# Patient Record
Sex: Male | Born: 1985 | Race: White | Hispanic: No | Smoking: Former smoker
Health system: Southern US, Community
[De-identification: ages and names within clinical notes are randomized; demographics above are authoritative.]

## PROBLEM LIST (undated history)

## (undated) DIAGNOSIS — G4700 Insomnia, unspecified: Secondary | ICD-10-CM

## (undated) DIAGNOSIS — G43909 Migraine, unspecified, not intractable, without status migrainosus: Secondary | ICD-10-CM

## (undated) DIAGNOSIS — E785 Hyperlipidemia, unspecified: Secondary | ICD-10-CM

## (undated) DIAGNOSIS — F1020 Alcohol dependence, uncomplicated: Secondary | ICD-10-CM

## (undated) HISTORY — PX: INNER EAR SURGERY: SHX679

## (undated) HISTORY — PX: TONSILLECTOMY: SUR1361

---
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
## 2007-07-20 ENCOUNTER — Emergency Department (HOSPITAL_COMMUNITY): Admission: EM | Admit: 2007-07-20 | Discharge: 2007-07-20 | Payer: Self-pay | Admitting: Emergency Medicine

## 2008-07-02 ENCOUNTER — Encounter: Admission: RE | Admit: 2008-07-02 | Discharge: 2008-07-02 | Payer: Self-pay | Admitting: Ophthalmology

## 2008-07-04 ENCOUNTER — Emergency Department (HOSPITAL_COMMUNITY): Admission: EM | Admit: 2008-07-04 | Discharge: 2008-07-04 | Payer: Self-pay | Admitting: Emergency Medicine

## 2009-11-28 ENCOUNTER — Emergency Department (HOSPITAL_COMMUNITY): Admission: EM | Admit: 2009-11-28 | Discharge: 2009-11-28 | Payer: Self-pay | Admitting: Emergency Medicine

## 2010-12-21 ENCOUNTER — Emergency Department (HOSPITAL_COMMUNITY)
Admission: EM | Admit: 2010-12-21 | Discharge: 2010-12-21 | Disposition: A | Discharge status: Home / Self Care | Payer: Self-pay | Attending: Emergency Medicine | Admitting: Emergency Medicine

## 2010-12-21 DIAGNOSIS — K089 Disorder of teeth and supporting structures, unspecified: Secondary | ICD-10-CM | POA: Insufficient documentation

## 2010-12-21 DIAGNOSIS — K047 Periapical abscess without sinus: Secondary | ICD-10-CM | POA: Insufficient documentation

## 2011-12-14 ENCOUNTER — Emergency Department (HOSPITAL_COMMUNITY)
Admission: EM | Admit: 2011-12-14 | Discharge: 2011-12-14 | Disposition: A | Discharge status: Home / Self Care | Payer: Self-pay | Attending: Emergency Medicine | Admitting: Emergency Medicine

## 2011-12-14 ENCOUNTER — Encounter (HOSPITAL_COMMUNITY): Payer: Self-pay | Admitting: Emergency Medicine

## 2011-12-14 DIAGNOSIS — R1011 Right upper quadrant pain: Secondary | ICD-10-CM | POA: Insufficient documentation

## 2011-12-14 DIAGNOSIS — R197 Diarrhea, unspecified: Secondary | ICD-10-CM | POA: Insufficient documentation

## 2011-12-14 DIAGNOSIS — R112 Nausea with vomiting, unspecified: Secondary | ICD-10-CM | POA: Insufficient documentation

## 2011-12-14 LAB — COMPREHENSIVE METABOLIC PANEL
Albumin: 4.9 g/dL (ref 3.5–5.2)
Calcium: 9.5 mg/dL (ref 8.4–10.5)
Creatinine, Ser: 0.8 mg/dL (ref 0.50–1.35)
GFR calc non Af Amer: >90 mL/min (ref >90)
Glucose, Bld: 68 mg/dL — ABNORMAL LOW (ref 70–99)
Sodium: 138 mEq/L (ref 135–145)

## 2011-12-14 LAB — DIFFERENTIAL
Basophils Absolute: 0 10*3/uL (ref 0.0–0.1)
Basophils Relative: 0 % (ref 0–1)
Lymphocytes Relative: 37 % (ref 12–46)
Monocytes Relative: 7 % (ref 3–12)
Neutro Abs: 4.9 10*3/uL (ref 1.7–7.7)

## 2011-12-14 LAB — CBC
HCT: 48.7 % (ref 39.0–52.0)
Hemoglobin: 16.8 g/dL (ref 13.0–17.0)
RBC: 5.37 MIL/uL (ref 4.22–5.81)
RDW: 12.8 % (ref 11.5–15.5)
WBC: 9.1 10*3/uL (ref 4.0–10.5)

## 2011-12-14 MED ORDER — SODIUM CHLORIDE 0.9 % IV BOLUS (SEPSIS)
1000.0000 mL | Freq: Once | INTRAVENOUS | Status: AC
  Administered 2011-12-14: 1000 mL via INTRAVENOUS

## 2011-12-14 MED ORDER — MORPHINE SULFATE 4 MG/ML IJ SOLN
4.0000 mg | Freq: Once | INTRAMUSCULAR | Status: AC
  Administered 2011-12-14: 4 mg via INTRAVENOUS
  Filled 2011-12-14: qty 1

## 2011-12-14 MED ORDER — ONDANSETRON HCL 4 MG/2ML IJ SOLN
4.0000 mg | Freq: Once | INTRAMUSCULAR | Status: AC
  Administered 2011-12-14: 4 mg via INTRAVENOUS
  Filled 2011-12-14: qty 2

## 2011-12-14 MED ORDER — HYDROCODONE-ACETAMINOPHEN 5-325 MG PO TABS: 2.0000 | ORAL_TABLET | ORAL | Status: AC | PRN

## 2011-12-14 MED ORDER — PROMETHAZINE HCL 25 MG PO TABS: 25.0000 mg | ORAL_TABLET | Freq: Four times a day (QID) | ORAL | Status: DC | PRN

## 2011-12-14 NOTE — ED Notes (Signed)
Pt presenting to ed with c/o right side abdominal pain. Pt states nausea and vomiting x 3 weeks. Pt states intermittent diarrhea. Pt is alert and oriented at this time.

## 2011-12-14 NOTE — ED Notes (Signed)
Pt. With nausea and vomiting on and off for three weeks.  Pt. Started today with sharp pain on the right upper abdominal area.  Pt. Reports intermittent diarrhea.  Reports vomit is whitish in color.

## 2011-12-14 NOTE — ED Provider Notes (Signed)
History     CSN: [id number]  Arrival date & time 12/14/11  1539   First MD Initiated Contact with Patient 12/14/11 1549      Chief Complaint  Patient presents with  . Abdominal Pain    (Consider location/radiation/quality/duration/timing/severity/associated sxs/prior treatment) HPI Comments: Patient reports that he has had intermittent vomiting over the past 2-3 weeks.  He also reports intermittent diarrhea over the past 2-3 weeks.  He denies any blood in his emesis or blood in his stool.  He reports that the vomiting is typically associated with eating.  Today he began having RUQ pain.  Pain does not radiate and is becoming progressively worse.  He reports that he has never had pain like this before.  He does have a family history of gallbladder disease. He also reports that he drinks approximately 24 cans of beer/week.   Patient is a 26 y.o. male presenting with abdominal pain. The history is provided by the patient.  Abdominal Pain The primary symptoms of the illness include abdominal pain, nausea, vomiting and diarrhea. The primary symptoms of the illness do not include fever, shortness of breath, hematemesis or dysuria. The onset of the illness was gradual.  Symptoms associated with the illness do not include chills, diaphoresis, constipation, hematuria or back pain. Significant associated medical issues do not include PUD, GERD, inflammatory bowel disease, gallstones or liver disease.    History reviewed. No pertinent past medical history.  Past Surgical History  Procedure Date  . Tonsillectomy   . Inner ear surgery     No family history on file.  History  Substance Use Topics  . Smoking status: Never Smoker   . Smokeless tobacco: Not on file  . Alcohol Use: 14.4 oz/week    24 Cans of beer per week     weekly      Review of Systems  Constitutional: Negative for fever, chills, diaphoresis and appetite change.  Respiratory: Negative for shortness of breath.     Cardiovascular: Negative for chest pain.  Gastrointestinal: Positive for nausea, vomiting, abdominal pain and diarrhea. Negative for constipation, blood in stool, abdominal distention and hematemesis.  Genitourinary: Negative for dysuria, hematuria, decreased urine volume, difficulty urinating and testicular pain.  Musculoskeletal: Negative for back pain.  Neurological: Negative for dizziness, syncope and light-headedness.    Allergies  Review of patient's allergies indicates no known allergies.  Home Medications   Current Outpatient Rx  Name Route Sig Dispense Refill  . IBUPROFEN 200 MG PO TABS Oral Take 800 mg by mouth every 6 (six) hours as needed.      BP 139/68  Pulse 73  Temp(Src) 97.8 F (36.6 C) (Oral)  Resp 20  SpO2 99%  Physical Exam  Nursing note and vitals reviewed. Constitutional: He appears well-developed and well-nourished. No distress.  HENT:  Head: Normocephalic and atraumatic.  Mouth/Throat: Oropharynx is clear and moist.  Cardiovascular: Normal rate, regular rhythm and normal heart sounds.   Pulmonary/Chest: Effort normal and breath sounds normal. No respiratory distress. He has no wheezes. He has no rales.  Abdominal: Soft. Bowel sounds are normal. He exhibits no mass. There is no hepatomegaly. There is tenderness in the right upper quadrant. There is no rigidity, no rebound, no guarding, no tenderness at McBurney's point and negative Murphy's sign.  Neurological: He is alert.  Skin: Skin is warm and dry. He is not diaphoretic.  Psychiatric: He has a normal mood and affect.    ED Course  Procedures (including critical care time)  Labs Reviewed  COMPREHENSIVE METABOLIC PANEL  LIPASE, BLOOD  CBC  DIFFERENTIAL   No results found.   No diagnosis found.  5:32 PM Reassessed patient.  Patient reports that his nausea has improved and that his pain has improved somewhat.  Patient able to tolerate po liquids.  MDM  Patient comes in today with RUQ  abdominal pain associated with intermittent nausea, vomiting, and diarrhea for the past 2-3 weeks.  Lipase, LFT's, and alk phosphate are all normal.  Afebrile.  Negative Murphy's sign.  Normal WBC.  Therefore, do not feel that any imaging is indicated at this time.  Patient able to tolerate po liquids.  Patient discharged home with antiemetic and pain medications.  Patient instructed to follow up with GI.        Pascal Lux Woody, PA-C 12/15/11 1514

## 2011-12-14 NOTE — Discharge Instructions (Signed)
Follow up with your primary care doctor about your hospital visit. Continue to hydrate orally.Take all medications as prescribed & use Phenergan as directed for nausea & vomiting.  Read the instructions below for reasons to return to the ER.   The 'BRAT' diet is suggested, then progress to diet as tolerated as symptoms abate. Call if bloody stools, persistent diarrhea, vomiting, fever or abdominal pain. Bananas.  Rice.  Applesauce.  Toast (and other simple starches such as crackers, potatoes, noodles).   SEEK IMMEDIATE MEDICAL ATTENTION IF:  You begin having localized abdominal pain that does not go away or becomes severe (The right side could  possibly be appendicitis. In an adult, the left lower portion of the abdomen could be colitis or diverticulitis)   A temperature above 101 develops  Repeated vomiting occurs (multiple uncontrollable episodes) or you are unable to keep fluids down  Blood is being passed in stools or vomit (bright red or black tarry stools).   Return also if you develop chest pain, difficulty breathing, dizziness or fainting, or become confused, poorly responsive, or inconsolable (young children).   RESOURCE GUIDE  Dental Problems  Patients with Medicaid: [facility] [phone] [address]                                           [phone] [address] Phone:  [phone]                                                  Phone:  [phone]  If unable to pay or uninsured, contact:  [facility] or [facility]. to become qualified for the adult dental clinic.  Chronic Pain Problems Contact [facility]  [phone] Patients need to be referred by their primary care doctor.  Insufficient Money for Medicine Contact [employer]:  call "211" or [facility] [phone].  No Primary Care Doctor Call [facility]  [phone] Other agencies that provide inexpensive medical care  [facility]  [phone]    [facility]  [phone]    [facility]  [phone]    [facility]  [phone]    [facility]  [phone]    [facility]  [phone]  Psychological Services [facility]  [phone] [facility]  [phone] [facility]   [phone] (emergency services [phone])  Substance Abuse Resources [facility]  3063510020 [facility] [phone] [place] [phone] Floydene Flock [phone] Residential & Outpatient Substance Abuse Program  [phone]  Abuse/Neglect [facility] Child Abuse Hotline [phone] [facility] Child Abuse Hotline 5623845769 (After Hours)  Emergency Shelter [facility] Minist[facility] [phone]  Maternity Homes [facility] [place] [facility] [phone] [place] [facility] [phone]  MRSA Hotline #:   814-399-9605    [facility]  Health Dept. [address]                        [address]      [address]  Blondell Reveal Phone:  [phone]                                   Phone:  [phone]                 Phone:  [phone]  [facility] Phone:  [phone]  [facility] Child Abuse Hotline 320 123 6647 [phone] (After Hours)  Follow up with the Gastroenterologist  listed above for further evaluation of your abdominal pain. Only use your pain medication for severe pain. Do not operate heavy machinery while on pain medication or muscle relaxer. Note that your pain medication contains acetaminophen (Tylenol) & its is not recommended that you use additional acetaminophen (Tylenol) while taking this medication.   Abdominal Pain  Your  exam might not show the exact reason you have abdominal pain. Since there are many different causes of abdominal pain, another checkup and more tests may be needed. It is very important to follow up for lasting (persistent) or worsening symptoms. A possible cause of abdominal pain in any person who still has his or her appendix is acute appendicitis. Appendicitis is often hard to diagnose. Normal blood tests, urine tests, ultrasound, and CT scans do not completely rule out early appendicitis or other causes of abdominal pain. Sometimes, only the changes that happen over time will allow appendicitis and other causes of abdominal pain to be determined. Other potential problems that may require surgery may also take time to become more apparent. Because of this, it is important that you follow all of the instructions below.   HOME CARE INSTRUCTIONS  Do not take laxatives unless directed by your caregiver. Rest as much as possible.  Do not eat solid food until your pain is gone: A diet of water, weak decaffeinated tea, broth or bouillon, gelatin, oral rehydration solutions (ORS), frozen ice pops, or ice chips may be helpful.  When pain is gone: Start a light diet (dry toast, crackers, applesauce, or white rice). Increase the diet slowly as long as it does not bother you. Eat no dairy products (including cheese and eggs) and no spicy, fatty, fried, or high-fiber foods.  Use no alcohol, caffeine, or cigarettes.  Take your regular medicines unless your caregiver told you not to.  Take any prescribed medicine as directed.   SEEK IMMEDIATE MEDICAL CARE IF:  The pain does not go away.  You have a fever >101 that persists You keep throwing up (vomiting) or cannot drink liquids.  The pain becomes localized (Pain in the right side could possibly be appendicitis. In an adult, pain in the left lower portion of the abdomen could be colitis or diverticulitis). You pass bloody or black tarry stools.  You have shaking  chills.  There is blood in your vomit or you see blood in your bowel movements.  Your bowel movements stop (become blocked) or you cannot pass gas.  You have bloody, frequent, or painful urination.  You have yellow discoloration in the skin or whites of the eyes.  Your stomach becomes bloated or bigger.  You have dizziness or fainting.  You have chest or back pain.

## 2011-12-15 ENCOUNTER — Telehealth: Payer: Self-pay | Admitting: Gastroenterology

## 2011-12-15 ENCOUNTER — Ambulatory Visit: Payer: Self-pay | Admitting: Nurse Practitioner

## 2011-12-15 NOTE — ED Provider Notes (Signed)
History/physical exam/procedure(s) were performed by non-physician practitioner and as supervising physician I was immediately available for consultation/collaboration. I have reviewed all notes and am in agreement with care and plan.   Hilario Quarry, MD 12/15/11 [phone]

## 2011-12-15 NOTE — Telephone Encounter (Signed)
The pt was seen at [facility] and [facility] is on call he will call and make appt with that office.  The pt has no money and no insurance.

## 2011-12-20 ENCOUNTER — Emergency Department (HOSPITAL_COMMUNITY): Payer: Self-pay

## 2011-12-20 ENCOUNTER — Emergency Department (HOSPITAL_COMMUNITY)
Admission: EM | Admit: 2011-12-20 | Discharge: 2011-12-20 | Disposition: A | Discharge status: Home / Self Care | Payer: Self-pay | Attending: Emergency Medicine | Admitting: Emergency Medicine

## 2011-12-20 ENCOUNTER — Encounter (HOSPITAL_COMMUNITY): Payer: Self-pay | Admitting: *Deleted

## 2011-12-20 DIAGNOSIS — R197 Diarrhea, unspecified: Secondary | ICD-10-CM | POA: Insufficient documentation

## 2011-12-20 DIAGNOSIS — R112 Nausea with vomiting, unspecified: Secondary | ICD-10-CM | POA: Insufficient documentation

## 2011-12-20 DIAGNOSIS — R1011 Right upper quadrant pain: Secondary | ICD-10-CM | POA: Insufficient documentation

## 2011-12-20 LAB — URINALYSIS, ROUTINE W REFLEX MICROSCOPIC
Glucose, UA: NEGATIVE mg/dL
Protein, ur: NEGATIVE mg/dL
Specific Gravity, Urine: 1.018 (ref 1.005–1.030)
pH: 7.5 (ref 5.0–8.0)

## 2011-12-20 LAB — BASIC METABOLIC PANEL
BUN: 10 mg/dL (ref 6–23)
CO2: 25 mEq/L (ref 19–32)
Calcium: 9.5 mg/dL (ref 8.4–10.5)
Creatinine, Ser: 0.74 mg/dL (ref 0.50–1.35)
Glucose, Bld: 86 mg/dL (ref 70–99)
Potassium: 3.9 mEq/L (ref 3.5–5.1)
Sodium: 139 mEq/L (ref 135–145)

## 2011-12-20 LAB — CBC
HCT: 46.2 % (ref 39.0–52.0)
MCH: 30.6 pg (ref 26.0–34.0)
MCHC: 33.5 g/dL (ref 30.0–36.0)
MCV: 91.3 fL (ref 78.0–100.0)
Platelets: 266 10*3/uL (ref 150–400)

## 2011-12-20 MED ORDER — MORPHINE SULFATE 4 MG/ML IJ SOLN
4.0000 mg | Freq: Once | INTRAMUSCULAR | Status: AC
  Administered 2011-12-20: 4 mg via INTRAVENOUS
  Filled 2011-12-20: qty 1

## 2011-12-20 MED ORDER — DICYCLOMINE HCL 20 MG PO TABS: 20.0000 mg | ORAL_TABLET | Freq: Two times a day (BID) | ORAL | Status: DC

## 2011-12-20 MED ORDER — SODIUM CHLORIDE 0.9 % IV BOLUS (SEPSIS)
1000.0000 mL | Freq: Once | INTRAVENOUS | Status: AC
  Administered 2011-12-20: 1000 mL via INTRAVENOUS

## 2011-12-20 MED ORDER — ONDANSETRON HCL 4 MG/2ML IJ SOLN
4.0000 mg | Freq: Once | INTRAMUSCULAR | Status: AC
  Administered 2011-12-20: 4 mg via INTRAVENOUS
  Filled 2011-12-20: qty 2

## 2011-12-20 MED ORDER — DICYCLOMINE HCL 10 MG PO CAPS
10.0000 mg | ORAL_CAPSULE | Freq: Once | ORAL | Status: AC
  Administered 2011-12-20: 10 mg via ORAL
  Filled 2011-12-20: qty 1

## 2011-12-20 MED ORDER — LANSOPRAZOLE 15 MG PO CPDR: 15.0000 mg | DELAYED_RELEASE_CAPSULE | Freq: Every day | ORAL | Status: DC

## 2011-12-20 NOTE — Discharge Instructions (Signed)

## 2011-12-20 NOTE — ED Notes (Signed)
Patient transported to Ultrasound 

## 2011-12-20 NOTE — ED Notes (Signed)
Pt reports abd pain with n/v/d, was seen here for same x 1 week ago.  Pt reports he was given a referral to see a GI but is not able to d/t financial issues.  Pt reports pain is getting worse.

## 2011-12-20 NOTE — ED Provider Notes (Signed)
History     CSN: [id number]  Arrival date & time 12/20/11  5284   First MD Initiated Contact with Patient 12/20/11 1028      Chief Complaint  Patient presents with  . Abdominal Pain    n/v/d    (Consider location/radiation/quality/duration/timing/severity/associated sxs/prior treatment) Patient is a 26 y.o. male presenting with abdominal pain.  Abdominal Pain The primary symptoms of the illness include abdominal pain, nausea, vomiting and diarrhea. The primary symptoms of the illness do not include fever, fatigue, dysuria or vaginal discharge. The current episode started more than 2 days ago.    Patient to the ED with complaints of abdominal pain for 1 month. The patient was seen in ED last week for this problem. He was told that all of his labs were fine and given a referral to a GI doctor but [facility] wanted 500 upfront to be seen and he can not afford to pay that. He reports that his pain is gradually getting worse since last week and that the Vicodin and phenergan did not help. The pain is RUQ and radiated towards the epigastrium.     History reviewed. No pertinent past medical history.  Past Surgical History  Procedure Date  . Tonsillectomy   . Inner ear surgery     No family history on file.  History  Substance Use Topics  . Smoking status: Never Smoker   . Smokeless tobacco: Not on file  . Alcohol Use: 14.4 oz/week    24 Cans of beer per week     weekly      Review of Systems  Constitutional: Negative for fever and fatigue.  Gastrointestinal: Positive for nausea, vomiting, abdominal pain and diarrhea.  Genitourinary: Negative for dysuria and vaginal discharge.    Allergies  Review of patient's allergies indicates no known allergies.  Home Medications   Current Outpatient Rx  Name Route Sig Dispense Refill  . HYDROCODONE-ACETAMINOPHEN 5-325 MG PO TABS Oral Take 2 tablets by mouth every 4 (four) hours as needed for pain. 15 tablet 0  . IBUPROFEN 200 MG  PO TABS Oral Take 800 mg by mouth every 6 (six) hours as needed.    Marland Kitchen PROMETHAZINE HCL 25 MG PO TABS Oral Take 1 tablet (25 mg total) by mouth every 6 (six) hours as needed for nausea. 20 tablet 0  . DICYCLOMINE HCL 20 MG PO TABS Oral Take 1 tablet (20 mg total) by mouth 2 (two) times daily. 20 tablet 0  . LANSOPRAZOLE 15 MG PO CPDR Oral Take 1 capsule (15 mg total) by mouth daily. 30 capsule 0    BP 133/76  Pulse 65  Temp(Src) 97.9 F (36.6 C) (Oral)  Resp 15  SpO2 100%  Physical Exam  Nursing note and vitals reviewed. Constitutional: He appears well-developed and well-nourished. No distress.  HENT:  Head: Normocephalic and atraumatic.  Eyes: Pupils are equal, round, and reactive to light.  Neck: Normal range of motion. Neck supple.  Cardiovascular: Normal rate and regular rhythm.   Pulmonary/Chest: Effort normal.  Abdominal: Soft. He exhibits no distension. There is tenderness (RUQ pain). There is no rebound and no guarding.  Neurological: He is alert.  Skin: Skin is warm and dry.    ED Course  Procedures (including critical care time)   Labs Reviewed  URINALYSIS, ROUTINE W REFLEX MICROSCOPIC  CBC  BASIC METABOLIC PANEL  LIPASE, BLOOD   US Abdomen Complete  12/20/2011  *RADIOLOGY REPORT*  Clinical Data:  Right upper quadrant abdominal  pain.  COMPLETE ABDOMINAL ULTRASOUND  Comparison:  None  Findings:  Gallbladder: Well distended without wall thickening, stones or pericholecystic fluid. Negative sonographic Murphy's sign.  Common bile duct:   Normal in caliber without filling defects.  Liver:  Echogenicity is within normal limits.  No focal hepatic abnormalities are identified.  IVC:  Visualized portions appear unremarkable.  Pancreas:  Visualized portions appear unremarkable.  Spleen:  Visualized portions appear unremarkable.  Right Kidney:   The renal cortical thickness and echogenicity are preserved.  There is no hydronephrosis or focal abnormality. Renal length is 10.6 cm.   Left Kidney:   The renal cortical thickness and echogenicity are preserved.  There is no hydronephrosis or focal abnormality. Renal length is 10.5 cm.  Abdominal aorta:  Visualized portions appear unremarkable.  IMPRESSION: Normal abdominal ultrasound.  Original Report Authenticated By: Gerrianne Scale, M.D.     1. Abdominal pain       MDM  Patient labs and abdominal US have all come back normal with no abnormalities. Pt given bentyl and prevacid Rx. Pt also given another referral to GI and encouraged to try to schedule an appointment. Pt in NAD distress. I have re-examined abd and he is still mildly tender but no acute abdomen.  Pt has been advised of the symptoms that warrant their return to the ED. Patient has voiced understanding and has agreed to follow-up with the PCP or specialist.         Dorthula Matas, PA 12/20/11 1306

## 2011-12-23 NOTE — ED Provider Notes (Signed)
History/physical exam/procedure(s) were performed by non-physician practitioner and as supervising physician I was immediately available for consultation/collaboration. I have reviewed all notes and am in agreement with care and plan.   Babbette Dalesandro S Nolan Tuazon, MD 12/23/11 0743 

## 2012-09-28 ENCOUNTER — Encounter (HOSPITAL_COMMUNITY): Payer: Self-pay | Admitting: Emergency Medicine

## 2012-09-28 ENCOUNTER — Emergency Department (HOSPITAL_COMMUNITY): Payer: Self-pay

## 2012-09-28 ENCOUNTER — Emergency Department (HOSPITAL_COMMUNITY)
Admission: EM | Admit: 2012-09-28 | Discharge: 2012-09-28 | Disposition: A | Discharge status: Home / Self Care | Payer: Self-pay | Attending: Emergency Medicine | Admitting: Emergency Medicine

## 2012-09-28 DIAGNOSIS — M25512 Pain in left shoulder: Secondary | ICD-10-CM

## 2012-09-28 DIAGNOSIS — W03XXXA Other fall on same level due to collision with another person, initial encounter: Secondary | ICD-10-CM | POA: Insufficient documentation

## 2012-09-28 DIAGNOSIS — S6990XA Unspecified injury of unspecified wrist, hand and finger(s), initial encounter: Secondary | ICD-10-CM | POA: Insufficient documentation

## 2012-09-28 DIAGNOSIS — S4980XA Other specified injuries of shoulder and upper arm, unspecified arm, initial encounter: Secondary | ICD-10-CM | POA: Insufficient documentation

## 2012-09-28 DIAGNOSIS — Z8679 Personal history of other diseases of the circulatory system: Secondary | ICD-10-CM | POA: Insufficient documentation

## 2012-09-28 DIAGNOSIS — S46909A Unspecified injury of unspecified muscle, fascia and tendon at shoulder and upper arm level, unspecified arm, initial encounter: Secondary | ICD-10-CM | POA: Insufficient documentation

## 2012-09-28 DIAGNOSIS — Y9322 Activity, ice hockey: Secondary | ICD-10-CM | POA: Insufficient documentation

## 2012-09-28 DIAGNOSIS — Y9239 Other specified sports and athletic area as the place of occurrence of the external cause: Secondary | ICD-10-CM | POA: Insufficient documentation

## 2012-09-28 DIAGNOSIS — S59909A Unspecified injury of unspecified elbow, initial encounter: Secondary | ICD-10-CM | POA: Insufficient documentation

## 2012-09-28 DIAGNOSIS — F17200 Nicotine dependence, unspecified, uncomplicated: Secondary | ICD-10-CM | POA: Insufficient documentation

## 2012-09-28 HISTORY — DX: Migraine, unspecified, not intractable, without status migrainosus: G43.909

## 2012-09-28 NOTE — ED Notes (Signed)
Pt reports increased pain in l/upper arm and shoulder. Pt fell on an extended l/arm while playing hockey 2 weeks ago.

## 2012-09-28 NOTE — ED Provider Notes (Signed)
History     CSN: [id number]  Arrival date & time 09/28/12  1117   First MD Initiated Contact with Patient 09/28/12 1127      Chief Complaint  Patient presents with  . Shoulder Injury    2 week hx of sports related injury to l/shoulder  . Arm Injury    (Consider location/radiation/quality/duration/timing/severity/associated sxs/prior treatment) HPI Comments: Patient presents today with a chief complaint of pain of the left shoulder.  Pain located along the lateral portion of the shoulder.  He reports that he began having pain two weeks ago after he fell on his arm while playing hockey.  No improvement in the pain since injury.  He has not taken anything for pain or tried any treatment prior to arrival. Pain worse with abduction of the arm..  No prior injury to that area.  No pain over the clavicle.  He denies numbness or tingling.  Denies erythema or swelling of the shoulder.  The history is provided by the patient.    Past Medical History  Diagnosis Date  . Migraine     Past Surgical History  Procedure Laterality Date  . Tonsillectomy    . Inner ear surgery      Family History  Problem Relation Age of Onset  . Diabetes Mother   . Hypertension Mother   . Asthma Mother   . Cancer Father   . Stroke Father   . Thyroid disease Father     History  Substance Use Topics  . Smoking status: Current Every Day Smoker    Types: Cigarettes  . Smokeless tobacco: Not on file  . Alcohol Use: 14.4 oz/week    24 Cans of beer per week     Comment: weekly      Review of Systems  Constitutional: Negative for fever and chills.  Musculoskeletal:       Left shoulder pain  Skin: Negative for color change and wound.  Neurological: Negative for numbness.    Allergies  Review of patient's allergies indicates no known allergies.  Home Medications  No current outpatient prescriptions on file.  BP 143/72  Pulse 74  Temp(Src) 98.3 F (36.8 C) (Oral)  Resp 18  Ht 5\' 11"  (1.803  m)  Wt 192 lb (87.091 kg)  BMI 26.79 kg/m2  SpO2 100%  Physical Exam  Nursing note and vitals reviewed. Constitutional: He appears well-developed and well-nourished. No distress.  HENT:  Head: Normocephalic and atraumatic.  Mouth/Throat: Oropharynx is clear and moist.  Cardiovascular: Normal rate, regular rhythm, normal heart sounds and intact distal pulses.   Pulses:      Dorsalis pedis pulses are 2+ on the right side, and 2+ on the left side.  Pulmonary/Chest: Effort normal and breath sounds normal.  Musculoskeletal:       Left shoulder: He exhibits tenderness. He exhibits normal range of motion, no swelling, no deformity, normal pulse and normal strength.       Left elbow: He exhibits normal range of motion, no swelling, no effusion and no deformity. No tenderness found.       Left wrist: He exhibits normal range of motion, no tenderness, no bony tenderness, no swelling and no deformity.  Increased pain with abduction of the left shoulder No tenderness to palpation of the clavicle.  Neurological: No sensory deficit.  Skin: He is not diaphoretic.    ED Course  Procedures (including critical care time)  Labs Reviewed - No data to display Dg Shoulder Left  09/28/2012  *  RADIOLOGY REPORT*  Clinical Data: Shoulder injury.  LEFT SHOULDER - 2+ VIEW  Comparison: None  Findings: There is no evidence of fracture or dislocation.  There is no evidence of arthropathy or other focal bone abnormality. Soft tissues are unremarkable.  IMPRESSION: Normal exam.   Original Report Authenticated By: Signa Kell, M.D.      No diagnosis found.    MDM  Patient presenting with shoulder injury.  Negative Xray.  Neurovascularly intact.  Patient given prescription for Tramadol and instructed to follow up with Orthopedics if pain persists.        Pascal Lux [place]wood, PA-C 09/28/12 [phone]

## 2012-09-29 NOTE — ED Provider Notes (Signed)
Medical screening examination/treatment/procedure(s) were performed by non-physician practitioner and as supervising physician I was immediately available for consultation/collaboration.  Linsey Hirota, MD 09/29/12 0659 

## 2013-03-13 ENCOUNTER — Encounter (HOSPITAL_COMMUNITY): Payer: Self-pay

## 2013-03-13 ENCOUNTER — Emergency Department (HOSPITAL_COMMUNITY)
Admission: EM | Admit: 2013-03-13 | Discharge: 2013-03-13 | Disposition: A | Discharge status: Home / Self Care | Payer: Self-pay | Attending: Emergency Medicine | Admitting: Emergency Medicine

## 2013-03-13 ENCOUNTER — Emergency Department (HOSPITAL_COMMUNITY): Payer: Self-pay

## 2013-03-13 DIAGNOSIS — F17200 Nicotine dependence, unspecified, uncomplicated: Secondary | ICD-10-CM | POA: Insufficient documentation

## 2013-03-13 DIAGNOSIS — R0609 Other forms of dyspnea: Secondary | ICD-10-CM | POA: Insufficient documentation

## 2013-03-13 DIAGNOSIS — Z8679 Personal history of other diseases of the circulatory system: Secondary | ICD-10-CM | POA: Insufficient documentation

## 2013-03-13 DIAGNOSIS — S20211A Contusion of right front wall of thorax, initial encounter: Secondary | ICD-10-CM

## 2013-03-13 DIAGNOSIS — Y9239 Other specified sports and athletic area as the place of occurrence of the external cause: Secondary | ICD-10-CM | POA: Insufficient documentation

## 2013-03-13 DIAGNOSIS — R0989 Other specified symptoms and signs involving the circulatory and respiratory systems: Secondary | ICD-10-CM | POA: Insufficient documentation

## 2013-03-13 DIAGNOSIS — Y9365 Activity, lacrosse and field hockey: Secondary | ICD-10-CM | POA: Insufficient documentation

## 2013-03-13 DIAGNOSIS — W219XXA Striking against or struck by unspecified sports equipment, initial encounter: Secondary | ICD-10-CM | POA: Insufficient documentation

## 2013-03-13 DIAGNOSIS — Y92838 Other recreation area as the place of occurrence of the external cause: Secondary | ICD-10-CM | POA: Insufficient documentation

## 2013-03-13 DIAGNOSIS — S20219A Contusion of unspecified front wall of thorax, initial encounter: Secondary | ICD-10-CM | POA: Insufficient documentation

## 2013-03-13 MED ORDER — IBUPROFEN 600 MG PO TABS: 600.0000 mg | ORAL_TABLET | Freq: Four times a day (QID) | ORAL | Status: DC | PRN

## 2013-03-13 MED ORDER — METHOCARBAMOL 500 MG PO TABS: 500.0000 mg | ORAL_TABLET | Freq: Two times a day (BID) | ORAL | Status: DC

## 2013-03-13 MED ORDER — DIAZEPAM 2 MG PO TABS: 2.0000 mg | ORAL_TABLET | Freq: Once | ORAL | Status: DC

## 2013-03-13 MED ORDER — HYDROMORPHONE HCL PF 2 MG/ML IJ SOLN
2.0000 mg | Freq: Once | INTRAMUSCULAR | Status: AC
  Administered 2013-03-13: 2 mg via INTRAMUSCULAR
  Filled 2013-03-13: qty 1

## 2013-03-13 MED ORDER — IBUPROFEN 200 MG PO TABS
600.0000 mg | ORAL_TABLET | Freq: Once | ORAL | Status: AC
  Administered 2013-03-13: 600 mg via ORAL
  Filled 2013-03-13: qty 3

## 2013-03-13 MED ORDER — HYDROCODONE-ACETAMINOPHEN 5-325 MG PO TABS: 1.0000 | ORAL_TABLET | Freq: Four times a day (QID) | ORAL | Status: DC | PRN

## 2013-03-13 NOTE — Progress Notes (Signed)
[facility] provided patient with a [employer], notarized letter form for [employer], and a list of primary care resources.

## 2013-03-13 NOTE — ED Notes (Signed)
Pt states playing hockey last pm, hit with hard dense plastic hockey puck to rt side, states knocked the breathe out of him, pt c/o pain on breathing, pt in no distress, speaking in complete sentences
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
## 2013-03-13 NOTE — ED Notes (Signed)
Bed: WA04 Expected date:  Expected time:  Means of arrival:  Comments: 

## 2013-06-01 ENCOUNTER — Emergency Department (HOSPITAL_COMMUNITY)
Admission: EM | Admit: 2013-06-01 | Discharge: 2013-06-01 | Disposition: A | Discharge status: Home / Self Care | Payer: Self-pay | Attending: Emergency Medicine | Admitting: Emergency Medicine

## 2013-06-01 ENCOUNTER — Encounter (HOSPITAL_COMMUNITY): Payer: Self-pay | Admitting: Emergency Medicine

## 2013-06-01 ENCOUNTER — Emergency Department (HOSPITAL_COMMUNITY): Payer: Self-pay

## 2013-06-01 DIAGNOSIS — Y9389 Activity, other specified: Secondary | ICD-10-CM | POA: Insufficient documentation

## 2013-06-01 DIAGNOSIS — Z8679 Personal history of other diseases of the circulatory system: Secondary | ICD-10-CM | POA: Insufficient documentation

## 2013-06-01 DIAGNOSIS — IMO0002 Reserved for concepts with insufficient information to code with codable children: Secondary | ICD-10-CM | POA: Insufficient documentation

## 2013-06-01 DIAGNOSIS — Y92410 Unspecified street and highway as the place of occurrence of the external cause: Secondary | ICD-10-CM | POA: Insufficient documentation

## 2013-06-01 DIAGNOSIS — M2550 Pain in unspecified joint: Secondary | ICD-10-CM

## 2013-06-01 DIAGNOSIS — F17200 Nicotine dependence, unspecified, uncomplicated: Secondary | ICD-10-CM | POA: Insufficient documentation

## 2013-06-01 DIAGNOSIS — T07XXXA Unspecified multiple injuries, initial encounter: Secondary | ICD-10-CM

## 2013-06-01 MED ORDER — HYDROCODONE-ACETAMINOPHEN 5-325 MG PO TABS
2.0000 | ORAL_TABLET | Freq: Once | ORAL | Status: AC
  Administered 2013-06-01: 2 via ORAL
  Filled 2013-06-01: qty 2

## 2013-06-01 MED ORDER — METHOCARBAMOL 750 MG PO TABS: 750.0000 mg | ORAL_TABLET | Freq: Four times a day (QID) | ORAL | Status: DC | PRN

## 2013-06-01 MED ORDER — HYDROCODONE-ACETAMINOPHEN 5-325 MG PO TABS: 1.0000 | ORAL_TABLET | Freq: Four times a day (QID) | ORAL | Status: DC | PRN

## 2013-06-01 MED ORDER — IBUPROFEN 800 MG PO TABS: 800.0000 mg | ORAL_TABLET | Freq: Three times a day (TID) | ORAL | Status: DC | PRN
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
## 2013-06-01 NOTE — ED Notes (Signed)
Cleaned rt knee and placed wet dressing to abrasion.

## 2013-06-02 NOTE — ED Provider Notes (Signed)
Medical screening examination/treatment/procedure(s) were performed by non-physician practitioner and as supervising physician I was immediately available for consultation/collaboration.  EKG Interpretation   None         Junius Argyle, MD 06/02/13 1523

## 2013-08-16 ENCOUNTER — Emergency Department (HOSPITAL_COMMUNITY)
Admission: EM | Admit: 2013-08-16 | Discharge: 2013-08-16 | Disposition: A | Discharge status: Home / Self Care | Payer: Self-pay | Attending: Emergency Medicine | Admitting: Emergency Medicine

## 2013-08-16 DIAGNOSIS — R1110 Vomiting, unspecified: Secondary | ICD-10-CM | POA: Insufficient documentation

## 2013-08-16 DIAGNOSIS — H6091 Unspecified otitis externa, right ear: Secondary | ICD-10-CM

## 2013-08-16 DIAGNOSIS — H60399 Other infective otitis externa, unspecified ear: Secondary | ICD-10-CM | POA: Insufficient documentation

## 2013-08-16 DIAGNOSIS — F17200 Nicotine dependence, unspecified, uncomplicated: Secondary | ICD-10-CM | POA: Insufficient documentation

## 2013-08-16 DIAGNOSIS — Z8679 Personal history of other diseases of the circulatory system: Secondary | ICD-10-CM | POA: Insufficient documentation

## 2013-08-16 MED ORDER — NEOMYCIN-COLIST-HC-THONZONIUM 3.3-3-10-0.5 MG/ML OT SUSP
3.0000 [drp] | Freq: Once | OTIC | Status: AC
  Administered 2013-08-16: 3 [drp] via OTIC
  Filled 2013-08-16: qty 5

## 2013-08-16 MED ORDER — HYDROCODONE-ACETAMINOPHEN 5-325 MG PO TABS: 1.0000 | ORAL_TABLET | Freq: Four times a day (QID) | ORAL | Status: DC | PRN

## 2013-08-16 MED ORDER — ANTIPYRINE-BENZOCAINE 5.4-1.4 % OT SOLN
3.0000 [drp] | Freq: Once | OTIC | Status: AC
  Administered 2013-08-16: 4 [drp] via OTIC
  Filled 2013-08-16: qty 10

## 2013-08-16 NOTE — ED Provider Notes (Signed)
CSN: [id number]     Arrival date & time 08/16/13  1116 History  This chart was scribed for non-physician practitioner, Fayrene Helper, PA-C working with Celene Kras, MD by Greggory Stallion, ED scribe. This patient was seen in room WTR9/WTR9 and the patient's care was started at 12:07 PM.   Chief Complaint  Patient presents with  . Otalgia    right ear   The history is provided by the patient. No language interpreter was used.   HPI Comments: Christopher Estes is a 28 y.o. male who presents to the Emergency Department complaining of gradual onset, constant throbbing right ear pain that started 2-3 days ago. Laying down worsens the pain. He states it radiates through the whole right side of his head. Pt had two episodes of emesis this morning. He has taken 600 mg ibuprofen with no relief. Pt states he had ear infections a lot as a child and had tubes placed but states he no longer has them. Denies recently swimming in lakes, ponds or pools. Denies fever, chills, ear discharge, rhinorrhea, cough, sore throat, sneezing, rash.   Past Medical History  Diagnosis Date  . Migraine    Past Surgical History  Procedure Laterality Date  . Tonsillectomy    . Inner ear surgery     Family History  Problem Relation Age of Onset  . Diabetes Mother   . Hypertension Mother   . Asthma Mother   . Cancer Father   . Stroke Father   . Thyroid disease Father    History  Substance Use Topics  . Smoking status: Current Every Day Smoker    Types: Cigarettes  . Smokeless tobacco: Not on file  . Alcohol Use: 14.4 oz/week    24 Cans of beer per week     Comment: weekly    Review of Systems  Constitutional: Negative for fever and chills.  HENT: Positive for ear pain. Negative for ear discharge, sneezing and sore throat.   Respiratory: Negative for cough.   Gastrointestinal: Positive for vomiting.  Skin: Negative for rash.  All other systems reviewed and are negative.    Allergies  Review of patient's  allergies indicates no known allergies.  Home Medications   Current Outpatient Rx  Name  Route  Sig  Dispense  Refill  . ibuprofen (ADVIL,MOTRIN) 200 MG tablet   Oral   Take 600 mg by mouth every 6 (six) hours as needed for moderate pain.          BP 155/105  Pulse 73  Temp(Src) 97.1 F (36.2 C) (Oral)  Resp 17  Ht 5\' 11"  (1.803 m)  Wt 201 lb (91.173 kg)  BMI 28.05 kg/m2  SpO2 100%  Physical Exam  Nursing note and vitals reviewed. Constitutional: He is oriented to person, place, and time. He appears well-developed and well-nourished. No distress.  HENT:  Head: Normocephalic and atraumatic.  Left Ear: Tympanic membrane and ear canal normal.  Right canal is mildly erythematous. Tenderness to right ear lobe at tragus and antitragus. Right canal inflamed and edematous. Right TM is mildly erythematous and bulging. No obvious signs of perforated TM, however, incompletely visualized due to cerumen.   Eyes: EOM are normal.  Neck: Neck supple. No tracheal deviation present.  Cardiovascular: Normal rate.   Pulmonary/Chest: Effort normal. No respiratory distress.  Musculoskeletal: Normal range of motion.  Neurological: He is alert and oriented to person, place, and time.  Skin: Skin is warm and dry.  Psychiatric: He has a  normal mood and affect. His behavior is normal.    ED Course  Procedures (including critical care time)  DIAGNOSTIC STUDIES: Oxygen Saturation is 100% on RA, normal by my interpretation.    COORDINATION OF CARE: 12:11 PM-patient with evidence of otitis externa to right ear. He did have several impaction that was removed by me. Patient tolerates well. TM appears to be intact however obscured with cerumen. Discussed treatment plan which includes an ear drop and pain medication with pt at bedside and pt agreed to plan. Advised pt there is no perforated TM. ENT referral given as needed.  Labs Review Labs Reviewed - No data to display Imaging Review No results  found.  EKG Interpretation   None       MDM   1. Otitis externa of right ear    BP 155/105  Pulse 73  Temp(Src) 97.1 F (36.2 C) (Oral)  Resp 17  Ht 5\' 11"  (1.803 m)  Wt 201 lb (91.173 kg)  BMI 28.05 kg/m2  SpO2 100%   I personally performed the services described in this documentation, which was scribed in my presence. The recorded information has been reviewed and is accurate.    Fayrene HelperBowie Hermenegildo Clausen, PA-C 08/16/13 1226

## 2013-08-16 NOTE — ED Notes (Addendum)
Pt states his right ear has hurt x3-4 days.  Pt states he had a lot of ear infections as a child and at one point had tubes placed in ears.  Pt does not currently have tubes.  Pt's right TM is red.  Pt states he has had N/V since yesterday.  Pt does not have a fever in triage.  Pt states pain in ear radiates in to his temple.  Pt rates pain 10/10 currently.

## 2013-08-16 NOTE — Discharge Instructions (Signed)
Apply 3-4 drops of cortisporin in right ear every 6 hrs for the next 7 days.  Apply 3-4 drops of auralgan in right ear every 6-8 hrs as needed for pain.   Otitis Externa Otitis externa is a germ infection in the outer ear. The outer ear is the area from the eardrum to the outside of the ear. Otitis externa is sometimes called "swimmer's ear." HOME CARE  Put drops in the ear as told by your doctor.  Only take medicine as told by your doctor.  If you have diabetes, your doctor may give you more directions. Follow your doctor's directions.  Keep all doctor visits as told. To avoid another infection:  Keep your ear dry. Use the corner of a towel to dry your ear after swimming or bathing.  Avoid scratching or putting things inside your ear.  Avoid swimming in lakes, dirty water, or pools that use a chemical called chlorine poorly.  You may use ear drops after swimming. Combine equal amounts of white vinegar and alcohol in a bottle. Put 3 or 4 drops in each ear. GET HELP RIGHT AWAY IF:   You have a fever.  Your ear is still red, puffy (swollen), or painful after 3 days.  You still have yellowish-white fluid (pus) coming from the ear after 3 days.  Your redness, puffiness, or pain gets worse.  You have a really bad headache.  You have redness, puffiness, pain, or tenderness behind your ear. MAKE SURE YOU:   Understand these instructions.  Will watch your condition.  Will get help right away if you are not doing well or get worse. Document Released: 01/03/2008 Document Revised: 10/09/2011 Document Reviewed: 08/03/2011 [facility] Patient Information 2014 [place], [place].

## 2013-08-16 NOTE — ED Provider Notes (Signed)
Medical screening examination/treatment/procedure(s) were performed by non-physician practitioner and as supervising physician I was immediately available for consultation/collaboration.    Gael Londo R Reni Hausner, MD 08/16/13 1617 

## 2013-12-13 ENCOUNTER — Emergency Department (HOSPITAL_COMMUNITY)
Admission: EM | Admit: 2013-12-13 | Discharge: 2013-12-13 | Disposition: A | Discharge status: Home / Self Care | Payer: Self-pay | Attending: Emergency Medicine | Admitting: Emergency Medicine

## 2013-12-13 ENCOUNTER — Encounter (HOSPITAL_COMMUNITY): Payer: Self-pay | Admitting: Emergency Medicine

## 2013-12-13 DIAGNOSIS — Z8679 Personal history of other diseases of the circulatory system: Secondary | ICD-10-CM | POA: Insufficient documentation

## 2013-12-13 DIAGNOSIS — J329 Chronic sinusitis, unspecified: Secondary | ICD-10-CM | POA: Insufficient documentation

## 2013-12-13 DIAGNOSIS — H6120 Impacted cerumen, unspecified ear: Secondary | ICD-10-CM | POA: Insufficient documentation

## 2013-12-13 DIAGNOSIS — R509 Fever, unspecified: Secondary | ICD-10-CM | POA: Insufficient documentation

## 2013-12-13 DIAGNOSIS — F17200 Nicotine dependence, unspecified, uncomplicated: Secondary | ICD-10-CM | POA: Insufficient documentation

## 2013-12-13 MED ORDER — KETOROLAC TROMETHAMINE 60 MG/2ML IM SOLN
60.0000 mg | Freq: Once | INTRAMUSCULAR | Status: AC
  Administered 2013-12-13: 60 mg via INTRAMUSCULAR
  Filled 2013-12-13: qty 2

## 2013-12-13 MED ORDER — AMOXICILLIN 500 MG PO CAPS: 500.0000 mg | ORAL_CAPSULE | Freq: Three times a day (TID) | ORAL | Status: DC

## 2013-12-13 NOTE — ED Notes (Signed)
Pt arrived to the Ed with a complaint of facial swelling.  Pt states that he had a sore throat that progressed to a n ear ache which now has manifested into facial swelling on the right side of his face.

## 2013-12-13 NOTE — Discharge Instructions (Signed)

## 2013-12-13 NOTE — ED Provider Notes (Signed)
CSN: [id number]     Arrival date & time 12/13/13  0404 History   First MD Initiated Contact with Patient 12/13/13 0441     Chief Complaint  Patient presents with  . Facial Swelling     (Consider location/radiation/quality/duration/timing/severity/associated sxs/prior Treatment) HPI History per patient. Left ear pain and facial pain, onset a week ago and worse tonight. Having trouble sleeping due to symptoms. No sore throat. No rash. No dental pain. No cough or difficulty breathing. Has felt warm but no measured fevers. Reports multiple ear infections as a child and did require tubes in his ears. Symptoms moderate in severity. No known alleviating factors. Trying over-the-counter medications without relief.  Past Medical History  Diagnosis Date  . Migraine    Past Surgical History  Procedure Laterality Date  . Tonsillectomy    . Inner ear surgery     Family History  Problem Relation Age of Onset  . Diabetes Mother   . Hypertension Mother   . Asthma Mother   . Cancer Father   . Stroke Father   . Thyroid disease Father    History  Substance Use Topics  . Smoking status: Current Every Day Smoker    Types: Cigarettes  . Smokeless tobacco: Not on file  . Alcohol Use: 14.4 oz/week    24 Cans of beer per week     Comment: weekly    Review of Systems  Constitutional: Positive for fever. Negative for chills.  HENT: Positive for facial swelling and sinus pressure. Negative for ear discharge, mouth sores and trouble swallowing.   Eyes: Negative for visual disturbance.  Respiratory: Negative for shortness of breath.   Cardiovascular: Negative for chest pain.  Gastrointestinal: Negative for vomiting and abdominal pain.  Genitourinary: Negative for dysuria.  Musculoskeletal: Negative for back pain.  Skin: Negative for rash.  Neurological: Negative for headaches.  All other systems reviewed and are negative.     Allergies  Review of patient's allergies indicates no known  allergies.  Home Medications   Prior to Admission medications   Medication Sig Start Date End Date Taking? Authorizing Provider  acetaminophen (TYLENOL) 325 MG tablet Take 650 mg by mouth every 6 (six) hours as needed for moderate pain.   Yes Historical Provider, MD  Pseudoeph-Doxylamine-DM-APAP (NYQUIL MULTI-SYMPTOM PO) Take 15 mLs by mouth once.   Yes Historical Provider, MD   BP 156/91  Pulse 64  Temp(Src) 98 F (36.7 C) (Oral)  Resp 20  Ht 5\' 11"  (1.803 m)  Wt 192 lb (87.091 kg)  BMI 26.79 kg/m2  SpO2 97% Physical Exam  Constitutional: He is oriented to person, place, and time. He appears well-developed and well-nourished.  HENT:  Head: Normocephalic and atraumatic.  Mouth/Throat: Oropharynx is clear and moist. No oropharyngeal exudate.  Tender over maxillary sinuses, cerumen filled ear canals unable to visualize TMs. No auricular tenderness. No trismus or dental tenderness. Uvula midline.  Eyes: EOM are normal. Pupils are equal, round, and reactive to light.  Neck: Neck supple.  Cardiovascular: Normal rate, regular rhythm and intact distal pulses.   Pulmonary/Chest: Effort normal and breath sounds normal. No respiratory distress. He exhibits no tenderness.  Abdominal: Soft. There is no tenderness.  Musculoskeletal: Normal range of motion. He exhibits no edema.  Neurological: He is alert and oriented to person, place, and time.  Skin: Skin is warm and dry.    ED Course  Procedures (including critical care time) Labs Review Labs Reviewed - No data to display  Imaging Review  No results found.  Toradol IM Prescription for Amoxicillin. Discharged home with return precautions and instructions for sinusitis. Patient encouraged to stop smoking.   MDM   Dx: sinusitis  Presents with ear pain and sinus pressure and subjective facial swelling, subjective fevers. Her exam is tender over the sinuses. Will treat for sinusitis. Vital signs and nursing notes reviewed   Sunnie NielsenBrian  Julius Matus, MD 12/13/13 [phone]

## 2014-02-12 ENCOUNTER — Emergency Department (HOSPITAL_COMMUNITY): Payer: Self-pay

## 2014-02-12 ENCOUNTER — Emergency Department (HOSPITAL_COMMUNITY)
Admission: EM | Admit: 2014-02-12 | Discharge: 2014-02-12 | Disposition: A | Discharge status: Home / Self Care | Payer: Self-pay | Attending: Emergency Medicine | Admitting: Emergency Medicine

## 2014-02-12 ENCOUNTER — Encounter (HOSPITAL_COMMUNITY): Payer: Self-pay | Admitting: Emergency Medicine

## 2014-02-12 DIAGNOSIS — X500XXA Overexertion from strenuous movement or load, initial encounter: Secondary | ICD-10-CM | POA: Insufficient documentation

## 2014-02-12 DIAGNOSIS — Y92838 Other recreation area as the place of occurrence of the external cause: Secondary | ICD-10-CM

## 2014-02-12 DIAGNOSIS — S46909A Unspecified injury of unspecified muscle, fascia and tendon at shoulder and upper arm level, unspecified arm, initial encounter: Secondary | ICD-10-CM | POA: Insufficient documentation

## 2014-02-12 DIAGNOSIS — Y9239 Other specified sports and athletic area as the place of occurrence of the external cause: Secondary | ICD-10-CM | POA: Insufficient documentation

## 2014-02-12 DIAGNOSIS — Y9364 Activity, baseball: Secondary | ICD-10-CM | POA: Insufficient documentation

## 2014-02-12 DIAGNOSIS — Z792 Long term (current) use of antibiotics: Secondary | ICD-10-CM | POA: Insufficient documentation

## 2014-02-12 DIAGNOSIS — S4980XA Other specified injuries of shoulder and upper arm, unspecified arm, initial encounter: Secondary | ICD-10-CM | POA: Insufficient documentation

## 2014-02-12 DIAGNOSIS — S4991XA Unspecified injury of right shoulder and upper arm, initial encounter: Secondary | ICD-10-CM

## 2014-02-12 DIAGNOSIS — F17200 Nicotine dependence, unspecified, uncomplicated: Secondary | ICD-10-CM | POA: Insufficient documentation

## 2014-02-12 DIAGNOSIS — Z8669 Personal history of other diseases of the nervous system and sense organs: Secondary | ICD-10-CM | POA: Insufficient documentation

## 2014-02-12 MED ORDER — HYDROCODONE-ACETAMINOPHEN 5-325 MG PO TABS: 1.0000 | ORAL_TABLET | Freq: Four times a day (QID) | ORAL | Status: DC | PRN

## 2014-02-12 NOTE — ED Provider Notes (Signed)
CSN: [id number]     Arrival date & time 02/12/14  2016 History  This chart was scribed for non-physician practitioner, Roxy Horsemanobert Ollie Delano, PA-C, working with Toy BakerAnthony T Allen, MD, by Bronson CurbJacqueline Melvin, ED Scribe. This patient was seen in room WTR9/WTR9 and the patient's care was started at 9:40 PM.     Chief Complaint  Patient presents with  . Shoulder Pain     The history is provided by the patient. No language interpreter was used.    HPI Comments: Christopher Estes is a 28 y.o. male who presents to the Emergency Department with a chief complaint of constant right shoulder pain onset 1 week ago. Patient states he was injured his right shoulder while playing baseball when he "took a foul ball to the right collar bone". He states that, after receiving the injury, he [facility] where there was no fracture noted. Patient states that while playing baseball tonight, he felt "something pop" in his right shoulder. Patient reports the pain radiates from his right collar bone to his right arm, but states the pain is worse in the right shoulder. There is associated decreased ROM. He denies any other injuries.   Past Medical History  Diagnosis Date  . Migraine    Past Surgical History  Procedure Laterality Date  . Tonsillectomy    . Inner ear surgery     Family History  Problem Relation Age of Onset  . Diabetes Mother   . Hypertension Mother   . Asthma Mother   . Cancer Father   . Stroke Father   . Thyroid disease Father    History  Substance Use Topics  . Smoking status: Current Every Day Smoker    Types: Cigarettes  . Smokeless tobacco: Not on file  . Alcohol Use: 14.4 oz/week    24 Cans of beer per week     Comment: weekly    Review of Systems  Constitutional: Negative for fever and chills.  Respiratory: Negative for shortness of breath.   Cardiovascular: Negative for chest pain.  Gastrointestinal: Negative for nausea, vomiting, diarrhea and constipation.   Genitourinary: Negative for dysuria.  Musculoskeletal: Positive for arthralgias (right shoulder).      Allergies  Review of patient's allergies indicates no known allergies.  Home Medications   Prior to Admission medications   Medication Sig Start Date End Date Taking? Authorizing Provider  acetaminophen (TYLENOL) 325 MG tablet Take 650 mg by mouth every 6 (six) hours as needed for moderate pain.    Historical Provider, MD  amoxicillin (AMOXIL) 500 MG capsule Take 1 capsule (500 mg total) by mouth 3 (three) times daily. 12/13/13   Sunnie NielsenBrian Opitz, MD  Pseudoeph-Doxylamine-DM-APAP (NYQUIL MULTI-SYMPTOM PO) Take 15 mLs by mouth once.    Historical Provider, MD   Triage Vitals: BP 142/84  Pulse 77  Temp(Src) 98.6 F (37 C) (Oral)  Resp 16  Ht 5\' 10"  (1.778 m)  Wt 174 lb (78.926 kg)  BMI 24.97 kg/m2  SpO2 99%  Physical Exam  Nursing note and vitals reviewed. Constitutional: He is oriented to person, place, and time. He appears well-developed and well-nourished. No distress.  HENT:  Head: Normocephalic and atraumatic.  Eyes: Conjunctivae and EOM are normal.  Neck: Neck supple. No tracheal deviation present.  Cardiovascular: Normal rate and intact distal pulses.   Intact distal pulses with brisk capillary refill.  Pulmonary/Chest: Effort normal. No respiratory distress.  Musculoskeletal:  ROM and strength of right shoulder is limited secondary to  pain. No bony abnormality or deformity. Unable to abduct beyond 30 degrees. Positive empty can test. Positive Hawkins-Kennedy test.  Neurological: He is alert and oriented to person, place, and time.  Sensation intact.  Skin: Skin is warm and dry.  Psychiatric: He has a normal mood and affect. His behavior is normal.    ED Course  Procedures (including critical care time)  DIAGNOSTIC STUDIES: Oxygen Saturation is 99% on room air, normal by my interpretation.    COORDINATION OF CARE: At 2146 Discussed treatment plan with patient  which includes Norco/Vicodin. Patient agrees.   Labs Review Labs Reviewed - No data to display  Imaging Review Dg Shoulder Right  02/12/2014   CLINICAL DATA:  Injured right shoulder playing baseball.  EXAM: RIGHT SHOULDER - 2+ VIEW  COMPARISON:  Right clavicle radiographs - 07/20/2007  FINDINGS: No fracture or dislocation. Joint spaces are preserved. No evidence of calcific tendinitis. Regional soft tissues appear normal. Limited visualization of the adjacent thorax is normal.  IMPRESSION: Normal radiographs of the right shoulder.   Electronically Signed   By: Simonne Come M.D.   On: 02/12/2014 20:51     EKG Interpretation None      MDM   Final diagnoses:  Shoulder injury, right, initial encounter    Patient with right shoulder injury possibly rotator cuff tear. Recommend orthopedic followup. Plain films are negative. Will give patient given pain medicine. He has a sling at home, which he will wear. Discharged to home with orthopedic followup.  I personally performed the services described in this documentation, which was scribed in my presence. The recorded information has been reviewed and is accurate.     Roxy Horseman, PA-C 02/12/14 2239

## 2014-02-12 NOTE — Discharge Instructions (Signed)
Acromioclavicular Injuries °The AC (acromioclavicular) joint is the joint in the shoulder where the collarbone (clavicle) meets the shoulder blade (scapula). The part of the shoulder blade connected to the collarbone is called the acromion. Common problems with and treatments for the AC joint are detailed below. °ARTHRITIS °Arthritis occurs when the joint has been injured and the smooth padding between the joints (cartilage) is lost. This is the wear and tear seen in most joints of the body if they have been overused. This causes the joint to produce pain and swelling which is worse with activity.  °AC JOINT SEPARATION °AC joint separation means that the ligaments connecting the acromion of the shoulder blade and collarbone have been damaged, and the two bones no longer line up. AC separations can be anywhere from mild to severe, and are "graded" depending upon which ligaments are torn and how badly they are torn. °· Grade I Injury: the least damage is done, and the AC joint still lines up. °· Grade II Injury: damage to the ligaments which reinforce the AC joint. In a Grade II injury, these ligaments are stretched but not entirely torn. When stressed, the AC joint becomes painful and unstable. °· Grade III Injury: AC and secondary ligaments are completely torn, and the collarbone is no longer attached to the shoulder blade. This results in deformity; a prominence of the end of the clavicle. °AC JOINT FRACTURE °AC joint fracture means that there has been a break in the bones of the AC joint, usually the end of the clavicle. °TREATMENT °TREATMENT OF AC ARTHRITIS °· There is currently no way to replace the cartilage damaged by arthritis. The best way to improve the condition is to decrease the activities which aggravate the problem. Application of ice to the joint helps decrease pain and soreness (inflammation). The use of non-steroidal anti-inflammatory medication is helpful. °· If less conservative measures do not  work, then cortisone shots (injections) may be used. These are anti-inflammatories; they decrease the soreness in the joint and swelling. °· If non-surgical measures fail, surgery may be recommended. The procedure is generally removal of a portion of the end of the clavicle. This is the part of the collarbone closest to your acromion which is stabilized with ligaments to the acromion of the shoulder blade. This surgery may be performed using a tube-like instrument with a light (arthroscope) for looking into a joint. It may also be performed as an open surgery through a small incision by the surgeon. Most patients will have good range of motion within 6 weeks and may return to all activity including sports by 8-12 weeks, barring complications. °TREATMENT OF AN AC SEPARATION °· The initial treatment is to decrease pain. This is best accomplished by immobilizing the arm in a sling and placing an ice pack to the shoulder for 20 to 30 minutes every 2 hours as needed. As the pain starts to subside, it is important to begin moving the fingers, wrist, elbow and eventually the shoulder in order to prevent a stiff or "frozen" shoulder. Instruction on when and how much to move the shoulder will be provided by your caregiver. The length of time needed to regain full motion and function depends on the amount or grade of the injury. Recovery from a Grade I AC separation usually takes 10 to 14 days, whereas a Grade III may take 6 to 8 weeks. °· Grade I and II separations usually do not require surgery. Even Grade III injuries usually allow return to full   activity with few restrictions. Treatment is also based on the activity demands of the injured shoulder. For example, a high level quarterback with an injured throwing arm will receive more aggressive treatment than someone with a desk job who rarely uses his/her arm for strenuous activities. In some cases, a painful lump may persist which could require a later surgery. Surgery  can be very successful, but the benefits must be weighed against the potential risks. °TREATMENT OF AN AC JOINT FRACTURE °Fracture treatment depends on the type of fracture. Sometimes a splint or sling may be all that is required. Other times surgery may be required for repair. This is more frequently the case when the ligaments supporting the clavicle are completely torn. Your caregiver will help you with these decisions and together you can decide what will be the best treatment. °HOME CARE INSTRUCTIONS  °· Apply ice to the injury for 15-20 minutes each hour while awake for 2 days. Put the ice in a plastic bag and place a towel between the bag of ice and skin. °· If a sling has been applied, wear it constantly for as long as directed by your caregiver, even at night. The sling or splint can be removed for bathing or showering or as directed. Be sure to keep the shoulder in the same place as when the sling is on. Do not lift the arm. °· If a figure-of-eight splint has been applied it should be tightened gently by another person every day. Tighten it enough to keep the shoulders held back. Allow enough room to place the index finger between the body and strap. Loosen the splint immediately if there is numbness or tingling in the hands. °· Take over-the-counter or prescription medicines for pain, discomfort or fever as directed by your caregiver. °· If you or your child has received a follow up appointment, it is very important to keep that appointment in order to avoid long term complications, chronic pain or disability. °SEEK MEDICAL CARE IF:  °· The pain is not relieved with medications. °· There is increased swelling or discoloration that continues to get worse rather than better. °· You or your child has been unable to follow up as instructed. °· There is progressive numbness and tingling in the arm, forearm or hand. °SEEK IMMEDIATE MEDICAL CARE IF:  °· The arm is numb, cold or pale. °· There is increasing pain  in the hand, forearm or fingers. °MAKE SURE YOU:  °· Understand these instructions. °· Will watch your condition. °· Will get help right away if you are not doing well or get worse. °Document Released: 04/26/2005 Document Revised: 10/09/2011 Document Reviewed: 10/19/2008 °[employer]® Patient Information ©2015 [employer]. This information is not intended to replace advice given to you by your health care provider. Make sure you discuss any questions you have with your health care provider. ° °

## 2014-02-12 NOTE — ED Notes (Signed)
Pt states that he injured his right shoulder last Thursday playing baseball and [facility]. Stated it was not broken. States that while playing baseball tonight began having a shooting pain down his arm.

## 2014-02-15 NOTE — ED Provider Notes (Signed)
Medical screening examination/treatment/procedure(s) were performed by non-physician practitioner and as supervising physician I was immediately available for consultation/collaboration.  Toy BakerAnthony T Renly Roots, MD 02/15/14 [phone]

## 2014-05-06 ENCOUNTER — Emergency Department
Admission: EM | Admit: 2014-05-06 | Discharge: 2014-05-06 | Disposition: A | Discharge status: Home / Self Care | Payer: Self-pay | Source: Home / Self Care | Attending: Emergency Medicine | Admitting: Emergency Medicine

## 2014-05-06 ENCOUNTER — Encounter: Payer: Self-pay | Admitting: Emergency Medicine

## 2014-05-06 DIAGNOSIS — R220 Localized swelling, mass and lump, head: Secondary | ICD-10-CM

## 2014-05-06 HISTORY — DX: Insomnia, unspecified: G47.00

## 2014-05-06 MED ORDER — CLINDAMYCIN HCL 150 MG PO CAPS: 150.0000 mg | ORAL_CAPSULE | Freq: Four times a day (QID) | ORAL | Status: DC

## 2014-05-06 MED ORDER — IBUPROFEN 800 MG PO TABS: 800.0000 mg | ORAL_TABLET | Freq: Three times a day (TID) | ORAL | Status: DC

## 2014-05-06 MED ORDER — CEFTRIAXONE SODIUM 1 G IJ SOLR
1.0000 g | Freq: Once | INTRAMUSCULAR | Status: AC
  Administered 2014-05-06: 1 g via INTRAMUSCULAR

## 2014-05-06 NOTE — ED Provider Notes (Signed)
CSN: [id number]     Arrival date & time 05/06/14  1712 History   First MD Initiated Contact with Patient 05/06/14 1738     No chief complaint on file.  (Consider location/radiation/quality/duration/timing/severity/associated sxs/prior Treatment) Patient is a 28 y.o. male presenting with facial injury. The history is provided by the patient. No language interpreter was used.  Facial Injury Injury mechanism: facial swelling. Location:  Face Pain details:    Quality:  Aching   Severity:  Moderate   Duration:  2 days   Timing:  Constant   Progression:  Worsening Chronicity:  New Foreign body present:  No foreign bodies Relieved by:  Nothing Worsened by:  Nothing tried Ineffective treatments:  None tried Associated symptoms: no ear pain, no epistaxis, no headaches, no loss of consciousness, no neck pain and no rhinorrhea   Risk factors: no alcohol use     Past Medical History  Diagnosis Date  . Migraine    Past Surgical History  Procedure Laterality Date  . Tonsillectomy    . Inner ear surgery     Family History  Problem Relation Age of Onset  . Diabetes Mother   . Hypertension Mother   . Asthma Mother   . Cancer Father   . Stroke Father   . Thyroid disease Father    History  Substance Use Topics  . Smoking status: Current Every Day Smoker    Types: Cigarettes  . Smokeless tobacco: Not on file  . Alcohol Use: 14.4 oz/week    24 Cans of beer per week     Comment: weekly    Review of Systems  HENT: Negative for ear pain, nosebleeds and rhinorrhea.   Musculoskeletal: Negative for neck pain.  Neurological: Negative for loss of consciousness and headaches.  All other systems reviewed and are negative.   Allergies  Review of patient's allergies indicates no known allergies.  Home Medications   Prior to Admission medications   Medication Sig Start Date End Date Taking? Authorizing Provider  acetaminophen (TYLENOL) 325 MG tablet Take 650 mg by mouth every 6 (six)  hours as needed for moderate pain.    Historical Provider, MD  amoxicillin (AMOXIL) 500 MG capsule Take 1 capsule (500 mg total) by mouth 3 (three) times daily. 12/13/13   Sunnie NielsenBrian Opitz, MD  HYDROcodone-acetaminophen (NORCO/VICODIN) 5-325 MG per tablet Take 1-2 tablets by mouth every 6 (six) hours as needed for moderate pain or severe pain. 02/12/14   Roxy Horsemanobert Browning, PA-C  Pseudoeph-Doxylamine-DM-APAP (NYQUIL MULTI-SYMPTOM PO) Take 15 mLs by mouth once.    Historical Provider, MD   There were no vitals taken for this visit. Physical Exam  Nursing note and vitals reviewed. Constitutional: He appears well-developed and well-nourished.  HENT:  Head: Normocephalic and atraumatic.  Swollen right check, multiple broken decayed teeth.  Tender right maxillary sinus  Eyes: Pupils are equal, round, and reactive to light.  Neck: Normal range of motion.  Cardiovascular: Normal rate.   Pulmonary/Chest: Effort normal.  Abdominal: Soft.  Musculoskeletal: Normal range of motion.  Skin: Skin is warm.    ED Course  Procedures (including critical care time) Labs Review Labs Reviewed - No data to display  Imaging Review No results found.   MDM   1. Facial swelling    Clindamycin Ibuprofen Schedule to see your dentist for evaluation.    Return in 2 days for recheck if not improved/resplved avs    Christopher AreasLeslie K Sofia, PA-C 05/06/14 1800

## 2014-05-06 NOTE — Discharge Instructions (Signed)
Sinusitis °Sinusitis is redness, soreness, and inflammation of the paranasal sinuses. Paranasal sinuses are air pockets within the bones of your face (beneath the eyes, the middle of the forehead, or above the eyes). In healthy paranasal sinuses, mucus is able to drain out, and air is able to circulate through them by way of your nose. However, when your paranasal sinuses are inflamed, mucus and air can become trapped. This can allow bacteria and other germs to grow and cause infection. °Sinusitis can develop quickly and last only a short time (acute) or continue over a long period (chronic). Sinusitis that lasts for more than 12 weeks is considered chronic.  °CAUSES  °Causes of sinusitis include: °· Allergies. °· Structural abnormalities, such as displacement of the cartilage that separates your nostrils (deviated septum), which can decrease the air flow through your nose and sinuses and affect sinus drainage. °· Functional abnormalities, such as when the small hairs (cilia) that line your sinuses and help remove mucus do not work properly or are not present. °SIGNS AND SYMPTOMS  °Symptoms of acute and chronic sinusitis are the same. The primary symptoms are pain and pressure around the affected sinuses. Other symptoms include: °· Upper toothache. °· Earache. °· Headache. °· Bad breath. °· Decreased sense of smell and taste. °· A cough, which worsens when you are lying flat. °· Fatigue. °· Fever. °· Thick drainage from your nose, which often is green and may contain pus (purulent). °· Swelling and warmth over the affected sinuses. °DIAGNOSIS  °Your health care provider will perform a physical exam. During the exam, your health care provider may: °· Look in your nose for signs of abnormal growths in your nostrils (nasal polyps). °· Tap over the affected sinus to check for signs of infection. °· View the inside of your sinuses (endoscopy) using an imaging device that has a light attached (endoscope). °If your health  care provider suspects that you have chronic sinusitis, one or more of the following tests may be recommended: °· Allergy tests. °· Nasal culture. A sample of mucus is taken from your nose, sent to a lab, and screened for bacteria. °· Nasal cytology. A sample of mucus is taken from your nose and examined by your health care provider to determine if your sinusitis is related to an allergy. °TREATMENT  °Most cases of acute sinusitis are related to a viral infection and will resolve on their own within 10 days. Sometimes medicines are prescribed to help relieve symptoms (pain medicine, decongestants, nasal steroid sprays, or saline sprays).  °However, for sinusitis related to a bacterial infection, your health care provider will prescribe antibiotic medicines. These are medicines that will help kill the bacteria causing the infection.  °Rarely, sinusitis is caused by a fungal infection. In theses cases, your health care provider will prescribe antifungal medicine. °For some cases of chronic sinusitis, surgery is needed. Generally, these are cases in which sinusitis recurs more than 3 times per year, despite other treatments. °HOME CARE INSTRUCTIONS  °· Drink plenty of water. Water helps thin the mucus so your sinuses can drain more easily. °· Use a humidifier. °· Inhale steam 3 to 4 times a day (for example, sit in the bathroom with the shower running). °· Apply a warm, moist washcloth to your face 3 to 4 times a day, or as directed by your health care provider. °· Use saline nasal sprays to help moisten and clean your sinuses. °· Take medicines only as directed by your health care provider. °·   If you were prescribed either an antibiotic or antifungal medicine, finish it all even if you start to feel better. SEEK IMMEDIATE MEDICAL CARE IF:  You have increasing pain or severe headaches.  You have nausea, vomiting, or drowsiness.  You have swelling around your face.  You have vision problems.  You have a stiff  neck.  You have difficulty breathing. MAKE SURE YOU:   Understand these instructions.  Will watch your condition.  Will get help right away if you are not doing well or get worse. Document Released: 07/17/2005 Document Revised: 12/01/2013 Document Reviewed: 08/01/2011 [facility] Patient Information 2015 [place], [place]. This information is not intended to replace advice given to you by your health care provider. Make sure you discuss any questions you have with your health care provider. Abscess An abscess is an infected area that contains a collection of pus and debris.It can occur in almost any part of the body. An abscess is also known as a furuncle or boil. CAUSES  An abscess occurs when tissue gets infected. This can occur from blockage of oil or sweat glands, infection of hair follicles, or a minor injury to the skin. As the body tries to fight the infection, pus collects in the area and creates pressure under the skin. This pressure causes pain. People with weakened immune systems have difficulty fighting infections and get certain abscesses more often.  SYMPTOMS Usually an abscess develops on the skin and becomes a painful mass that is red, warm, and tender. If the abscess forms under the skin, you may feel a moveable soft area under the skin. Some abscesses break open (rupture) on their own, but most will continue to get worse without care. The infection can spread deeper into the body and eventually into the bloodstream, causing you to feel ill.  DIAGNOSIS  Your caregiver will take your medical history and perform a physical exam. A sample of fluid may also be taken from the abscess to determine what is causing your infection. TREATMENT  Your caregiver may prescribe antibiotic medicines to fight the infection. However, taking antibiotics alone usually does not cure an abscess. Your caregiver may need to make a small cut (incision) in the abscess to drain the pus. In some cases, gauze is  packed into the abscess to reduce pain and to continue draining the area. HOME CARE INSTRUCTIONS   Only take over-the-counter or prescription medicines for pain, discomfort, or fever as directed by your caregiver.  If you were prescribed antibiotics, take them as directed. Finish them even if you start to feel better.  If gauze is used, follow your caregiver's directions for changing the gauze.  To avoid spreading the infection:  Keep your draining abscess covered with a bandage.  Wash your hands well.  Do not share personal care items, towels, or whirlpools with others.  Avoid skin contact with others.  Keep your skin and clothes clean around the abscess.  Keep all follow-up appointments as directed by your caregiver. SEEK MEDICAL CARE IF:   You have increased pain, swelling, redness, fluid drainage, or bleeding.  You have muscle aches, chills, or a general ill feeling.  You have a fever. MAKE SURE YOU:   Understand these instructions.  Will watch your condition.  Will get help right away if you are not doing well or get worse. Document Released: 04/26/2005 Document Revised: 01/16/2012 Document Reviewed: 09/29/2011 [facility] Patient Information 2015 [place], [place]. This information is not intended to replace advice given to you by your  health care provider. Make sure you discuss any questions you have with your health care provider. ° °

## 2014-05-06 NOTE — ED Notes (Signed)
Reports onset of right side upper facial edema about 36 hours ago; no known injury or insect bite; no dental pain. Had similar situation about 5 years ago and was thoroughly examined with MRI, etc. and no definitive diagnosis.

## 2014-05-07 NOTE — ED Provider Notes (Signed)
Medical history/examination/treatment/procedure(s) were performed by non-physician provider and as supervising physician I was immediately available for consultation/collaboration.  Lajean Manesavid Massey, MD 05/07/14 [phone]

## 2014-06-11 DIAGNOSIS — K589 Irritable bowel syndrome without diarrhea: Secondary | ICD-10-CM | POA: Insufficient documentation

## 2014-06-11 DIAGNOSIS — E785 Hyperlipidemia, unspecified: Secondary | ICD-10-CM | POA: Insufficient documentation

## 2014-06-23 ENCOUNTER — Encounter: Payer: Self-pay | Admitting: *Deleted

## 2014-06-23 ENCOUNTER — Emergency Department
Admission: EM | Admit: 2014-06-23 | Discharge: 2014-06-23 | Disposition: A | Discharge status: Home / Self Care | Payer: BC Managed Care – PPO | Source: Home / Self Care | Attending: Family Medicine | Admitting: Family Medicine

## 2014-06-23 DIAGNOSIS — Z8669 Personal history of other diseases of the nervous system and sense organs: Secondary | ICD-10-CM

## 2014-06-23 DIAGNOSIS — H66004 Acute suppurative otitis media without spontaneous rupture of ear drum, recurrent, right ear: Secondary | ICD-10-CM

## 2014-06-23 HISTORY — DX: Hyperlipidemia, unspecified: E78.5

## 2014-06-23 MED ORDER — FLUTICASONE PROPIONATE 50 MCG/ACT NA SUSP: NASAL | Status: DC

## 2014-06-23 MED ORDER — AMOXICILLIN-POT CLAVULANATE 875-125 MG PO TABS
1.0000 | ORAL_TABLET | Freq: Two times a day (BID) | ORAL | Status: DC
Start: 2014-06-23 — End: 2015-05-16

## 2014-06-23 NOTE — Discharge Instructions (Signed)
May take Sudafed for sinus congestion.   Increase fluid intake, rest. May use Afrin nasal spray (or generic oxymetazoline) twice daily for about 5 days.  Also recommend using saline nasal spray several times daily and saline nasal irrigation (AYR is a common brand).  Use Flonase spray after using Afrin spray and saline. May take Ibuprofen 200mg , 4 tabs every 8 hours with food for earache. If symptoms become significantly worse during the night or over the weekend, proceed to the local emergency room.    Otitis Media Otitis media is redness, soreness, and inflammation of the middle ear. Otitis media may be caused by allergies or, most commonly, by infection. Often it occurs as a complication of the common cold. SIGNS AND SYMPTOMS Symptoms of otitis media may include:  Earache.  Fever.  Ringing in your ear.  Headache.  Leakage of fluid from the ear. DIAGNOSIS To diagnose otitis media, your health care provider will examine your ear with an otoscope. This is an instrument that allows your health care provider to see into your ear in order to examine your eardrum. Your health care provider also will ask you questions about your symptoms. TREATMENT  Typically, otitis media resolves on its own within 3-5 days. Your health care provider may prescribe medicine to ease your symptoms of pain. If otitis media does not resolve within 5 days or is recurrent, your health care provider may prescribe antibiotic medicines if he or she suspects that a bacterial infection is the cause. HOME CARE INSTRUCTIONS   If you were prescribed an antibiotic medicine, finish it all even if you start to feel better.  Take medicines only as directed by your health care provider.  Keep all follow-up visits as directed by your health care provider. SEEK MEDICAL CARE IF:  You have otitis media only in one ear, or bleeding from your nose, or both.  You notice a lump on your neck.  You are not getting better in 3-5  days.  You feel worse instead of better. SEEK IMMEDIATE MEDICAL CARE IF:   You have pain that is not controlled with medicine.  You have swelling, redness, or pain around your ear or stiffness in your neck.  You notice that part of your face is paralyzed.  You notice that the bone behind your ear (mastoid) is tender when you touch it. MAKE SURE YOU:   Understand these instructions.  Will watch your condition.  Will get help right away if you are not doing well or get worse. Document Released: 04/21/2004 Document Revised: 12/01/2013 Document Reviewed: 02/11/2013 [facility]ExitCare Patient Information 2015 [place], [place]. This information is not intended to replace advice given to you by your health care provider. Make sure you discuss any questions you have with your health care provider.

## 2014-06-23 NOTE — ED Provider Notes (Signed)
CSN: [id number]     Arrival date & time 06/23/14  1827 History   First MD Initiated Contact with Patient 06/23/14 1842     Chief Complaint  Patient presents with  . Otalgia      HPI Comments: Patient awoke with right earache at 2am today.  No nasal congestion, fever, sore throat, etc. He states that he has a long history of recurring otitis media, having had ear tubes in the past.  He has had three other episodes of otitis media this year, last episode four months ago.  The history is provided by the patient.    Past Medical History  Diagnosis Date  . Migraine   . Insomnia   . Hyperlipidemia    Past Surgical History  Procedure Laterality Date  . Tonsillectomy    . Inner ear surgery     Family History  Problem Relation Age of Onset  . Diabetes Mother   . Hypertension Mother   . Asthma Mother   . Cancer Father   . Stroke Father   . Thyroid disease Father    History  Substance Use Topics  . Smoking status: Current Every Day Smoker    Types: Cigarettes  . Smokeless tobacco: Not on file  . Alcohol Use: 14.4 oz/week    24 Cans of beer per week     Comment: weekly    Review of Systems No sore throat No cough No pleuritic pain No wheezing ? nasal congestion No post-nasal drainage No sinus pain/pressure No itchy/red eyes + right earache No hemoptysis No SOB No fever/chills No nausea No vomiting No abdominal pain No diarrhea No urinary symptoms No skin rash No fatigue No myalgias + headache Used OTC meds without relief  Allergies  Review of patient's allergies indicates no known allergies.  Home Medications   Prior to Admission medications   Medication Sig Start Date End Date Taking? Authorizing Provider  dicyclomine (BENTYL) 10 MG capsule Take 10 mg by mouth 4 (four) times daily -  before meals and at bedtime.   Yes Historical Provider, MD  acetaminophen (TYLENOL) 325 MG tablet Take 650 mg by mouth every 6 (six) hours as needed for moderate pain.     Historical Provider, MD  amoxicillin-clavulanate (AUGMENTIN) 875-125 MG per tablet Take 1 tablet by mouth 2 (two) times daily. Take with food 06/23/14   Lattie HawStephen A Lillee Mooneyhan, MD  fluticasone [facility](FLONASE) 50 MCG/ACT nasal spray Place two sprays in each nostril once daily 06/23/14   Lattie HawStephen A Adea Geisel, MD  ibuprofen (ADVIL,MOTRIN) 800 MG tablet Take 1 tablet (800 mg total) by mouth 3 (three) times daily. 05/06/14   Elson AreasLeslie K Sofia, PA-C   BP 152/88 mmHg  Pulse 77  Temp(Src) 97.6 F (36.4 C) (Oral)  Resp 18  Ht 5\' 11"  (1.803 m)  Wt 197 lb (89.359 kg)  BMI 27.49 kg/m2  SpO2 99% Physical Exam Nursing notes and Vital Signs reviewed. Appearance:  Patient appears healthy, stated age, and in no acute distress Eyes:  Pupils are equal, round, and reactive to light and accomodation.  Extraocular movement is intact.  Conjunctivae are not inflamed  Ears:  Right canal partly occluded with cerumen.  Right tympanic membrane is erythematous.  Left canal is partly occluded with cerumen.  Left tympanic membrane not fully visualized but appears normal. Nose:  Mildly congested turbinates.  No sinus tenderness.   Pharynx:  Normal Neck:  Supple.  No adenopathy Skin:  No rash present.    ED Course  Procedures  none   MDM   1. Recurrent acute suppurative otitis media of right ear without spontaneous rupture of tympanic membrane   2. History of otitis media    Begin Augmentin for 10 days.  Trial of Flonase  May take Sudafed for sinus congestion.   Increase fluid intake, rest. May use Afrin nasal spray (or generic oxymetazoline) twice daily for about 5 days.  Also recommend using saline nasal spray several times daily and saline nasal irrigation (AYR is a common brand).  Use Flonase spray after using Afrin spray and saline. May take Ibuprofen 200mg , 4 tabs every 8 hours with food for earache. If symptoms become significantly worse during the night or over the weekend, proceed to the local emergency room.  Followup with  ENT in about 10 days.    Lattie HawStephen A Kelechi Orgeron, MD 06/23/14 [phone]

## 2014-06-23 NOTE — ED Notes (Signed)
Pt c/o RT ear pain x 0200 today. Denies fever.

## 2014-08-05 DIAGNOSIS — F17200 Nicotine dependence, unspecified, uncomplicated: Secondary | ICD-10-CM | POA: Insufficient documentation

## 2014-11-30 ENCOUNTER — Encounter (HOSPITAL_COMMUNITY): Payer: Self-pay | Admitting: Emergency Medicine

## 2014-11-30 ENCOUNTER — Emergency Department (HOSPITAL_COMMUNITY)
Admission: EM | Admit: 2014-11-30 | Discharge: 2014-11-30 | Discharge status: Against Medical Advice | Payer: Self-pay | Attending: Emergency Medicine | Admitting: Emergency Medicine

## 2014-11-30 ENCOUNTER — Emergency Department (HOSPITAL_COMMUNITY): Payer: Self-pay

## 2014-11-30 DIAGNOSIS — Z8669 Personal history of other diseases of the nervous system and sense organs: Secondary | ICD-10-CM | POA: Insufficient documentation

## 2014-11-30 DIAGNOSIS — Z791 Long term (current) use of non-steroidal anti-inflammatories (NSAID): Secondary | ICD-10-CM | POA: Insufficient documentation

## 2014-11-30 DIAGNOSIS — R1013 Epigastric pain: Secondary | ICD-10-CM | POA: Insufficient documentation

## 2014-11-30 DIAGNOSIS — R112 Nausea with vomiting, unspecified: Secondary | ICD-10-CM | POA: Insufficient documentation

## 2014-11-30 DIAGNOSIS — Z8639 Personal history of other endocrine, nutritional and metabolic disease: Secondary | ICD-10-CM | POA: Insufficient documentation

## 2014-11-30 DIAGNOSIS — Z8679 Personal history of other diseases of the circulatory system: Secondary | ICD-10-CM | POA: Insufficient documentation

## 2014-11-30 DIAGNOSIS — R109 Unspecified abdominal pain: Secondary | ICD-10-CM

## 2014-11-30 LAB — COMPREHENSIVE METABOLIC PANEL
ALT: 22 U/L (ref 17–63)
AST: 23 U/L (ref 15–41)
Albumin: 4.1 g/dL (ref 3.5–5.0)
Alkaline Phosphatase: 52 U/L (ref 38–126)
Anion gap: 9 (ref 5–15)
BUN: 12 mg/dL (ref 6–20)
CHLORIDE: 106 mmol/L (ref 101–111)
CO2: 26 mmol/L (ref 22–32)
CREATININE: 0.76 mg/dL (ref 0.61–1.24)
Calcium: 9.4 mg/dL (ref 8.9–10.3)
GFR calc Af Amer: >60 mL/min (ref >60)
GLUCOSE: 111 mg/dL — AB (ref 70–99)
POTASSIUM: 3.8 mmol/L (ref 3.5–5.1)
Sodium: 141 mmol/L (ref 135–145)
TOTAL PROTEIN: 7.4 g/dL (ref 6.5–8.1)
Total Bilirubin: 1 mg/dL (ref 0.3–1.2)

## 2014-11-30 LAB — CBC WITH DIFFERENTIAL/PLATELET
BASOS PCT: 0 % (ref 0–1)
Basophils Absolute: 0 10*3/uL (ref 0.0–0.1)
EOS ABS: 0.1 10*3/uL (ref 0.0–0.7)
Eosinophils Relative: 0 % (ref 0–5)
HCT: 43.3 % (ref 39.0–52.0)
HEMOGLOBIN: 15.1 g/dL (ref 13.0–17.0)
LYMPHS ABS: 1.6 10*3/uL (ref 0.7–4.0)
LYMPHS PCT: 12 % (ref 12–46)
MCH: 31.7 pg (ref 26.0–34.0)
MCHC: 34.9 g/dL (ref 30.0–36.0)
MCV: 91 fL (ref 78.0–100.0)
MONO ABS: 0.6 10*3/uL (ref 0.1–1.0)
Monocytes Relative: 5 % (ref 3–12)
NEUTROS PCT: 83 % — AB (ref 43–77)
Neutro Abs: 11.4 10*3/uL — ABNORMAL HIGH (ref 1.7–7.7)
PLATELETS: 337 10*3/uL (ref 150–400)
RBC: 4.76 MIL/uL (ref 4.22–5.81)
RDW: 12.8 % (ref 11.5–15.5)
WBC: 13.7 10*3/uL — AB (ref 4.0–10.5)

## 2014-11-30 LAB — I-STAT TROPONIN, ED: TROPONIN I, POC: 0 ng/mL (ref 0.00–0.08)

## 2014-11-30 LAB — LIPASE, BLOOD: LIPASE: 18 U/L — AB (ref 22–51)

## 2014-11-30 MED ORDER — GI COCKTAIL ~~LOC~~
30.0000 mL | Freq: Once | ORAL | Status: AC
  Administered 2014-11-30: 30 mL via ORAL
  Filled 2014-11-30: qty 30

## 2014-11-30 MED ORDER — IOHEXOL 300 MG/ML  SOLN
25.0000 mL | INTRAMUSCULAR | Status: AC
Start: 2014-11-30 — End: 2014-11-30
  Administered 2014-11-30: 25 mL via ORAL

## 2014-11-30 NOTE — ED Notes (Signed)
Pt eloped prior to providing d/c instructions or vital signs.

## 2014-11-30 NOTE — Discharge Instructions (Signed)
Abdominal Pain °Many things can cause abdominal pain. Usually, abdominal pain is not caused by a disease and will improve without treatment. It can often be observed and treated at home. Your health care provider will do a physical exam and possibly order blood tests and X-rays to help determine the seriousness of your pain. However, in many cases, more time must pass before a clear cause of the pain can be found. Before that point, your health care provider may not know if you need more testing or further treatment. °HOME CARE INSTRUCTIONS  °Monitor your abdominal pain for any changes. The following actions may help to alleviate any discomfort you are experiencing: °· Only take over-the-counter or prescription medicines as directed by your health care provider. °· Do not take laxatives unless directed to do so by your health care provider. °· Try a clear liquid diet (broth, tea, or water) as directed by your health care provider. Slowly move to a bland diet as tolerated. °SEEK MEDICAL CARE IF: °· You have unexplained abdominal pain. °· You have abdominal pain associated with nausea or diarrhea. °· You have pain when you urinate or have a bowel movement. °· You experience abdominal pain that wakes you in the night. °· You have abdominal pain that is worsened or improved by eating food. °· You have abdominal pain that is worsened with eating fatty foods. °· You have a fever. °SEEK IMMEDIATE MEDICAL CARE IF:  °· Your pain does not go away within 2 hours. °· You keep throwing up (vomiting). °· Your pain is felt only in portions of the abdomen, such as the right side or the left lower portion of the abdomen. °· You pass bloody or black tarry stools. °MAKE SURE YOU: °· Understand these instructions.   °· Will watch your condition.   °· Will get help right away if you are not doing well or get worse.   °Document Released: 04/26/2005 Document Revised: 07/22/2013 Document Reviewed: 03/26/2013 °ExitCare® Patient Information  ©2015 [employer], [employer]. This information is not intended to replace advice given to you by your health care provider. Make sure you discuss any questions you have with your health care provider. ° °Chest Pain (Nonspecific) °It is often hard to give a specific diagnosis for the cause of chest pain. There is always a chance that your pain could be related to something serious, such as a heart attack or a blood clot in the lungs. You need to follow up with your health care provider for further evaluation. °CAUSES  °· Heartburn. °· Pneumonia or bronchitis. °· Anxiety or stress. °· Inflammation around your heart (pericarditis) or lung (pleuritis or pleurisy). °· A blood clot in the lung. °· A collapsed lung (pneumothorax). It can develop suddenly on its own (spontaneous pneumothorax) or from trauma to the chest. °· Shingles infection (herpes zoster virus). °The chest wall is composed of bones, muscles, and cartilage. Any of these can be the source of the pain. °· The bones can be bruised by injury. °· The muscles or cartilage can be strained by coughing or overwork. °· The cartilage can be affected by inflammation and become sore (costochondritis). °DIAGNOSIS  °Lab tests or other studies may be needed to find the cause of your pain. Your health care provider may have you take a test called an ambulatory electrocardiogram (ECG). An ECG records your heartbeat patterns over a 24-hour period. You may also have other tests, such as: °· Transthoracic echocardiogram (TTE). During echocardiography, sound waves are used to evaluate how blood   flows through your heart. °· Transesophageal echocardiogram (TEE). °· Cardiac monitoring. This allows your health care provider to monitor your heart rate and rhythm in real time. °· Holter monitor. This is a portable device that records your heartbeat and can help diagnose heart arrhythmias. It allows your health care provider to track your heart activity for several days, if needed. °· Stress  tests by exercise or by giving medicine that makes the heart beat faster. °TREATMENT  °· Treatment depends on what may be causing your chest pain. Treatment may include: °¨ Acid blockers for heartburn. °¨ Anti-inflammatory medicine. °¨ Pain medicine for inflammatory conditions. °¨ Antibiotics if an infection is present. °· You may be advised to change lifestyle habits. This includes stopping smoking and avoiding alcohol, caffeine, and chocolate. °· You may be advised to keep your head raised (elevated) when sleeping. This reduces the chance of acid going backward from your stomach into your esophagus. °Most of the time, nonspecific chest pain will improve within 2-3 days with rest and mild pain medicine.  °HOME CARE INSTRUCTIONS  °· If antibiotics were prescribed, take them as directed. Finish them even if you start to feel better. °· For the next few days, avoid physical activities that bring on chest pain. Continue physical activities as directed. °· Do not use any tobacco products, including cigarettes, chewing tobacco, or electronic cigarettes. °· Avoid drinking alcohol. °· Only take medicine as directed by your health care provider. °· Follow your health care provider's suggestions for further testing if your chest pain does not go away. °· Keep any follow-up appointments you made. If you do not go to an appointment, you could develop lasting (chronic) problems with pain. If there is any problem keeping an appointment, call to reschedule. °SEEK MEDICAL CARE IF:  °· Your chest pain does not go away, even after treatment. °· You have a rash with blisters on your chest. °· You have a fever. °SEEK IMMEDIATE MEDICAL CARE IF:  °· You have increased chest pain or pain that spreads to your arm, neck, jaw, back, or abdomen. °· You have shortness of breath. °· You have an increasing cough, or you cough up blood. °· You have severe back or abdominal pain. °· You feel nauseous or vomit. °· You have severe weakness. °· You  faint. °· You have chills. °This is an emergency. Do not wait to see if the pain will go away. Get medical help at once. Call your local emergency services (911 in [place] Do not drive yourself to the hospital. °MAKE SURE YOU:  °· Understand these instructions. °· Will watch your condition. °· Will get help right away if you are not doing well or get worse. °Document Released: 04/26/2005 Document Revised: 07/22/2013 Document Reviewed: 02/20/2008 °ExitCare® Patient Information ©2015 [employer]. This information is not intended to replace advice given to you by your health care provider. Make sure you discuss any questions you have with your health care provider. ° °

## 2014-11-30 NOTE — ED Notes (Signed)
Pt here with c/o chest pain along with n/v , pt received  324mg  asa  2 nitro and 4 of zofran , pt is a smoker

## 2014-11-30 NOTE — ED Provider Notes (Signed)
CSN: [id number]     Arrival date & time 11/30/14  [id number] History   First MD Initiated Contact with Patient 11/30/14 [phone]     Chief Complaint  Patient presents with  . Pleurisy     (Consider location/radiation/quality/duration/timing/severity/associated sxs/prior Treatment) Patient is a 29 y.o. male presenting with chest pain.  Chest Pain Pain location:  Substernal area (epigastric) Pain quality: sharp   Pain radiates to:  L shoulder and L arm Pain severity:  Moderate Onset quality:  Unable to specify Duration:  6 hours Timing:  Constant Progression:  Partially resolved Chronicity:  New Context: breathing   Relieved by:  Nothing Worsened by:  Deep breathing and movement Associated symptoms: abdominal pain, nausea and vomiting   Associated symptoms: no back pain, no cough, no fever, no headache and no lower extremity edema     Past Medical History  Diagnosis Date  . Migraine   . Insomnia   . Hyperlipidemia    Past Surgical History  Procedure Laterality Date  . Tonsillectomy    . Inner ear surgery     Family History  Problem Relation Age of Onset  . Diabetes Mother   . Hypertension Mother   . Asthma Mother   . Cancer Father   . Stroke Father   . Thyroid disease Father    History  Substance Use Topics  . Smoking status: Current Every Day Smoker    Types: Cigarettes  . Smokeless tobacco: Not on file  . Alcohol Use: 14.4 oz/week    24 Cans of beer per week     Comment: weekly    Review of Systems  Constitutional: Negative for fever.  Respiratory: Negative for cough.   Cardiovascular: Positive for chest pain.  Gastrointestinal: Positive for nausea, vomiting and abdominal pain.  Musculoskeletal: Negative for back pain.  Neurological: Negative for headaches.  All other systems reviewed and are negative.     Allergies  Review of patient's allergies indicates no known allergies.  Home Medications   Prior to Admission medications   Medication Sig Start  Date End Date Taking? Authorizing Provider  acetaminophen (TYLENOL) 325 MG tablet Take 650 mg by mouth every 6 (six) hours as needed for moderate pain.   Yes Historical Provider, MD  ibuprofen (ADVIL,MOTRIN) 800 MG tablet Take 1 tablet (800 mg total) by mouth 3 (three) times daily. 05/06/14  Yes Lonia SkinnerLeslie K Sofia, PA-C  promethazine (PHENERGAN) 25 MG tablet Take 25 mg by mouth every 6 (six) hours as needed for nausea or vomiting.   Yes Historical Provider, MD  amoxicillin-clavulanate (AUGMENTIN) 875-125 MG per tablet Take 1 tablet by mouth 2 (two) times daily. Take with food Patient not taking: Reported on 11/30/2014 06/23/14   Lattie HawStephen A Beese, MD  fluticasone [facility](FLONASE) 50 MCG/ACT nasal spray Place two sprays in each nostril once daily Patient not taking: Reported on 11/30/2014 06/23/14   Lattie HawStephen A Beese, MD   BP 112/63 mmHg  Pulse 61  Temp(Src) 98.1 F (36.7 C) (Oral)  Resp 13  Ht 5\' 11"  (1.803 m)  Wt 200 lb (90.719 kg)  BMI 27.91 kg/m2  SpO2 96% Physical Exam  Constitutional: He is oriented to person, place, and time. He appears well-developed and well-nourished.  HENT:  Head: Normocephalic and atraumatic.  Eyes: Conjunctivae and EOM are normal.  Neck: Normal range of motion. Neck supple.  Cardiovascular: Normal rate, regular rhythm and normal heart sounds.   Pulmonary/Chest: Effort normal and breath sounds normal. No respiratory distress.  Abdominal: He exhibits  no distension. There is tenderness in the epigastric area. There is no rebound and no guarding.  Musculoskeletal: Normal range of motion.  Neurological: He is alert and oriented to person, place, and time.  Skin: Skin is warm and dry.  Vitals reviewed.   ED Course  Procedures (including critical care time) Labs Review Labs Reviewed  CBC WITH DIFFERENTIAL/PLATELET - Abnormal; Notable for the following:    WBC 13.7 (*)    Neutrophils Relative % 83 (*)    Neutro Abs 11.4 (*)    All other components within normal limits   COMPREHENSIVE METABOLIC PANEL - Abnormal; Notable for the following:    Glucose, Bld 111 (*)    All other components within normal limits  LIPASE, BLOOD - Abnormal; Notable for the following:    Lipase 18 (*)    All other components within normal limits  I-STAT TROPOININ, ED    Imaging Review Dg Chest 2 View  11/30/2014   CLINICAL DATA:  29 year old male with central chest pain and nausea since 0300 hrs. Initial encounter. Current history of smoking.  EXAM: CHEST  2 VIEW  COMPARISON:  Chest and rib radiographs 03/13/2013.  FINDINGS: Stable lung volumes. Normal cardiac size and mediastinal contours. Visualized tracheal air column is within normal limits. Borderline to mild increased interstitial markings are stable. No pneumothorax, pulmonary edema, pleural effusion or confluent pulmonary opacity. Stable mild scoliosis. No acute osseous abnormality identified.  IMPRESSION: No acute cardiopulmonary abnormality.   Electronically Signed   By: Odessa Fleming M.D.   On: 11/30/2014 09:48     EKG Interpretation None        Date: 11/30/2014  Rate: 70  Rhythm: normal sinus rhythm  QRS Axis: normal  Intervals: normal  ST/T Wave abnormalities: normal  Conduction Disutrbances: none  Narrative Interpretation: unremarkable     MDM   Final diagnoses:  Abdominal pain    30 y.o. male with pertinent PMH of family ho early MI presents with chest/epigastric pain as above.  On arrival vitals and physical exam as above.  EKG unremarkable.  CXR clear.    Wu as above unremarkable, however CT scan ordered of abd to ro pancreatitis or other emergent intraabdominal pathology given abd tenderness and nature of symptoms.  Pt refused scan.  Feel this is necessary.  Discussed risks of leaving, pt decided to leave against medical advice.  Feel he is competent to make medical decisions, and I attempted to allay any concerns, however the pt stated "I dont think I have pancreatitis" and did not have any further  questions.  DC  AMA.  I have reviewed all laboratory and imaging studies if ordered as above  1. Abdominal pain         Mirian Mo, MD 11/30/14 1248

## 2014-12-24 ENCOUNTER — Encounter (HOSPITAL_COMMUNITY): Payer: Self-pay

## 2014-12-24 ENCOUNTER — Emergency Department (HOSPITAL_COMMUNITY): Payer: Self-pay

## 2014-12-24 ENCOUNTER — Emergency Department (HOSPITAL_COMMUNITY)
Admission: EM | Admit: 2014-12-24 | Discharge: 2014-12-24 | Disposition: A | Discharge status: Home / Self Care | Payer: Self-pay | Attending: Emergency Medicine | Admitting: Emergency Medicine

## 2014-12-24 DIAGNOSIS — Z792 Long term (current) use of antibiotics: Secondary | ICD-10-CM | POA: Insufficient documentation

## 2014-12-24 DIAGNOSIS — R1010 Upper abdominal pain, unspecified: Secondary | ICD-10-CM

## 2014-12-24 DIAGNOSIS — R0602 Shortness of breath: Secondary | ICD-10-CM | POA: Insufficient documentation

## 2014-12-24 DIAGNOSIS — Z791 Long term (current) use of non-steroidal anti-inflammatories (NSAID): Secondary | ICD-10-CM | POA: Insufficient documentation

## 2014-12-24 DIAGNOSIS — Z8669 Personal history of other diseases of the nervous system and sense organs: Secondary | ICD-10-CM | POA: Insufficient documentation

## 2014-12-24 DIAGNOSIS — Z8639 Personal history of other endocrine, nutritional and metabolic disease: Secondary | ICD-10-CM | POA: Insufficient documentation

## 2014-12-24 DIAGNOSIS — G43909 Migraine, unspecified, not intractable, without status migrainosus: Secondary | ICD-10-CM | POA: Insufficient documentation

## 2014-12-24 DIAGNOSIS — K2970 Gastritis, unspecified, without bleeding: Secondary | ICD-10-CM | POA: Insufficient documentation

## 2014-12-24 DIAGNOSIS — Z720 Tobacco use: Secondary | ICD-10-CM | POA: Insufficient documentation

## 2014-12-24 DIAGNOSIS — R42 Dizziness and giddiness: Secondary | ICD-10-CM | POA: Insufficient documentation

## 2014-12-24 DIAGNOSIS — Z7951 Long term (current) use of inhaled steroids: Secondary | ICD-10-CM | POA: Insufficient documentation

## 2014-12-24 LAB — URINALYSIS, ROUTINE W REFLEX MICROSCOPIC
Bilirubin Urine: NEGATIVE
GLUCOSE, UA: NEGATIVE mg/dL
Hgb urine dipstick: NEGATIVE
Ketones, ur: 40 mg/dL — AB
Leukocytes, UA: NEGATIVE
Nitrite: NEGATIVE
PH: 8.5 — AB (ref 5.0–8.0)
Protein, ur: 30 mg/dL — AB
Specific Gravity, Urine: 1.022 (ref 1.005–1.030)
Urobilinogen, UA: 0.2 mg/dL (ref 0.0–1.0)

## 2014-12-24 LAB — CBC WITH DIFFERENTIAL/PLATELET
Basophils Absolute: 0 K/uL (ref 0.0–0.1)
Basophils Relative: 0 % (ref 0–1)
Eosinophils Absolute: 0.3 K/uL (ref 0.0–0.7)
Eosinophils Relative: 2 % (ref 0–5)
HCT: 48.6 % (ref 39.0–52.0)
Hemoglobin: 16.9 g/dL (ref 13.0–17.0)
Lymphocytes Relative: 15 % (ref 12–46)
Lymphs Abs: 2 K/uL (ref 0.7–4.0)
MCH: 31.4 pg (ref 26.0–34.0)
MCHC: 34.8 g/dL (ref 30.0–36.0)
MCV: 90.3 fL (ref 78.0–100.0)
Monocytes Absolute: 0.7 K/uL (ref 0.1–1.0)
Monocytes Relative: 5 % (ref 3–12)
Neutro Abs: 10 K/uL — ABNORMAL HIGH (ref 1.7–7.7)
Neutrophils Relative %: 78 % — ABNORMAL HIGH (ref 43–77)
Platelets: 338 K/uL (ref 150–400)
RBC: 5.38 MIL/uL (ref 4.22–5.81)
RDW: 12.6 % (ref 11.5–15.5)
WBC: 12.9 K/uL — ABNORMAL HIGH (ref 4.0–10.5)

## 2014-12-24 LAB — COMPREHENSIVE METABOLIC PANEL WITH GFR
ALT: 22 U/L (ref 17–63)
AST: 24 U/L (ref 15–41)
Albumin: 5.3 g/dL — ABNORMAL HIGH (ref 3.5–5.0)
Alkaline Phosphatase: 56 U/L (ref 38–126)
Anion gap: 16 — ABNORMAL HIGH (ref 5–15)
BUN: 12 mg/dL (ref 6–20)
CO2: 20 mmol/L — ABNORMAL LOW (ref 22–32)
Calcium: 10.2 mg/dL (ref 8.9–10.3)
Chloride: 104 mmol/L (ref 101–111)
Creatinine, Ser: 0.87 mg/dL (ref 0.61–1.24)
GFR calc Af Amer: >60 mL/min
GFR calc non Af Amer: >60 mL/min
Glucose, Bld: 108 mg/dL — ABNORMAL HIGH (ref 65–99)
Potassium: 3.9 mmol/L (ref 3.5–5.1)
Sodium: 140 mmol/L (ref 135–145)
Total Bilirubin: 1.2 mg/dL (ref 0.3–1.2)
Total Protein: 8.6 g/dL — ABNORMAL HIGH (ref 6.5–8.1)

## 2014-12-24 LAB — BRAIN NATRIURETIC PEPTIDE: B Natriuretic Peptide: 6.8 pg/mL (ref 0.0–100.0)

## 2014-12-24 LAB — URINE MICROSCOPIC-ADD ON

## 2014-12-24 LAB — LIPASE, BLOOD: Lipase: 33 U/L (ref 22–51)

## 2014-12-24 LAB — I-STAT TROPONIN, ED: Troponin i, poc: 0 ng/mL (ref 0.00–0.08)

## 2014-12-24 MED ORDER — FAMOTIDINE IN NACL 20-0.9 MG/50ML-% IV SOLN
20.0000 mg | Freq: Once | INTRAVENOUS | Status: AC
  Administered 2014-12-24: 20 mg via INTRAVENOUS
  Filled 2014-12-24: qty 50

## 2014-12-24 MED ORDER — ONDANSETRON HCL 4 MG/2ML IJ SOLN
4.0000 mg | Freq: Once | INTRAMUSCULAR | Status: AC
  Administered 2014-12-24: 4 mg via INTRAVENOUS
  Filled 2014-12-24: qty 2

## 2014-12-24 MED ORDER — METOCLOPRAMIDE HCL 10 MG PO TABS: 10.0000 mg | ORAL_TABLET | Freq: Four times a day (QID) | ORAL | Status: DC

## 2014-12-24 MED ORDER — PROMETHAZINE HCL 25 MG/ML IJ SOLN
12.5000 mg | Freq: Once | INTRAMUSCULAR | Status: AC
  Administered 2014-12-24: 12.5 mg via INTRAVENOUS
  Filled 2014-12-24: qty 1

## 2014-12-24 MED ORDER — IOHEXOL 300 MG/ML  SOLN
100.0000 mL | Freq: Once | INTRAMUSCULAR | Status: AC | PRN
  Administered 2014-12-24: 100 mL via INTRAVENOUS

## 2014-12-24 MED ORDER — METOCLOPRAMIDE HCL 5 MG/ML IJ SOLN
10.0000 mg | Freq: Once | INTRAMUSCULAR | Status: AC
  Administered 2014-12-24: 10 mg via INTRAVENOUS
  Filled 2014-12-24: qty 2

## 2014-12-24 MED ORDER — IOHEXOL 300 MG/ML  SOLN: 50.0000 mL | Freq: Once | INTRAMUSCULAR | Status: DC | PRN

## 2014-12-24 MED ORDER — RANITIDINE HCL 150 MG PO TABS: 150.0000 mg | ORAL_TABLET | Freq: Two times a day (BID) | ORAL | Status: DC

## 2014-12-24 MED ORDER — GI COCKTAIL ~~LOC~~
30.0000 mL | Freq: Once | ORAL | Status: DC
  Filled 2014-12-24: qty 30

## 2014-12-24 NOTE — ED Notes (Signed)
Nurse starting IV 

## 2014-12-24 NOTE — ED Notes (Signed)
Patient transported to CT 

## 2014-12-24 NOTE — ED Notes (Signed)
Pt presents with c/o chest pain and N/V that started around 3 am this morning. Pt reports that he is also feeling some shortness of breath and dizziness. Pt was seen recently for the same. Pt reports he has been unable to keep anything down.

## 2014-12-24 NOTE — ED Notes (Addendum)
NP at bedside.

## 2014-12-24 NOTE — ED Notes (Signed)
Pt independently ambulated to restroom with stand by assist.

## 2014-12-24 NOTE — ED Notes (Signed)
Pt called out that he needed to use the restroom. Pt attempted to ambulate to the Restroom, but stated he was too weak to walk. Provided Pt with urinal in case he needed to void.

## 2014-12-24 NOTE — ED Provider Notes (Signed)
CSN: [id number]     Arrival date & time 12/24/14  1104 History   First MD Initiated Contact with Patient 12/24/14 1148     Chief Complaint  Patient presents with  . Chest Pain  . Emesis     (Consider location/radiation/quality/duration/timing/severity/associated sxs/prior Treatment) Patient is a 29 y.o. male presenting with chest pain and vomiting. The history is provided by the patient.  Chest Pain Pain location:  Epigastric Pain quality: burning and sharp   Radiates to: abdomen. Pain radiates to the back: no   Pain severity:  Severe Onset quality:  Gradual Duration:  8 hours Timing:  Constant Progression:  Worsening Chronicity:  Recurrent Relieved by:  Nothing Worsened by:  Movement, deep breathing and coughing Associated symptoms: abdominal pain, dizziness, nausea, shortness of breath (when the pain is bad) and vomiting   Emesis Associated symptoms: abdominal pain    Christopher Estes is a 29 y.o. male who presents to the ED with abdominal pain, n/v that started about 3 am. He reports having had similar symptoms in the past and was in the ED and signed out AMA prior to having evaluation completed.  Past Medical History  Diagnosis Date  . Migraine   . Insomnia   . Hyperlipidemia    Past Surgical History  Procedure Laterality Date  . Tonsillectomy    . Inner ear surgery     Family History  Problem Relation Age of Onset  . Diabetes Mother   . Hypertension Mother   . Asthma Mother   . Cancer Father   . Stroke Father   . Thyroid disease Father    History  Substance Use Topics  . Smoking status: Current Every Day Smoker    Types: Cigarettes  . Smokeless tobacco: Not on file  . Alcohol Use: 14.4 oz/week    24 Cans of beer per week     Comment: weekly    Review of Systems  Respiratory: Positive for shortness of breath (when the pain is bad).   Cardiovascular: Positive for chest pain (epigastric area).  Gastrointestinal: Positive for nausea, vomiting and abdominal  pain.  Neurological: Positive for dizziness.  all other systems negative    Allergies  Review of patient's allergies indicates no known allergies.  Home Medications   Prior to Admission medications   Medication Sig Start Date End Date Taking? Authorizing Provider  acetaminophen (TYLENOL) 325 MG tablet Take 650 mg by mouth every 6 (six) hours as needed for moderate pain.   Yes Historical Provider, MD  amoxicillin-clavulanate (AUGMENTIN) 875-125 MG per tablet Take 1 tablet by mouth 2 (two) times daily. Take with food Patient not taking: Reported on 11/30/2014 06/23/14   Lattie HawStephen A Beese, MD  fluticasone [facility](FLONASE) 50 MCG/ACT nasal spray Place two sprays in each nostril once daily Patient not taking: Reported on 11/30/2014 06/23/14   Lattie HawStephen A Beese, MD  ibuprofen (ADVIL,MOTRIN) 800 MG tablet Take 1 tablet (800 mg total) by mouth 3 (three) times daily. Patient not taking: Reported on 12/24/2014 05/06/14   Elson AreasLeslie K Sofia, PA-C  metoCLOPramide (REGLAN) 10 MG tablet Take 1 tablet (10 mg total) by mouth every 6 (six) hours. 12/24/14   Hope Orlene OchM Neese, NP  ranitidine (ZANTAC) 150 MG tablet Take 1 tablet (150 mg total) by mouth 2 (two) times daily. 12/24/14   Hope Orlene OchM Neese, NP   BP 156/101 mmHg  Pulse 66  Temp(Src) 98.2 F (36.8 C) (Oral)  Resp 16  SpO2 100% Physical Exam  Constitutional: He is oriented  to person, place, and time. He appears well-developed and well-nourished.  HENT:  Head: Normocephalic and atraumatic.  Eyes: Conjunctivae and EOM are normal.  Neck: Normal range of motion. Neck supple.  Cardiovascular: Normal rate and regular rhythm.   Pulmonary/Chest: Effort normal. He has no wheezes. He has no rales.  Abdominal: Soft. There is generalized tenderness. There is no rebound, no guarding and no CVA tenderness.  Musculoskeletal: Normal range of motion.  Neurological: He is alert and oriented to person, place, and time. No cranial nerve deficit.  Skin: Skin is warm and dry.  Psychiatric:  He has a normal mood and affect. His behavior is normal.  Nursing note and vitals reviewed.   ED Course  Procedures (including critical care time) Labs, IV fluids, Zofran, Phenergan GI cocktail, Reglan, Pepcid  Labs Review Results for orders placed or performed during the hospital encounter of 12/24/14 (from the past 24 hour(s))  CBC with Differential     Status: Abnormal   Collection Time: 12/24/14 11:38 AM  Result Value Ref Range   WBC 12.9 (H) 4.0 - 10.5 K/uL   RBC 5.38 4.22 - 5.81 MIL/uL   Hemoglobin 16.9 13.0 - 17.0 g/dL   HCT 16.1 09.6 - 04.5 %   MCV 90.3 78.0 - 100.0 fL   MCH 31.4 26.0 - 34.0 pg   MCHC 34.8 30.0 - 36.0 g/dL   RDW 40.9 81.1 - 91.4 %   Platelets 338 150 - 400 K/uL   Neutrophils Relative % 78 (H) 43 - 77 %   Neutro Abs 10.0 (H) 1.7 - 7.7 K/uL   Lymphocytes Relative 15 12 - 46 %   Lymphs Abs 2.0 0.7 - 4.0 K/uL   Monocytes Relative 5 3 - 12 %   Monocytes Absolute 0.7 0.1 - 1.0 K/uL   Eosinophils Relative 2 0 - 5 %   Eosinophils Absolute 0.3 0.0 - 0.7 K/uL   Basophils Relative 0 0 - 1 %   Basophils Absolute 0.0 0.0 - 0.1 K/uL  Comprehensive metabolic panel     Status: Abnormal   Collection Time: 12/24/14 11:38 AM  Result Value Ref Range   Sodium 140 135 - 145 mmol/L   Potassium 3.9 3.5 - 5.1 mmol/L   Chloride 104 101 - 111 mmol/L   CO2 20 (L) 22 - 32 mmol/L   Glucose, Bld 108 (H) 65 - 99 mg/dL   BUN 12 6 - 20 mg/dL   Creatinine, Ser 7.82 0.61 - 1.24 mg/dL   Calcium 95.6 8.9 - 21.3 mg/dL   Total Protein 8.6 (H) 6.5 - 8.1 g/dL   Albumin 5.3 (H) 3.5 - 5.0 g/dL   AST 24 15 - 41 U/L   ALT 22 17 - 63 U/L   Alkaline Phosphatase 56 38 - 126 U/L   Total Bilirubin 1.2 0.3 - 1.2 mg/dL   GFR calc non Af Amer >60 >60 mL/min   GFR calc Af Amer >60 >60 mL/min   Anion gap 16 (H) 5 - 15  Lipase, blood     Status: None   Collection Time: 12/24/14 11:38 AM  Result Value Ref Range   Lipase 33 22 - 51 U/L  Brain natriuretic peptide     Status: None   Collection  Time: 12/24/14 11:38 AM  Result Value Ref Range   B Natriuretic Peptide 6.8 0.0 - 100.0 pg/mL  I-stat troponin, ED (only if pt is 28 y.o. or older & pain is above umbilicus)  not at [facility], [facility]  Status: None   Collection Time: 12/24/14 11:44 AM  Result Value Ref Range   Troponin i, poc 0.00 0.00 - 0.08 ng/mL   Comment 3          Urinalysis, Routine w reflex microscopic     Status: Abnormal   Collection Time: 12/24/14 12:13 PM  Result Value Ref Range   Color, Urine YELLOW YELLOW   APPearance CLOUDY (A) CLEAR   Specific Gravity, Urine 1.022 1.005 - 1.030   pH 8.5 (H) 5.0 - 8.0   Glucose, UA NEGATIVE NEGATIVE mg/dL   Hgb urine dipstick NEGATIVE NEGATIVE   Bilirubin Urine NEGATIVE NEGATIVE   Ketones, ur 40 (A) NEGATIVE mg/dL   Protein, ur 30 (A) NEGATIVE mg/dL   Urobilinogen, UA 0.2 0.0 - 1.0 mg/dL   Nitrite NEGATIVE NEGATIVE   Leukocytes, UA NEGATIVE NEGATIVE  Urine microscopic-add on     Status: Abnormal   Collection Time: 12/24/14 12:13 PM  Result Value Ref Range   WBC, UA 0-2 <3 WBC/hpf   RBC / HPF 0-2 <3 RBC/hpf   Bacteria, UA FEW (A) RARE   Urine-Other MUCOUS PRESENT      Imaging Review Ct Abdomen Pelvis W Contrast  12/24/2014   CLINICAL DATA:  Mid abdominal pain starting this morning, chest pain  EXAM: CT ABDOMEN AND PELVIS WITH CONTRAST  TECHNIQUE: Multidetector CT imaging of the abdomen and pelvis was performed using the standard protocol following bolus administration of intravenous contrast.  CONTRAST:  OMNIPAQUE IOHEXOL 300 MG/ML  SOLN  COMPARISON:  None.  FINDINGS: Lung bases are unremarkable. Sagittal images of the spine are unremarkable.  Enhanced liver is unremarkable. No calcified gallstones are noted within gallbladder. Enhanced pancreas, spleen and adrenal glands are unremarkable. Tiny accessory splenules. Enhanced kidneys are symmetrical in size. No hydronephrosis or hydroureter.  No small bowel obstruction. No ascites or free air. No adenopathy. Some stool  noted in right colon and transverse colon. No pericecal inflammation. Normal retrocecal appendix noted in axial image 65. The left colon and sigmoid colon are empty collapsed. No distal colonic obstruction. Prostate gland and seminal vesicles are unremarkable. The urinary bladder is unremarkable. No pelvic ascites or adenopathy. No inguinal adenopathy. No destructive bony lesions are noted within pelvis.  IMPRESSION: 1. There is no acute inflammatory process within abdomen or pelvis. 2. Normal appendix. No pericecal inflammation. No hydronephrosis or hydroureter. 3. No small bowel obstruction.   Electronically Signed   By: Natasha Mead M.D.   On: 12/24/2014 14:21   Dr. Loretha Stapler in to examine the patient. Will d/c home to follow up with GI.  Patient reports that after the Reglan his nausea has subsided and he is feeling better.  MDM  29 y.o. male with epigastric pain, n/v that has improved with medications and IV fluids. Stable for discharge without pain at this time. Normal CT and labs. Discussed with the patient clinical, lab and CT findings and plan of care and all questioned fully answered. He will follow up with GI or return if any problems arise.   Final diagnoses:  Pain of upper abdomen  Gastritis       Janne Napoleon, NP 12/24/14 1710  Blake Divine, MD 12/29/14 1114

## 2014-12-24 NOTE — Discharge Instructions (Signed)
Call for follow up with [facility] for further evaluation of your abdominal pain and nausea

## 2014-12-24 NOTE — ED Notes (Signed)
Pt was ambulating in hallway with one staff assist when pt appeared to become off balance and stumble. Several staff members to pt's side to assist him into a chair. Pt reported that he does not want to sit into a chair. Made pt aware that he cannot stand in the hallway and we need him to sit in the chair so that he does not fall. Pt turned away from staff, refusing any further help, and ambulated back to his room mumbling the whole way back. Milus GlazierJennifer Hamilton in to speak with pt and pt requests made known to PA.

## 2014-12-24 NOTE — ED Notes (Signed)
While updating VS, pt became very anxious. Pt stated he was tired of feeling like this, and he was going to go home if nothing was going to be done.

## 2014-12-24 NOTE — Progress Notes (Signed)
EDCM spoke to patient at bedside. Patient confirms she [place] a [place]cp [place]  [facility], informed patient of services there and walk in times.  EDCM also provided patient with list of pcps who accept self pay patients, list of discount pharmacies and websites needymeds.org and GoodRX.com for medication assistance, phone number to inquire about the orange card, phone number to inquire about Mediciad, phone number to inquire about the Affordable Care Act, financial resources in the community such as local churches, [employer], urban ministries, and dental assistance for uninsured patients.  Patient thankful for resources.  No further EDCM needs at this time. Patient vomiting at bedside.  EDRN made aware.

## 2014-12-24 NOTE — ED Notes (Signed)
Pt began dry heaving after taking a sip of ginger ale. Pt adamant he wants to leave. Gave discharge papers, pt stated he would return if symptoms persist.

## 2014-12-24 NOTE — ED Notes (Signed)
Pt tried to walk to restroom. Started staggering when standing outside room. Offered wheelchair. Pt refused wheelchair. States he wants to leave.

## 2014-12-24 NOTE — ED Notes (Signed)
RN was bedside when pt stopped talking to her and began to shake mildy.  His pulse went from 87 to 124 and he became unresponsive to RN.  He was responsive to sternal rub and became responsive. Heart rate returned to 88.  The episode lasted less than 60 seconds.  Seizures precautions implemented and Provider notified.  Will continue to monitor.

## 2014-12-24 NOTE — ED Notes (Signed)
Pt is actively vomiting

## 2014-12-29 NOTE — ED Provider Notes (Signed)
Medical screening examination/treatment/procedure(s) were conducted as a shared visit with non-physician practitioner(s) and myself.  I personally evaluated the patient during the encounter.   EKG Interpretation   Date/Time:  Thursday Dec 24 2014 11:11:11 EDT Ventricular Rate:  86 PR Interval:  126 QRS Duration: 95 QT Interval:  373 QTC Calculation: 446 R Axis:   84 Text Interpretation:  Sinus rhythm Baseline wander in lead(s) I aVL V3 V4  V5 No old tracing to compare Confirmed by [facility]  MD, TREY ([id number]) on  12/24/2014 3:02:18 PM      29 yo male with epigastric abdominal pain and nausea.  On exam, well appearing, nontoxic, not distressed, normal respiratory effort, normal perfusion, abdomen soft but tender in epigastrium, not in RUQ or lower abdomen.  Labs unremarkble and CT negative.  He also reported drinking alcohol last night, which could be related to symptoms today.  Plan to treat for gastritis.    Clinical Impression: 1. Pain of upper abdomen   2. Gastritis       Blake DivineJohn Adalie Mand, MD 12/29/14 1114

## 2015-05-16 ENCOUNTER — Emergency Department (HOSPITAL_COMMUNITY)
Admission: EM | Admit: 2015-05-16 | Discharge: 2015-05-16 | Disposition: A | Discharge status: Home / Self Care | Payer: Self-pay | Attending: Emergency Medicine | Admitting: Emergency Medicine

## 2015-05-16 ENCOUNTER — Encounter (HOSPITAL_COMMUNITY): Payer: Self-pay | Admitting: Emergency Medicine

## 2015-05-16 DIAGNOSIS — G43909 Migraine, unspecified, not intractable, without status migrainosus: Secondary | ICD-10-CM | POA: Insufficient documentation

## 2015-05-16 DIAGNOSIS — K920 Hematemesis: Secondary | ICD-10-CM | POA: Insufficient documentation

## 2015-05-16 DIAGNOSIS — Z8639 Personal history of other endocrine, nutritional and metabolic disease: Secondary | ICD-10-CM | POA: Insufficient documentation

## 2015-05-16 DIAGNOSIS — Z720 Tobacco use: Secondary | ICD-10-CM | POA: Insufficient documentation

## 2015-05-16 DIAGNOSIS — H66004 Acute suppurative otitis media without spontaneous rupture of ear drum, recurrent, right ear: Secondary | ICD-10-CM | POA: Insufficient documentation

## 2015-05-16 DIAGNOSIS — Z792 Long term (current) use of antibiotics: Secondary | ICD-10-CM | POA: Insufficient documentation

## 2015-05-16 DIAGNOSIS — Z79899 Other long term (current) drug therapy: Secondary | ICD-10-CM | POA: Insufficient documentation

## 2015-05-16 DIAGNOSIS — Z9889 Other specified postprocedural states: Secondary | ICD-10-CM | POA: Insufficient documentation

## 2015-05-16 MED ORDER — AMOXICILLIN-POT CLAVULANATE 875-125 MG PO TABS: 1.0000 | ORAL_TABLET | Freq: Two times a day (BID) | ORAL | Status: DC

## 2015-05-16 MED ORDER — IBUPROFEN 800 MG PO TABS
800.0000 mg | ORAL_TABLET | Freq: Three times a day (TID) | ORAL | Status: DC
Start: 2015-05-16 — End: 2016-04-18

## 2015-05-16 NOTE — Discharge Instructions (Signed)

## 2015-05-16 NOTE — ED Notes (Signed)
Awake. Verbally responsive. A/O x4. Resp even and unlabored. No audible adventitious breath sounds noted. ABC's intact.  

## 2015-05-16 NOTE — ED Provider Notes (Signed)
CSN: [id number]     Arrival date & time 05/16/15  0614 History   First MD Initiated Contact with Patient 05/16/15 [phone]     Chief Complaint  Patient presents with  . Otalgia  . Hematemesis     (Consider location/radiation/quality/duration/timing/severity/associated sxs/prior Treatment) Patient is a 29 y.o. male presenting with ear pain. The history is provided by the patient. No language interpreter was used.  Otalgia Location:  Right Behind ear:  Redness Quality:  Aching Severity:  Moderate Onset quality:  Gradual Duration:  1 day Timing:  Constant Progression:  Worsening Chronicity:  New Relieved by:  Nothing Worsened by:  Nothing tried Ineffective treatments:  None tried Associated symptoms: no abdominal pain, no congestion, no sore throat and no vomiting     Past Medical History  Diagnosis Date  . Migraine   . Insomnia   . Hyperlipidemia    Past Surgical History  Procedure Laterality Date  . Tonsillectomy    . Inner ear surgery     Family History  Problem Relation Age of Onset  . Diabetes Mother   . Hypertension Mother   . Asthma Mother   . Cancer Father   . Stroke Father   . Thyroid disease Father    Social History  Substance Use Topics  . Smoking status: Current Every Day Smoker    Types: Cigarettes  . Smokeless tobacco: None  . Alcohol Use: 14.4 oz/week    24 Cans of beer per week     Comment: weekly    Review of Systems  HENT: Positive for ear pain. Negative for congestion and sore throat.   Gastrointestinal: Negative for vomiting and abdominal pain.  All other systems reviewed and are negative.     Allergies  Review of patient's allergies indicates no known allergies.  Home Medications   Prior to Admission medications   Medication Sig Start Date End Date Taking? Authorizing Provider  acetaminophen (TYLENOL) 325 MG tablet Take 650 mg by mouth every 6 (six) hours as needed for moderate pain.    Historical Provider, MD   amoxicillin-clavulanate (AUGMENTIN) 875-125 MG per tablet Take 1 tablet by mouth 2 (two) times daily. Take with food Patient not taking: Reported on 11/30/2014 06/23/14   Lattie HawStephen A Beese, MD  fluticasone [facility](FLONASE) 50 MCG/ACT nasal spray Place two sprays in each nostril once daily Patient not taking: Reported on 11/30/2014 06/23/14   Lattie HawStephen A Beese, MD  ibuprofen (ADVIL,MOTRIN) 800 MG tablet Take 1 tablet (800 mg total) by mouth 3 (three) times daily. Patient not taking: Reported on 12/24/2014 05/06/14   Elson AreasLeslie K Wrangler Penning, PA-C  metoCLOPramide (REGLAN) 10 MG tablet Take 1 tablet (10 mg total) by mouth every 6 (six) hours. 12/24/14   Hope Orlene OchM Neese, NP  ranitidine (ZANTAC) 150 MG tablet Take 1 tablet (150 mg total) by mouth 2 (two) times daily. 12/24/14   Hope Orlene OchM Neese, NP   BP 154/93 mmHg  Pulse 71  Temp(Src) 98.2 F (36.8 C) (Oral)  Resp 18  SpO2 99% Physical Exam  Constitutional: He is oriented to person, place, and time. He appears well-developed and well-nourished.  HENT:  Head: Normocephalic.  Right Ear: External ear normal.  Left Ear: External ear normal.  Nose: Nose normal.  Mouth/Throat: Oropharynx is clear and moist.  Eyes: Conjunctivae and EOM are normal. Pupils are equal, round, and reactive to light.  Neck: Normal range of motion.  Cardiovascular: Normal rate and normal heart sounds.   Pulmonary/Chest: Effort normal.  Abdominal: Soft.  He exhibits no distension.  Musculoskeletal: Normal range of motion.  Neurological: He is alert and oriented to person, place, and time.  Skin: Skin is warm.  Psychiatric: He has a normal mood and affect.  Nursing note and vitals reviewed.   ED Course  Procedures (including critical care time) Labs Review Labs Reviewed - No data to display  Imaging Review No results found. I have personally reviewed and evaluated these images and lab results as part of my medical decision-making.   EKG Interpretation None      MDM   Final diagnoses:   None    Augmentin Ibuprofen Return if any problems. avs    Lonia Skinner [place]d, PA-C 05/16/15 9518  Azalia Bilis, MD 05/16/15 [phone]

## 2015-05-16 NOTE — ED Notes (Signed)
Pt arrived to the ED with a complaint of right sided otalgia. Pt states he has had nine previous ear infections in this ear.  Pt state that he also has hat hematemesis that started today.  Pt states he has had three episodes of emesis. Pt states that the blood looks as if it is in chunks.

## 2015-07-21 ENCOUNTER — Emergency Department (HOSPITAL_COMMUNITY)
Admission: EM | Admit: 2015-07-21 | Discharge: 2015-07-21 | Disposition: A | Discharge status: Home / Self Care | Payer: Self-pay | Attending: Emergency Medicine | Admitting: Emergency Medicine

## 2015-07-21 ENCOUNTER — Encounter (HOSPITAL_COMMUNITY): Payer: Self-pay

## 2015-07-21 DIAGNOSIS — G43909 Migraine, unspecified, not intractable, without status migrainosus: Secondary | ICD-10-CM | POA: Insufficient documentation

## 2015-07-21 DIAGNOSIS — Z79899 Other long term (current) drug therapy: Secondary | ICD-10-CM | POA: Insufficient documentation

## 2015-07-21 DIAGNOSIS — R1033 Periumbilical pain: Secondary | ICD-10-CM | POA: Insufficient documentation

## 2015-07-21 DIAGNOSIS — R112 Nausea with vomiting, unspecified: Secondary | ICD-10-CM | POA: Insufficient documentation

## 2015-07-21 DIAGNOSIS — Z791 Long term (current) use of non-steroidal anti-inflammatories (NSAID): Secondary | ICD-10-CM | POA: Insufficient documentation

## 2015-07-21 DIAGNOSIS — R1013 Epigastric pain: Secondary | ICD-10-CM | POA: Insufficient documentation

## 2015-07-21 DIAGNOSIS — F17210 Nicotine dependence, cigarettes, uncomplicated: Secondary | ICD-10-CM | POA: Insufficient documentation

## 2015-07-21 DIAGNOSIS — Z8739 Personal history of other diseases of the musculoskeletal system and connective tissue: Secondary | ICD-10-CM | POA: Insufficient documentation

## 2015-07-21 DIAGNOSIS — Z8669 Personal history of other diseases of the nervous system and sense organs: Secondary | ICD-10-CM | POA: Insufficient documentation

## 2015-07-21 LAB — POC OCCULT BLOOD, ED: Fecal Occult Bld: NEGATIVE

## 2015-07-21 LAB — COMPREHENSIVE METABOLIC PANEL
ALK PHOS: 49 U/L (ref 38–126)
ALT: 21 U/L (ref 17–63)
ANION GAP: 14 (ref 5–15)
AST: 25 U/L (ref 15–41)
Albumin: 4.5 g/dL (ref 3.5–5.0)
BUN: 13 mg/dL (ref 6–20)
CO2: 24 mmol/L (ref 22–32)
Calcium: 9.8 mg/dL (ref 8.9–10.3)
Chloride: 100 mmol/L — ABNORMAL LOW (ref 101–111)
Creatinine, Ser: 0.93 mg/dL (ref 0.61–1.24)
GFR calc Af Amer: >60 mL/min (ref >60)
Glucose, Bld: 109 mg/dL — ABNORMAL HIGH (ref 65–99)
Potassium: 3.4 mmol/L — ABNORMAL LOW (ref 3.5–5.1)
SODIUM: 138 mmol/L (ref 135–145)
TOTAL PROTEIN: 7.4 g/dL (ref 6.5–8.1)
Total Bilirubin: 1.7 mg/dL — ABNORMAL HIGH (ref 0.3–1.2)

## 2015-07-21 LAB — CBC WITH DIFFERENTIAL/PLATELET
Basophils Absolute: 0 10*3/uL (ref 0.0–0.1)
Basophils Relative: 0 %
EOS PCT: 0 %
Eosinophils Absolute: 0 10*3/uL (ref 0.0–0.7)
HCT: 48.2 % (ref 39.0–52.0)
Hemoglobin: 16.8 g/dL (ref 13.0–17.0)
LYMPHS ABS: 1.8 10*3/uL (ref 0.7–4.0)
LYMPHS PCT: 14 %
MCH: 31.1 pg (ref 26.0–34.0)
MCHC: 34.9 g/dL (ref 30.0–36.0)
MCV: 89.3 fL (ref 78.0–100.0)
MONOS PCT: 5 %
Monocytes Absolute: 0.7 10*3/uL (ref 0.1–1.0)
Neutro Abs: 10.6 10*3/uL — ABNORMAL HIGH (ref 1.7–7.7)
Neutrophils Relative %: 81 %
PLATELETS: 304 10*3/uL (ref 150–400)
RBC: 5.4 MIL/uL (ref 4.22–5.81)
RDW: 12.7 % (ref 11.5–15.5)
WBC: 13.2 10*3/uL — ABNORMAL HIGH (ref 4.0–10.5)

## 2015-07-21 MED ORDER — DICYCLOMINE HCL 20 MG PO TABS: 20.0000 mg | ORAL_TABLET | Freq: Two times a day (BID) | ORAL | Status: DC

## 2015-07-21 MED ORDER — ONDANSETRON HCL 4 MG/2ML IJ SOLN
4.0000 mg | Freq: Once | INTRAMUSCULAR | Status: AC
  Administered 2015-07-21: 4 mg via INTRAVENOUS
  Filled 2015-07-21: qty 2

## 2015-07-21 MED ORDER — ONDANSETRON 4 MG PO TBDP: 4.0000 mg | ORAL_TABLET | Freq: Three times a day (TID) | ORAL | Status: DC | PRN

## 2015-07-21 MED ORDER — SODIUM CHLORIDE 0.9 % IV BOLUS (SEPSIS)
1000.0000 mL | Freq: Once | INTRAVENOUS | Status: AC
  Administered 2015-07-21: 1000 mL via INTRAVENOUS

## 2015-07-21 NOTE — ED Provider Notes (Signed)
CSN: [id number]     Arrival date & time 07/21/15  0830 History   First MD Initiated Contact with Patient 07/21/15 [phone]     Chief Complaint  Patient presents with  . Emesis     (Consider location/radiation/quality/duration/timing/severity/associated sxs/prior Treatment) HPI   Christopher Estes is a 29 y.o. male, with a history of migraine, presenting to the ED with nausea, vomiting, abdominal pain for the last 4 days. Patient states that he may have eaten some questionable food Saturday night. Patient describes the abdominal pain as a soreness, located periumbilical and epigastric area, rates it 3 out of 10, nonradiating. Patient states that he has tried to keep himself hydrated by drinking water, Gatorade, and Pedialyte. Patient denies fever/chills, diarrhea/constipation, chest pain, shortness of breath, hematochezia or melena, or any other pain or complaints.    Past Medical History  Diagnosis Date  . Migraine   . Insomnia   . Hyperlipidemia    Past Surgical History  Procedure Laterality Date  . Tonsillectomy    . Inner ear surgery     Family History  Problem Relation Age of Onset  . Diabetes Mother   . Hypertension Mother   . Asthma Mother   . Cancer Father   . Stroke Father   . Thyroid disease Father    Social History  Substance Use Topics  . Smoking status: Current Every Day Smoker    Types: Cigarettes  . Smokeless tobacco: None  . Alcohol Use: 14.4 oz/week    24 Cans of beer per week     Comment: weekly    Review of Systems  Constitutional: Negative for fever, chills and diaphoresis.  Respiratory: Negative for shortness of breath.   Cardiovascular: Negative for chest pain.  All other systems reviewed and are negative.     Allergies  Review of patient's allergies indicates no known allergies.  Home Medications   Prior to Admission medications   Medication Sig Start Date End Date Taking? Authorizing Provider  acetaminophen (TYLENOL) 325 MG tablet Take 650  mg by mouth every 6 (six) hours as needed for moderate pain.   Yes Historical Provider, MD  metoCLOPramide (REGLAN) 10 MG tablet Take 1 tablet (10 mg total) by mouth every 6 (six) hours. 12/24/14  Yes Hope Orlene Och, NP  ranitidine (ZANTAC) 150 MG tablet Take 1 tablet (150 mg total) by mouth 2 (two) times daily. 12/24/14  Yes Hope Orlene Och, NP  amoxicillin-clavulanate (AUGMENTIN) 875-125 MG tablet Take 1 tablet by mouth 2 (two) times daily. Take with food Patient not taking: Reported on 07/21/2015 05/16/15   Elson Areas, PA-C  dicyclomine (BENTYL) 20 MG tablet Take 1 tablet (20 mg total) by mouth 2 (two) times daily. 07/21/15   Mekhai Venuto C Calahan Pak, PA-C  fluticasone (FLONASE) 50 MCG/ACT nasal spray Place two sprays in each nostril once daily Patient not taking: Reported on 11/30/2014 06/23/14   Lattie Haw, MD  ibuprofen (ADVIL,MOTRIN) 800 MG tablet Take 1 tablet (800 mg total) by mouth 3 (three) times daily. 05/16/15   Elson Areas, PA-C  ondansetron (ZOFRAN ODT) 4 MG disintegrating tablet Take 1 tablet (4 mg total) by mouth every 8 (eight) hours as needed for nausea or vomiting. 07/21/15   Jerene Yeager C Nahara Dona, PA-C   BP 125/77 mmHg  Pulse 63  Temp(Src) 97.6 F (36.4 C) (Oral)  Resp 20  Ht  (1.803 m)  Wt 81.647 kg  BMI 25.12 kg/m2  SpO2 98% Physical Exam  Constitutional: He appears  well-developed and well-nourished. No distress.  HENT:  Head: Normocephalic and atraumatic.  Eyes: Conjunctivae are normal. Pupils are equal, round, and reactive to light.  Cardiovascular: Normal rate, regular rhythm and normal heart sounds.   Pulmonary/Chest: Effort normal and breath sounds normal. No respiratory distress.  Abdominal: Soft. Normal appearance and bowel sounds are normal. There is tenderness in the epigastric area and periumbilical area. There is no CVA tenderness, no tenderness at McBurney's point and negative Murphy's sign.  Genitourinary:  Hemoccult negative. No gross blood. No stool in the rectal  vault.  Musculoskeletal: He exhibits no edema or tenderness.  Neurological: He is alert.  Skin: Skin is warm and dry. He is not diaphoretic.  Nursing note and vitals reviewed.   ED Course  Procedures (including critical care time) Labs Review Labs Reviewed  CBC WITH DIFFERENTIAL/PLATELET - Abnormal; Notable for the following:    WBC 13.2 (*)    Neutro Abs 10.6 (*)    All other components within normal limits  COMPREHENSIVE METABOLIC PANEL - Abnormal; Notable for the following:    Potassium 3.4 (*)    Chloride 100 (*)    Glucose, Bld 109 (*)    Total Bilirubin 1.7 (*)    All other components within normal limits  POC OCCULT BLOOD, ED    Imaging Review No results found. I have personally reviewed and evaluated these lab results as part of my medical decision-making.   EKG Interpretation None      MDM   Final diagnoses:  Non-intractable vomiting with nausea, vomiting of unspecified type  Epigastric pain    Christopher Estes presents with abdominal soreness, nausea, and vomiting for the last 4 days.  Patient's presentation is consistent with a viral illness and dehydration. Plan to rehydrate patient with IV fluids and treat the patient's nausea. Patient is afebrile, not tachycardic, is normotensive, nontoxic appearing, in no apparent distress, and is maintaining SPO2 of 98-100% on room air. 10:07 AM patient states that he feels much better and his nausea has subsided. Patient has no additional complaints and states that his abdominal discomfort has subsided. Patient adds that he would like to go home. Patient was given instructions for home care as well as return precautions. Patient voiced understanding of these instructions, accepts the plan, and is comfortable with discharge.  Filed Vitals:   07/21/15 0837 07/21/15 0843 07/21/15 0957  BP: 138/91  125/77  Pulse: 59  63  Temp: 97.6 F (36.4 C)    TempSrc: Oral    Resp: 20  20  Height:  5\' 11"  (1.803 m)   Weight:  81.647  kg   SpO2: 100%  98%     Anselm PancoastShawn C Jaidon Ellery, PA-C 07/21/15 1108  Alvira MondayErin Schlossman, MD 07/22/15 [phone]

## 2015-07-21 NOTE — Discharge Instructions (Signed)
You have been seen today for nausea, vomiting, and abdominal pain. Follow up with PCP as needed. Return to ED should symptoms worsen. Use Tylenol for pain or fever. Zofran for nausea and vomiting. Bentyl for abdominal discomfort.   Emergency Department Resource Guide 1) Find a Doctor and Pay Out of Pocket Although you won't have to find out who is covered by your insurance plan, it is a good idea to ask around and get recommendations. You will then need to call the office and see if the doctor you have chosen will accept you as a new patient and what types of options they offer for patients who are self-pay. Some doctors offer discounts or will set up payment plans for their patients who do not have insurance, but you will need to ask so you aren't surprised when you get to your appointment.  2) Contact Your Local Health Department Not all health departments have doctors that can see patients for sick visits, but many do, so it is worth a call to see if yours does. If you don't know where your local health department is, you can check in your phone book. The CDC also has a tool to help you locate your state's health department, and many state websites also have listings of all of their local health departments.  3) Find a Walk-in Clinic If your illness is not likely to be very severe or complicated, you may want to try a walk in clinic. These are popping up all over the country in pharmacies, drugstores, and shopping centers. They're usually staffed by nurse practitioners or physician assistants that have been trained to treat common illnesses and complaints. They're usually fairly quick and inexpensive. However, if you have serious medical issues or chronic medical problems, these are probably not your best option.  No Primary Care Doctor: - Call Health Connect at  [phone] - they can help you locate a primary care doctor that  accepts your insurance, provides certain services, etc. - Physician  Referral Service- [phone]  Chronic Pain Problems: Organization         Address  Phone   Notes  [facility]  [phone] Patients need to be referred by their primary care doctor.   Medication Assistance: Organization         Address  Phone   Notes  [facility] [address] [place], [place] [address] [phone] --Must be a resident of [facility] -- Must have NO insurance coverage whatsoever (no Medicaid/ Medicare, etc.) -- The pt. MUST have a primary care doctor that directs their care regularly and follows them in the community   MedAssist  [phone]   [employer]  [phone]) [phone]    Agencies that provide inexpensive medical care: Organization         Address  Phone   Notes  [facility]  [phone]   [facility]    [phone]   [facility] [address] [place], [place] [address] [phone]   [facility] [address], [place] [phone]   Planned Parenthood    [phone]   [facility]    [phone]   [facility]  [address], [place] Phone:  [phone], Fax:  [phone] Hours of Operation:  9 am - 6 pm, M-F.  Also accepts Medicaid/Medicare and self-pay.  [facility] for [place], [address], [place] Phone: [phone], Fax: [phone]. Hours of Operation:  8:30 am - 5:30 pm, M-F.  Also accepts Medicaid and self-pay.  [facility] [address] [place] Phone: [phone]   [place], [place], [place] [phone], Ext. [phone] Mondays & Thursdays: 7-9 AM.  First 15 patients are seen on a first come, first serve basis.    [place] Providers:  Organization         Address  Phone   Notes  [facility] [address], [place] [phone] Also accepts self-pay patients.  [facility] [address], [place]  [phone]   [place], [address], [place] [phone]   [facility] [address], [place] [phone]   Lucianne Lei [address], [place]   [phone] Only accepts [place] Access [place] patients after they have their name applied to their card.   Self-Pay (no insurance) in [facility]:  Organization         Address  Phone   Notes  Sickle Cell Patients, [facility] Internal Medicine [place] [phone]   [facility] [place] [phone]   [facility] [place]  [place], [place], [place] [phone]   [facility]/Dr. Osei-Bonsu  [address] [place] or [address], [place] [phone] Phone number for both [place] and [place] locations is the same.  Urgent Medical and [facility] [address] [place] [phone]   [facility] [address] [place] or [address] [place] [address] [phone]   [facility] [address] [place] [phone], phone; [phone], fax Sees patients 1st and 3rd Saturday of every month.  Must not qualify for public or private insurance (i.e. Medicaid, Medicare, [place] Health Choice, Veterans' Benefits)  Household income should be no more than 200% of the poverty level The clinic cannot treat you if you are pregnant or think you are pregnant  Sexually transmitted diseases are not treated at the clinic.    Dental Care: Organization         Address  Phone  Notes  [facility] of [facility] [place] [phone] Accepts children up to age 84 who are enrolled  in [place] or [place]; pregnant women with a Medicaid card; and children who have applied for Medicaid or [place] Health Choice, but were declined, whose parents can pay a reduced fee at time of service.  [facility] of [facility]  [address], [place] [phone] Accepts children up to age 33 who are enrolled in [place] or [place]; pregnant women with a Medicaid card; and children who have applied for Medicaid or [place] Health Choice, but were declined, whose parents can pay a reduced fee at time of service.  [facility] Access PROGRAM  [place] [phone] Patients are seen by appointment only. Walk-ins are not accepted. [place] will see patients 49 years of age and older. Monday - Tuesday (8am-5pm) Most Wednesdays (8:30-5pm) $30 per  visit, cash only  [facility] Adult [employer] PROGRAM  [address], [facility] [phone] Patients are seen by appointment only. Walk-ins are not accepted. [place] will see patients 75 years of age and older. One Wednesday Evening (Monthly: Volunteer Based).  $30 per visit, cash only  [place]  [phone] for adults; Children under age 64, call Graduate Pediatric Dentistry at [phone]. Children aged 33-14, please call [phone] to request a pediatric application.  Dental services are provided in all areas of dental care including fillings, crowns and bridges, complete and partial dentures, implants, gum treatment, root canals, and extractions. Preventive care is also provided. Treatment is provided to both adults and children. Patients are selected via a lottery and there is often a waiting list.   [facility] [address], [place]  [phone] [email or web address]   [facility] [address] [place], [place] [phone], Ext. [phone] Second and Fourth Thursday of each month, opens at  6:30 AM; Clinic ends at 9 AM.  Patients are seen on a first-come first-served basis, and a limited number are seen during each clinic.   [facility]  [address] Hillard Danker [place], [place] [phone]   Eligibility Requirements You must have lived in [place], [place], or [place] counties for at least the last three months.   You cannot be eligible for state or federal sponsored [employer], including [employer], [place], or [employer].   You generally cannot be eligible for healthcare insurance through your employer.    How to apply: Eligibility screenings are held every Tuesday and Wednesday afternoon from 1:00 pm until 4:00 pm. You do not need an appointment for the interview!  [facility] [address], [place], [place]   [place]  [place] Department  [place]  [phone]    Behavioral Health Resources in the Community: Intensive Outpatient Programs Organization         Address  Phone  Notes  [place]. [address] [place], [place] [phone]   [facility]patient [address], [place], [place]   ADS: Alcohol & Drug Svcs [address], [place], [place]   [place] [address]  [place], [place] or [phone]   Substance Abuse Resources Organization         Address  Phone  Notes  Alcohol and Drug Services  [phone]   [place]  [phone]   The [place]   Chinita Pester  [phone]   Residential & Outpatient Substance Abuse Program  [phone]   Psychological Services Organization         Address  Phone  Notes  [facility]  [place]  [phone]   [place] [address], [place] or [phone]    Mobile Crisis Teams Organization         Address  Phone  Notes  [facility], Mobile Crisis Care Unit  [phone]   [facility]ive Psychotherapeutic Services  [address] [place], [place]   [place] Levels [address] [place] [phone]    Self-Help/Support Groups Organization         Address  Phone  Notes  [facility]ental [facility] Assoc[facility] of [place] - variety of support groups  [place] Call for more information  Narcotics Anonymous (NA), [facility] [address], [employer] [place]  2 meetings at this location   Special educational needs teacher         Address  Phone  Notes  ASAP Residential Treatment [place],    [place]  [phone]   [facility]  [address], [place] [phone], [place], [place]   [place], [place] [phone] Admissions: 8am-3pm M-F  Incentives Substance [place] [address],    [place], [place] [phone]   [facility] [address] [place], [place], [place]   [facility] [address],  [place], [place]   Insight Programs - Intensive Outpatient [address] Kristeen Mans [address], [place], [place]   [facility] ([place] [address].,  [place], [place] [phone] or [phone]   [facility] Treatment Services (RTS) [address] [place], [place] Accepts Medicaid  [place] [address]  [place] [phone] Substance Abuse/Addiction Treatment   [facility] Organization         Address  Phone  Notes  [facility]  [phone]   Domenic Schwab, PhD [address] Arlis Porta [place], [place]   [phone] or [phone]   [place], [place] [phone]   [facility]  [address] [place], [place] [phone] Insurance/Medicaid/sponsorship through [facility] and Families [address], [address] [place]                                    [place], [place] [phone] [place] [address], [place] [phone]    Dr. Adele Schilder  [phone]   [facility] Dept. 1) [address], [place] 2) [place] 3)  [place] [address], [place] [phone]  [phone]   [place] [phone] or [phone] (After Hours)

## 2015-07-21 NOTE — ED Notes (Signed)
Pt presents with 4 day h/o abdominal pain, nausea and vomiting.  Pt reports inability to keep water down.  Pt reports vomiting both bright red and dark red blood.

## 2015-08-19 ENCOUNTER — Emergency Department (HOSPITAL_COMMUNITY)
Admission: EM | Admit: 2015-08-19 | Discharge: 2015-08-19 | Discharge status: Against Medical Advice | Payer: Self-pay | Attending: Emergency Medicine | Admitting: Emergency Medicine

## 2015-08-19 ENCOUNTER — Encounter (HOSPITAL_COMMUNITY): Payer: Self-pay | Admitting: Emergency Medicine

## 2015-08-19 DIAGNOSIS — R112 Nausea with vomiting, unspecified: Secondary | ICD-10-CM | POA: Insufficient documentation

## 2015-08-19 DIAGNOSIS — R1084 Generalized abdominal pain: Secondary | ICD-10-CM | POA: Insufficient documentation

## 2015-08-19 DIAGNOSIS — R6883 Chills (without fever): Secondary | ICD-10-CM | POA: Insufficient documentation

## 2015-08-19 DIAGNOSIS — F17210 Nicotine dependence, cigarettes, uncomplicated: Secondary | ICD-10-CM | POA: Insufficient documentation

## 2015-08-19 LAB — COMPREHENSIVE METABOLIC PANEL
ALBUMIN: 4.9 g/dL (ref 3.5–5.0)
ALK PHOS: 54 U/L (ref 38–126)
ALT: 22 U/L (ref 17–63)
AST: 22 U/L (ref 15–41)
Anion gap: 17 — ABNORMAL HIGH (ref 5–15)
BILIRUBIN TOTAL: 1.2 mg/dL (ref 0.3–1.2)
BUN: 15 mg/dL (ref 6–20)
CALCIUM: 10.3 mg/dL (ref 8.9–10.3)
CO2: 19 mmol/L — AB (ref 22–32)
CREATININE: 0.82 mg/dL (ref 0.61–1.24)
Chloride: 104 mmol/L (ref 101–111)
GFR calc Af Amer: >60 mL/min (ref >60)
GFR calc non Af Amer: >60 mL/min (ref >60)
GLUCOSE: 108 mg/dL — AB (ref 65–99)
Potassium: 3.9 mmol/L (ref 3.5–5.1)
SODIUM: 140 mmol/L (ref 135–145)
TOTAL PROTEIN: 8.3 g/dL — AB (ref 6.5–8.1)

## 2015-08-19 LAB — CBC
HCT: 48.6 % (ref 39.0–52.0)
HEMOGLOBIN: 17.1 g/dL — AB (ref 13.0–17.0)
MCH: 31.6 pg (ref 26.0–34.0)
MCHC: 35.2 g/dL (ref 30.0–36.0)
MCV: 89.8 fL (ref 78.0–100.0)
Platelets: 392 10*3/uL (ref 150–400)
RBC: 5.41 MIL/uL (ref 4.22–5.81)
RDW: 13.2 % (ref 11.5–15.5)
WBC: 14.9 10*3/uL — ABNORMAL HIGH (ref 4.0–10.5)

## 2015-08-19 LAB — LIPASE, BLOOD: Lipase: 23 U/L (ref 11–51)

## 2015-08-19 MED ORDER — ONDANSETRON 4 MG PO TBDP
4.0000 mg | ORAL_TABLET | Freq: Once | ORAL | Status: AC | PRN
  Administered 2015-08-19: 4 mg via ORAL
  Filled 2015-08-19: qty 1

## 2015-08-19 NOTE — ED Notes (Signed)
Pt states vomiting, nausea, and generalized abdominal pain x 4 days. Unable to keep down any foods/fluids, also complaining of chills. Denies any other complaints.

## 2015-08-21 ENCOUNTER — Emergency Department (HOSPITAL_COMMUNITY): Payer: Self-pay

## 2015-08-21 ENCOUNTER — Emergency Department (HOSPITAL_COMMUNITY)
Admission: EM | Admit: 2015-08-21 | Discharge: 2015-08-21 | Disposition: A | Discharge status: Home / Self Care | Payer: Self-pay | Attending: Emergency Medicine | Admitting: Emergency Medicine

## 2015-08-21 ENCOUNTER — Encounter (HOSPITAL_COMMUNITY): Payer: Self-pay | Admitting: Family Medicine

## 2015-08-21 DIAGNOSIS — R197 Diarrhea, unspecified: Secondary | ICD-10-CM | POA: Insufficient documentation

## 2015-08-21 DIAGNOSIS — R1013 Epigastric pain: Secondary | ICD-10-CM | POA: Insufficient documentation

## 2015-08-21 DIAGNOSIS — Z8639 Personal history of other endocrine, nutritional and metabolic disease: Secondary | ICD-10-CM | POA: Insufficient documentation

## 2015-08-21 DIAGNOSIS — R1010 Upper abdominal pain, unspecified: Secondary | ICD-10-CM | POA: Insufficient documentation

## 2015-08-21 DIAGNOSIS — Z8679 Personal history of other diseases of the circulatory system: Secondary | ICD-10-CM | POA: Insufficient documentation

## 2015-08-21 DIAGNOSIS — F17210 Nicotine dependence, cigarettes, uncomplicated: Secondary | ICD-10-CM | POA: Insufficient documentation

## 2015-08-21 DIAGNOSIS — Z7951 Long term (current) use of inhaled steroids: Secondary | ICD-10-CM | POA: Insufficient documentation

## 2015-08-21 DIAGNOSIS — Z8669 Personal history of other diseases of the nervous system and sense organs: Secondary | ICD-10-CM | POA: Insufficient documentation

## 2015-08-21 DIAGNOSIS — R6883 Chills (without fever): Secondary | ICD-10-CM | POA: Insufficient documentation

## 2015-08-21 DIAGNOSIS — R112 Nausea with vomiting, unspecified: Secondary | ICD-10-CM | POA: Insufficient documentation

## 2015-08-21 LAB — COMPREHENSIVE METABOLIC PANEL
ALT: 18 U/L (ref 17–63)
AST: 18 U/L (ref 15–41)
Albumin: 4.3 g/dL (ref 3.5–5.0)
Alkaline Phosphatase: 51 U/L (ref 38–126)
Anion gap: 14 (ref 5–15)
BILIRUBIN TOTAL: 1.8 mg/dL — AB (ref 0.3–1.2)
BUN: 9 mg/dL (ref 6–20)
CALCIUM: 9.6 mg/dL (ref 8.9–10.3)
CO2: 22 mmol/L (ref 22–32)
CREATININE: 0.79 mg/dL (ref 0.61–1.24)
Chloride: 103 mmol/L (ref 101–111)
GLUCOSE: 94 mg/dL (ref 65–99)
Potassium: 3.7 mmol/L (ref 3.5–5.1)
Sodium: 139 mmol/L (ref 135–145)
Total Protein: 7.2 g/dL (ref 6.5–8.1)

## 2015-08-21 LAB — CBC WITH DIFFERENTIAL/PLATELET
Basophils Absolute: 0 10*3/uL (ref 0.0–0.1)
Basophils Relative: 0 %
EOS ABS: 0 10*3/uL (ref 0.0–0.7)
EOS PCT: 0 %
HCT: 45.8 % (ref 39.0–52.0)
HEMOGLOBIN: 16.2 g/dL (ref 13.0–17.0)
LYMPHS ABS: 2.3 10*3/uL (ref 0.7–4.0)
Lymphocytes Relative: 22 %
MCH: 31.5 pg (ref 26.0–34.0)
MCHC: 35.4 g/dL (ref 30.0–36.0)
MCV: 88.9 fL (ref 78.0–100.0)
MONOS PCT: 9 %
Monocytes Absolute: 1 10*3/uL (ref 0.1–1.0)
NEUTROS PCT: 69 %
Neutro Abs: 7.1 10*3/uL (ref 1.7–7.7)
Platelets: 331 10*3/uL (ref 150–400)
RBC: 5.15 MIL/uL (ref 4.22–5.81)
RDW: 13.4 % (ref 11.5–15.5)
WBC: 10.4 10*3/uL (ref 4.0–10.5)

## 2015-08-21 LAB — LIPASE, BLOOD: Lipase: 18 U/L (ref 11–51)

## 2015-08-21 MED ORDER — SODIUM CHLORIDE 0.9 % IV BOLUS (SEPSIS)
1000.0000 mL | Freq: Once | INTRAVENOUS | Status: AC
  Administered 2015-08-21: 1000 mL via INTRAVENOUS

## 2015-08-21 MED ORDER — ONDANSETRON 4 MG PO TBDP: ORAL_TABLET | ORAL | Status: DC

## 2015-08-21 MED ORDER — GI COCKTAIL ~~LOC~~
30.0000 mL | Freq: Once | ORAL | Status: AC
  Administered 2015-08-21: 30 mL via ORAL
  Filled 2015-08-21: qty 30

## 2015-08-21 MED ORDER — DICYCLOMINE HCL 10 MG PO CAPS
20.0000 mg | ORAL_CAPSULE | Freq: Once | ORAL | Status: AC
  Administered 2015-08-21: 20 mg via ORAL
  Filled 2015-08-21: qty 2

## 2015-08-21 MED ORDER — ONDANSETRON HCL 4 MG/2ML IJ SOLN
4.0000 mg | Freq: Once | INTRAMUSCULAR | Status: AC
  Administered 2015-08-21: 4 mg via INTRAVENOUS
  Filled 2015-08-21: qty 2

## 2015-08-21 NOTE — ED Provider Notes (Signed)
CSN: [id number]     Arrival date & time 08/21/15  1015 History   First MD Initiated Contact with Patient 08/21/15 1021     Chief Complaint  Patient presents with  . Abdominal Pain  . Emesis     (Consider location/radiation/quality/duration/timing/severity/associated sxs/prior Treatment) HPI  30 year old male presents with 5 days of nausea and vomiting. States he is having 1 loose watery bowel movement per day but otherwise is mostly vomiting. Unable to keep fluids or food down. Also having upper abdominal pain. Similar to when he was here last month. He states after large amounts of vomiting occasionally he has specks of blood but no large volume of hematemesis. No chest pain. Denies any fevers but has been having some chills. Is unsure if he ate something that could cause this. Tried over-the-counter nausea medicine that is not know the name of but has had no relief. Rates his pain as a 5/10, mostly epigastric.  Past Medical History  Diagnosis Date  . Migraine   . Insomnia   . Hyperlipidemia    Past Surgical History  Procedure Laterality Date  . Tonsillectomy    . Inner ear surgery     Family History  Problem Relation Age of Onset  . Diabetes Mother   . Hypertension Mother   . Asthma Mother   . Cancer Father   . Stroke Father   . Thyroid disease Father    Social History  Substance Use Topics  . Smoking status: Current Every Day Smoker    Types: Cigarettes  . Smokeless tobacco: None  . Alcohol Use: 14.4 oz/week    24 Cans of beer per week     Comment: weekly    Review of Systems  Constitutional: Positive for chills. Negative for fever.  Respiratory: Negative for shortness of breath.   Cardiovascular: Negative for chest pain.  Gastrointestinal: Positive for nausea, vomiting, abdominal pain and diarrhea.  All other systems reviewed and are negative.     Allergies  Review of patient's allergies indicates no known allergies.  Home Medications   Prior to  Admission medications   Medication Sig Start Date End Date Taking? Authorizing Provider  acetaminophen (TYLENOL) 325 MG tablet Take 650 mg by mouth every 6 (six) hours as needed for moderate pain.    Historical Provider, MD  amoxicillin-clavulanate (AUGMENTIN) 875-125 MG tablet Take 1 tablet by mouth 2 (two) times daily. Take with food Patient not taking: Reported on 07/21/2015 05/16/15   Elson Areas, PA-C  dicyclomine (BENTYL) 20 MG tablet Take 1 tablet (20 mg total) by mouth 2 (two) times daily. 07/21/15   Shawn C Joy, PA-C  fluticasone (FLONASE) 50 MCG/ACT nasal spray Place two sprays in each nostril once daily Patient not taking: Reported on 11/30/2014 06/23/14   Lattie Haw, MD  ibuprofen (ADVIL,MOTRIN) 800 MG tablet Take 1 tablet (800 mg total) by mouth 3 (three) times daily. 05/16/15   Elson Areas, PA-C  metoCLOPramide (REGLAN) 10 MG tablet Take 1 tablet (10 mg total) by mouth every 6 (six) hours. 12/24/14   Hope Orlene Och, NP  ondansetron (ZOFRAN ODT) 4 MG disintegrating tablet Take 1 tablet (4 mg total) by mouth every 8 (eight) hours as needed for nausea or vomiting. 07/21/15   Shawn C Joy, PA-C  ranitidine (ZANTAC) 150 MG tablet Take 1 tablet (150 mg total) by mouth 2 (two) times daily. 12/24/14   Hope Orlene Och, NP   BP 144/89 mmHg  Pulse 75  Temp(Src) 97.8  F (36.6 C) (Oral)  Resp 18  SpO2 100% Physical Exam  Constitutional: He is oriented to person, place, and time. He appears well-developed and well-nourished.  HENT:  Head: Normocephalic and atraumatic.  Right Ear: External ear normal.  Left Ear: External ear normal.  Nose: Nose normal.  Mouth/Throat: Mucous membranes are dry.  Eyes: Right eye exhibits no discharge. Left eye exhibits no discharge.  Neck: Neck supple.  Cardiovascular: Normal rate, regular rhythm, normal heart sounds and intact distal pulses.   Pulmonary/Chest: Effort normal and breath sounds normal.  Abdominal: Soft. There is tenderness in the epigastric  area.  Musculoskeletal: He exhibits no edema.  Neurological: He is alert and oriented to person, place, and time.  Skin: Skin is warm and dry.  Nursing note and vitals reviewed.   ED Course  Procedures (including critical care time) Labs Review Labs Reviewed  COMPREHENSIVE METABOLIC PANEL - Abnormal; Notable for the following:    Total Bilirubin 1.8 (*)    All other components within normal limits  LIPASE, BLOOD  CBC WITH DIFFERENTIAL/PLATELET    Imaging Review Dg Abd 1 View  08/21/2015  CLINICAL DATA:  30 year old male with 5 days of nausea and vomiting as well as lower abdominal pain EXAM: ABDOMEN - 1 VIEW COMPARISON:  Prior CT abdomen/ pelvis 12/24/2014 FINDINGS: Gas is noted within several loops of borderline to minimally dilated small bowel in the left hemi abdomen. Gas is noted throughout the colon which is unremarkable. No evidence of large free air on this single supine radiograph. The bony structures are within normal limits. No organomegaly or abnormal calcification. IMPRESSION: Gas within a borderline to minimally dilated loops of small bowel in the left hemi abdomen. No definite obstruction. Differential considerations include infectious/inflammatory enteritis versus reactive ileus. Electronically Signed   By: Malachy Moan M.D.   On: 08/21/2015 12:36   US Abdomen Limited  08/21/2015  CLINICAL DATA:  Abdominal pain, nausea vomiting for 5 days PE EXAM: US ABDOMEN LIMITED - RIGHT UPPER QUADRANT COMPARISON:  CT of the abdomen 12/23/2013 FINDINGS: Gallbladder: No gallstones or wall thickening visualized. No sonographic Murphy sign noted by sonographer. Common bile duct: Diameter: 3.3 mm. Liver: No focal lesion identified. Within normal limits in parenchymal echogenicity. Normal direction of flow of the main portal vein. IMPRESSION: Normal right upper quadrant abdominal ultrasound. Electronically Signed   By: Ted Mcalpine M.D.   On: 08/21/2015 13:15   I have personally  reviewed and evaluated these images and lab results as part of my medical decision-making.   EKG Interpretation None      MDM   Final diagnoses:  Nausea and vomiting in adult  Upper abdominal pain    Patient feels significant better after Zofran and fluids in the ER. He is now tolerating oral fluids. Pain has improved. Given mild bilirubin elevation, ultrasound obtained ndshows no gallbladder disease. Patient feels well to go home and appears much better. Given strict return precautions. Will discharge with Zofran. Patient states that he smokes marijuana but has not done so in over one month, I did discuss that this could be a cause of his repeated vomiting and advised him to stop.    Pricilla Loveless, MD 08/21/15 [phone]

## 2015-08-21 NOTE — ED Notes (Signed)
Pt presents from home via POV with c/o abdominal pain with NV x5 days. Tenderness to central abdomen on palpation. Pt is A&Ox4

## 2015-08-21 NOTE — ED Notes (Signed)
Pt requests to wait on PO meds until IV Zofran has decreased Nausea.

## 2015-09-22 ENCOUNTER — Emergency Department (HOSPITAL_COMMUNITY)
Admission: EM | Admit: 2015-09-22 | Discharge: 2015-09-22 | Disposition: A | Discharge status: Home / Self Care | Payer: Self-pay | Attending: Emergency Medicine | Admitting: Emergency Medicine

## 2015-09-22 ENCOUNTER — Encounter (HOSPITAL_COMMUNITY): Payer: Self-pay | Admitting: Cardiology

## 2015-09-22 DIAGNOSIS — R112 Nausea with vomiting, unspecified: Secondary | ICD-10-CM | POA: Insufficient documentation

## 2015-09-22 DIAGNOSIS — F17210 Nicotine dependence, cigarettes, uncomplicated: Secondary | ICD-10-CM | POA: Insufficient documentation

## 2015-09-22 DIAGNOSIS — E860 Dehydration: Secondary | ICD-10-CM | POA: Insufficient documentation

## 2015-09-22 DIAGNOSIS — R1011 Right upper quadrant pain: Secondary | ICD-10-CM | POA: Insufficient documentation

## 2015-09-22 DIAGNOSIS — Z8679 Personal history of other diseases of the circulatory system: Secondary | ICD-10-CM | POA: Insufficient documentation

## 2015-09-22 DIAGNOSIS — R1031 Right lower quadrant pain: Secondary | ICD-10-CM | POA: Insufficient documentation

## 2015-09-22 DIAGNOSIS — R197 Diarrhea, unspecified: Secondary | ICD-10-CM | POA: Insufficient documentation

## 2015-09-22 DIAGNOSIS — K002 Abnormalities of size and form of teeth: Secondary | ICD-10-CM | POA: Insufficient documentation

## 2015-09-22 DIAGNOSIS — Z8639 Personal history of other endocrine, nutritional and metabolic disease: Secondary | ICD-10-CM | POA: Insufficient documentation

## 2015-09-22 DIAGNOSIS — Z8669 Personal history of other diseases of the nervous system and sense organs: Secondary | ICD-10-CM | POA: Insufficient documentation

## 2015-09-22 DIAGNOSIS — R1033 Periumbilical pain: Secondary | ICD-10-CM | POA: Insufficient documentation

## 2015-09-22 LAB — COMPREHENSIVE METABOLIC PANEL
ALK PHOS: 55 U/L (ref 38–126)
ALT: 23 U/L (ref 17–63)
AST: 28 U/L (ref 15–41)
Albumin: 4.6 g/dL (ref 3.5–5.0)
Anion gap: 11 (ref 5–15)
BUN: 10 mg/dL (ref 6–20)
CHLORIDE: 107 mmol/L (ref 101–111)
CO2: 22 mmol/L (ref 22–32)
CREATININE: 0.84 mg/dL (ref 0.61–1.24)
Calcium: 9.9 mg/dL (ref 8.9–10.3)
GFR calc Af Amer: >60 mL/min (ref >60)
Glucose, Bld: 117 mg/dL — ABNORMAL HIGH (ref 65–99)
Potassium: 4.3 mmol/L (ref 3.5–5.1)
Sodium: 140 mmol/L (ref 135–145)
Total Bilirubin: 1.1 mg/dL (ref 0.3–1.2)
Total Protein: 7.9 g/dL (ref 6.5–8.1)

## 2015-09-22 LAB — URINALYSIS, ROUTINE W REFLEX MICROSCOPIC
Bilirubin Urine: NEGATIVE
GLUCOSE, UA: NEGATIVE mg/dL
HGB URINE DIPSTICK: NEGATIVE
KETONES UR: 40 mg/dL — AB
LEUKOCYTES UA: NEGATIVE
Nitrite: NEGATIVE
PROTEIN: NEGATIVE mg/dL
Specific Gravity, Urine: 1.02 (ref 1.005–1.030)
pH: 6.5 (ref 5.0–8.0)

## 2015-09-22 LAB — CBC
HCT: 48.8 % (ref 39.0–52.0)
Hemoglobin: 17.2 g/dL — ABNORMAL HIGH (ref 13.0–17.0)
MCH: 32.5 pg (ref 26.0–34.0)
MCHC: 35.2 g/dL (ref 30.0–36.0)
MCV: 92.1 fL (ref 78.0–100.0)
PLATELETS: 340 10*3/uL (ref 150–400)
RBC: 5.3 MIL/uL (ref 4.22–5.81)
RDW: 14.1 % (ref 11.5–15.5)
WBC: 14.4 10*3/uL — AB (ref 4.0–10.5)

## 2015-09-22 LAB — LIPASE, BLOOD: LIPASE: 23 U/L (ref 11–51)

## 2015-09-22 MED ORDER — PROMETHAZINE HCL 25 MG PO TABS: 25.0000 mg | ORAL_TABLET | Freq: Four times a day (QID) | ORAL | Status: DC | PRN

## 2015-09-22 MED ORDER — PANTOPRAZOLE SODIUM 40 MG IV SOLR
40.0000 mg | INTRAVENOUS | Status: AC
  Administered 2015-09-22: 40 mg via INTRAVENOUS
  Filled 2015-09-22: qty 40

## 2015-09-22 MED ORDER — ONDANSETRON HCL 4 MG/2ML IJ SOLN
4.0000 mg | Freq: Once | INTRAMUSCULAR | Status: AC | PRN
  Administered 2015-09-22: 4 mg via INTRAVENOUS
  Filled 2015-09-22: qty 2

## 2015-09-22 MED ORDER — GI COCKTAIL ~~LOC~~
30.0000 mL | Freq: Once | ORAL | Status: DC
  Filled 2015-09-22: qty 30

## 2015-09-22 MED ORDER — FAMOTIDINE IN NACL 20-0.9 MG/50ML-% IV SOLN
20.0000 mg | Freq: Once | INTRAVENOUS | Status: AC
  Administered 2015-09-22: 20 mg via INTRAVENOUS
  Filled 2015-09-22: qty 50

## 2015-09-22 MED ORDER — PROMETHAZINE HCL 25 MG/ML IJ SOLN
25.0000 mg | Freq: Once | INTRAMUSCULAR | Status: AC
  Administered 2015-09-22: 25 mg via INTRAVENOUS
  Filled 2015-09-22: qty 1

## 2015-09-22 MED ORDER — ONDANSETRON 4 MG PO TBDP: 4.0000 mg | ORAL_TABLET | Freq: Three times a day (TID) | ORAL | Status: DC | PRN

## 2015-09-22 MED ORDER — ONDANSETRON 4 MG PO TBDP
ORAL_TABLET | ORAL | Status: AC
  Filled 2015-09-22: qty 1

## 2015-09-22 MED ORDER — SODIUM CHLORIDE 0.9 % IV BOLUS (SEPSIS)
1000.0000 mL | Freq: Once | INTRAVENOUS | Status: AC
  Administered 2015-09-22: 1000 mL via INTRAVENOUS

## 2015-09-22 MED ORDER — ONDANSETRON HCL 4 MG/2ML IJ SOLN
4.0000 mg | INTRAMUSCULAR | Status: DC
  Administered 2015-09-22: 4 mg via INTRAVENOUS
  Filled 2015-09-22: qty 2

## 2015-09-22 NOTE — ED Notes (Signed)
Pt reports abd pain with diarrhea for the past couple of weeks. States he also had an episode of chest pain.

## 2015-09-22 NOTE — ED Notes (Signed)
Pt voided, sample sent to lab.

## 2015-09-22 NOTE — ED Notes (Signed)
PT reports nausea unrelikved

## 2015-09-22 NOTE — Discharge Instructions (Signed)

## 2015-09-22 NOTE — ED Provider Notes (Addendum)
CSN: [id number]     Arrival date & time 09/22/15  4098 History   First MD Initiated Contact with Patient 09/22/15 1003     Chief Complaint  Patient presents with  . Abdominal Pain    diarrhea  . Emesis     (Consider location/radiation/quality/duration/timing/severity/associated sxs/prior Treatment) HPI  The patient is a 30 year old male, he has a history of intermittent abdominal discomfort associated with nausea vomiting and diarrhea. Review of the medical record shows that he has been here monthly for the last several months for similar symptoms but he states that in between these episodes he is back to normal without any symptoms whatsoever. This current episode started approximately 3 days ago, it has been persistent, he reports between 10 and 15 episodes of vomiting and watery diarrhea per day. He describes some abdominal discomfort which also comes and goes. He has had a prior workup of an ultrasound of the right upper quadrant done 1 month ago which was totally normal, his lab work in the past has also been nonfocal. His CT scan performed in May 2016 was unremarkable. He does report intermittently using marijuana  Past Medical History  Diagnosis Date  . Migraine   . Insomnia   . Hyperlipidemia    Past Surgical History  Procedure Laterality Date  . Tonsillectomy    . Inner ear surgery     Family History  Problem Relation Age of Onset  . Diabetes Mother   . Hypertension Mother   . Asthma Mother   . Cancer Father   . Stroke Father   . Thyroid disease Father    Social History  Substance Use Topics  . Smoking status: Current Every Day Smoker    Types: Cigarettes  . Smokeless tobacco: None  . Alcohol Use: 14.4 oz/week    24 Cans of beer per week     Comment: weekly    Review of Systems  All other systems reviewed and are negative.     Allergies  Review of patient's allergies indicates no known allergies.  Home Medications   Prior to Admission medications    Medication Sig Start Date End Date Taking? Authorizing Provider  amoxicillin-clavulanate (AUGMENTIN) 875-125 MG tablet Take 1 tablet by mouth 2 (two) times daily. Take with food Patient not taking: Reported on 07/21/2015 05/16/15   Elson Areas, PA-C  dicyclomine (BENTYL) 20 MG tablet Take 1 tablet (20 mg total) by mouth 2 (two) times daily. Patient not taking: Reported on 09/22/2015 07/21/15   Anselm Pancoast, PA-C  fluticasone [facility]) 50 MCG/ACT nasal spray Place two sprays in each nostril once daily Patient not taking: Reported on 11/30/2014 06/23/14   Lattie Haw, MD  ibuprofen (ADVIL,MOTRIN) 800 MG tablet Take 1 tablet (800 mg total) by mouth 3 (three) times daily. Patient not taking: Reported on 09/22/2015 05/16/15   Elson Areas, PA-C  metoCLOPramide (REGLAN) 10 MG tablet Take 1 tablet (10 mg total) by mouth every 6 (six) hours. Patient not taking: Reported on 09/22/2015 12/24/14   Janne Napoleon, NP  ondansetron (ZOFRAN ODT) 4 MG disintegrating tablet Take 1 tablet (4 mg total) by mouth every 8 (eight) hours as needed for nausea. 09/22/15   Eber Hong, MD  promethazine (PHENERGAN) 25 MG tablet Take 1 tablet (25 mg total) by mouth every 6 (six) hours as needed for nausea or vomiting. 09/22/15   Eber Hong, MD  ranitidine (ZANTAC) 150 MG tablet Take 1 tablet (150 mg total) by mouth 2 (two)  times daily. Patient not taking: Reported on 09/22/2015 12/24/14   Janne Napoleon, NP   BP 150/85 mmHg  Pulse 61  Temp(Src) 98.1 F (36.7 C) (Oral)  Resp 18  SpO2 100% Physical Exam  Constitutional: He appears well-developed and well-nourished. No distress.  HENT:  Head: Normocephalic and atraumatic.  Mouth/Throat: Oropharynx is clear and moist. No oropharyngeal exudate.  Poor dentition  Eyes: Conjunctivae and EOM are normal. Pupils are equal, round, and reactive to light. Right eye exhibits no discharge. Left eye exhibits no discharge. No scleral icterus.  Neck: Normal range of motion. Neck  supple. No JVD present. No thyromegaly present.  Cardiovascular: Normal rate, regular rhythm, normal heart sounds and intact distal pulses.  Exam reveals no gallop and no friction rub.   No murmur heard. Pulmonary/Chest: Effort normal and breath sounds normal. No respiratory distress. He has no wheezes. He has no rales.  Abdominal: Soft. Bowel sounds are normal. He exhibits no distension and no mass. There is tenderness ( RUQ, periumbilical, RLQ).  No ttp on the L - no guarding anywhere, no peritoneal signs, no tympanitic sounds to percussion - no pain with percussion  Musculoskeletal: Normal range of motion. He exhibits no edema or tenderness.  Lymphadenopathy:    He has no cervical adenopathy.  Neurological: He is alert. Coordination normal.  Skin: Skin is warm and dry. No rash noted. No erythema.  Psychiatric: He has a normal mood and affect. His behavior is normal.  Nursing note and vitals reviewed.   ED Course  Procedures (including critical care time) Labs Review Labs Reviewed  COMPREHENSIVE METABOLIC PANEL - Abnormal; Notable for the following:    Glucose, Bld 117 (*)    All other components within normal limits  CBC - Abnormal; Notable for the following:    WBC 14.4 (*)    Hemoglobin 17.2 (*)    All other components within normal limits  URINALYSIS, ROUTINE W REFLEX MICROSCOPIC (NOT AT [facility]) - Abnormal; Notable for the following:    Color, Urine AMBER (*)    Ketones, ur 40 (*)    All other components within normal limits  LIPASE, BLOOD    Imaging Review No results found. I have personally reviewed and evaluated these images and lab results as part of my medical decision-making.   EKG Interpretation   Date/Time:  Wednesday September 22 2015 08:46:27 EST Ventricular Rate:  76 PR Interval:  124 QRS Duration: 94 QT Interval:  398 QTC Calculation: 447 R Axis:   89 Text Interpretation:  Normal sinus rhythm with sinus arrhythmia Normal ECG  since last tracing no  significant change Confirmed by Loie Jahr  MD, Isamar Nazir  ([id number]) on 09/22/2015 10:04:17 AM      MDM   Final diagnoses:  Dehydration  Non-intractable vomiting with nausea, vomiting of unspecified type    Nonspecific abdominal discomfort with vomiting and diarrhea. Nonspecific leukocytosis is present but, per his metabolic panel and lipase are normal. Vital signs are unremarkable, his overall exam is unremarkable except for some scattered abdominal tenderness. He does not have guarding or peritoneal signs, he does not have a Murphy sign or specific pain at McBurney's point alone. The patient will get antiemetics, Pepcid and Protonix.  Improved with meds - still nauseated but no vomiting or diarrhea - has had 2L of IVF, and meds as below Given verbal and writtent d/c instrutions and expressed understanding and agreement to both.  UA and labs unremarkable other than non specific leukocytosis.  Meds given in  ED:  Medications  ondansetron (ZOFRAN-ODT) 4 MG disintegrating tablet (not administered)  gi cocktail (Maalox,Lidocaine,Donnatal) (30 mLs Oral Refused 09/22/15 1049)  ondansetron (ZOFRAN) injection 4 mg (4 mg Intravenous Given 09/22/15 1422)  ondansetron (ZOFRAN) injection 4 mg (4 mg Intravenous Given 09/22/15 1026)  famotidine (PEPCID) IVPB 20 mg premix (0 mg Intravenous Stopped 09/22/15 1245)  pantoprazole (PROTONIX) injection 40 mg (40 mg Intravenous Given 09/22/15 1049)  sodium chloride 0.9 % bolus 1,000 mL (0 mLs Intravenous Stopped 09/22/15 1245)  promethazine (PHENERGAN) injection 25 mg (25 mg Intravenous Given 09/22/15 1106)  sodium chloride 0.9 % bolus 1,000 mL (0 mLs Intravenous Stopped 09/22/15 1416)    New Prescriptions   ONDANSETRON (ZOFRAN ODT) 4 MG DISINTEGRATING TABLET    Take 1 tablet (4 mg total) by mouth every 8 (eight) hours as needed for nausea.   PROMETHAZINE (PHENERGAN) 25 MG TABLET    Take 1 tablet (25 mg total) by mouth every 6 (six) hours as needed for nausea or vomiting.       Eber Hong, MD 09/22/15 1502  Eber Hong, MD 09/22/15 [phone]

## 2015-09-22 NOTE — ED Notes (Signed)
Pt out of bed bent over actively vomiting.

## 2015-11-05 ENCOUNTER — Encounter (HOSPITAL_COMMUNITY): Payer: Self-pay | Admitting: Emergency Medicine

## 2015-11-05 ENCOUNTER — Emergency Department (HOSPITAL_COMMUNITY)
Admission: EM | Admit: 2015-11-05 | Discharge: 2015-11-05 | Discharge status: Against Medical Advice | Payer: Self-pay | Attending: Emergency Medicine | Admitting: Emergency Medicine

## 2015-11-05 DIAGNOSIS — R112 Nausea with vomiting, unspecified: Secondary | ICD-10-CM | POA: Insufficient documentation

## 2015-11-05 DIAGNOSIS — Z8639 Personal history of other endocrine, nutritional and metabolic disease: Secondary | ICD-10-CM | POA: Insufficient documentation

## 2015-11-05 DIAGNOSIS — R34 Anuria and oliguria: Secondary | ICD-10-CM | POA: Insufficient documentation

## 2015-11-05 DIAGNOSIS — R1013 Epigastric pain: Secondary | ICD-10-CM | POA: Insufficient documentation

## 2015-11-05 DIAGNOSIS — Z8679 Personal history of other diseases of the circulatory system: Secondary | ICD-10-CM | POA: Insufficient documentation

## 2015-11-05 DIAGNOSIS — G4700 Insomnia, unspecified: Secondary | ICD-10-CM | POA: Insufficient documentation

## 2015-11-05 DIAGNOSIS — Z79899 Other long term (current) drug therapy: Secondary | ICD-10-CM | POA: Insufficient documentation

## 2015-11-05 DIAGNOSIS — F17210 Nicotine dependence, cigarettes, uncomplicated: Secondary | ICD-10-CM | POA: Insufficient documentation

## 2015-11-05 LAB — COMPREHENSIVE METABOLIC PANEL
ALBUMIN: 4.3 g/dL (ref 3.5–5.0)
ALK PHOS: 41 U/L (ref 38–126)
ALT: 21 U/L (ref 17–63)
AST: 24 U/L (ref 15–41)
Anion gap: 11 (ref 5–15)
BUN: 13 mg/dL (ref 6–20)
CALCIUM: 9.7 mg/dL (ref 8.9–10.3)
CHLORIDE: 103 mmol/L (ref 101–111)
CO2: 22 mmol/L (ref 22–32)
CREATININE: 1.04 mg/dL (ref 0.61–1.24)
GFR calc non Af Amer: >60 mL/min (ref >60)
GLUCOSE: 108 mg/dL — AB (ref 65–99)
Potassium: 3.5 mmol/L (ref 3.5–5.1)
SODIUM: 136 mmol/L (ref 135–145)
Total Bilirubin: 1.5 mg/dL — ABNORMAL HIGH (ref 0.3–1.2)
Total Protein: 7.2 g/dL (ref 6.5–8.1)

## 2015-11-05 LAB — CBC WITH DIFFERENTIAL/PLATELET
BASOS ABS: 0 10*3/uL (ref 0.0–0.1)
BASOS PCT: 0 %
EOS ABS: 0 10*3/uL (ref 0.0–0.7)
EOS PCT: 0 %
HCT: 46.1 % (ref 39.0–52.0)
HEMOGLOBIN: 15.9 g/dL (ref 13.0–17.0)
LYMPHS ABS: 1.6 10*3/uL (ref 0.7–4.0)
Lymphocytes Relative: 15 %
MCH: 31.3 pg (ref 26.0–34.0)
MCHC: 34.5 g/dL (ref 30.0–36.0)
MCV: 90.7 fL (ref 78.0–100.0)
Monocytes Absolute: 1 10*3/uL (ref 0.1–1.0)
Monocytes Relative: 9 %
NEUTROS ABS: 8.1 10*3/uL — AB (ref 1.7–7.7)
Neutrophils Relative %: 76 %
PLATELETS: 316 10*3/uL (ref 150–400)
RBC: 5.08 MIL/uL (ref 4.22–5.81)
RDW: 13.2 % (ref 11.5–15.5)
WBC: 10.7 10*3/uL — ABNORMAL HIGH (ref 4.0–10.5)

## 2015-11-05 LAB — LIPASE, BLOOD: Lipase: 21 U/L (ref 11–51)

## 2015-11-05 MED ORDER — FAMOTIDINE IN NACL 20-0.9 MG/50ML-% IV SOLN: 20.0000 mg | Freq: Once | INTRAVENOUS | Status: DC

## 2015-11-05 MED ORDER — ONDANSETRON HCL 4 MG/2ML IJ SOLN: 4.0000 mg | Freq: Once | INTRAMUSCULAR | Status: DC

## 2015-11-05 MED ORDER — SODIUM CHLORIDE 0.9 % IV BOLUS (SEPSIS): 2000.0000 mL | Freq: Once | INTRAVENOUS | Status: DC

## 2015-11-05 NOTE — ED Notes (Signed)
Patient is being loud and hostile with Amy, EMT. Patient refusing to let this EMT talk. This EMT is explaining herself, and trying to get the patient to listen to what she is saying. Patient continues to get louder with EMT.

## 2015-11-05 NOTE — ED Notes (Signed)
Pt ambulated out of hospital after pt got dressed and sts he did not want to wait any longer

## 2015-11-05 NOTE — ED Provider Notes (Signed)
CSN: [id number]     Arrival date & time 11/05/15  [id number] History   First MD Initiated Contact with Patient 11/05/15 0912     [id number] Complaint  Patient presents with  . Emesis     (Consider location/radiation/quality/duration/timing/severity/associated sxs/prior Treatment) HPI  Blood pressure 132/78, pulse 68, temperature 97.9 F (36.6 C), temperature source Oral, resp. rate 18, SpO2 97 %.  Christopher Estes is a 30 y.o. male complaining of multiple episodes of emesis onset 3 days ago with blood streaking onset last night. Last emesis was 30 minutes prior to arrival. Patient denies melena, hematochezia, states he's has reduced urine output and 6 out of 10 epigastric abdominal pain starting after the vomiting. Patient states that he used to drink heavily, 3 beers daily but he quit 2 weeks ago. He denies excessive NSAID use, history of liver issues.  Past Medical History  Diagnosis Date  . Migraine   . Insomnia   . Hyperlipidemia    Past Surgical History  Procedure Laterality Date  . Tonsillectomy    . Inner ear surgery     Family History  Problem Relation Age of Onset  . Diabetes Mother   . Hypertension Mother   . Asthma Mother   . Cancer Father   . Stroke Father   . Thyroid disease Father    Social History  Substance Use Topics  . Smoking status: Current Every Day Smoker    Types: Cigarettes  . Smokeless tobacco: None  . Alcohol Use: 14.4 oz/week    24 Cans of beer per week     Comment: weekly    Review of Systems  10 systems reviewed and found to be negative, except as noted in the HPI.   Allergies  Review of patient's allergies indicates no known allergies.  Home Medications   Prior to Admission medications   Medication Sig Start Date End Date Taking? Authorizing Provider  Melatonin 3 MG TABS Take 9 mg by mouth at bedtime.   Yes Historical Provider, MD  PAPAYA PO Take 4 tablets by mouth daily. Chewable   Yes Historical Provider, MD  amoxicillin-clavulanate  (AUGMENTIN) 875-125 MG tablet Take 1 tablet by mouth 2 (two) times daily. Take with food Patient not taking: Reported on 07/21/2015 05/16/15   Elson AreasLeslie K Sofia, PA-C  dicyclomine (BENTYL) 20 MG tablet Take 1 tablet (20 mg total) by mouth 2 (two) times daily. Patient not taking: Reported on 09/22/2015 07/21/15   Anselm PancoastShawn C Joy, PA-C  fluticasone [facility](FLONASE) 50 MCG/ACT nasal spray Place two sprays in each nostril once daily Patient not taking: Reported on 11/30/2014 06/23/14   Lattie HawStephen A Beese, MD  ibuprofen (ADVIL,MOTRIN) 800 MG tablet Take 1 tablet (800 mg total) by mouth 3 (three) times daily. Patient not taking: Reported on 09/22/2015 05/16/15   Elson AreasLeslie K Sofia, PA-C  metoCLOPramide (REGLAN) 10 MG tablet Take 1 tablet (10 mg total) by mouth every 6 (six) hours. Patient not taking: Reported on 09/22/2015 12/24/14   Janne NapoleonHope M Neese, NP  ondansetron (ZOFRAN ODT) 4 MG disintegrating tablet Take 1 tablet (4 mg total) by mouth every 8 (eight) hours as needed for nausea. Patient not taking: Reported on 11/05/2015 09/22/15   Eber HongBrian Miller, MD  promethazine (PHENERGAN) 25 MG tablet Take 1 tablet (25 mg total) by mouth every 6 (six) hours as needed for nausea or vomiting. Patient not taking: Reported on 11/05/2015 09/22/15   Eber HongBrian Miller, MD  ranitidine (ZANTAC) 150 MG tablet Take 1 tablet (150 mg total) by mouth  2 (two) times daily. Patient not taking: Reported on 09/22/2015 12/24/14   Janne Napoleon, NP   BP 132/78 mmHg  Pulse 68  Temp(Src) 97.9 F (36.6 C) (Oral)  Resp 18  SpO2 97% Physical Exam  Constitutional: He is oriented to person, place, and time. He appears well-developed and well-nourished. No distress.  HENT:  Head: Normocephalic and atraumatic.  Mouth/Throat: Oropharynx is clear and moist.  Eyes: Conjunctivae and EOM are normal. Pupils are equal, round, and reactive to light.  Neck: Normal range of motion.  Cardiovascular: Normal rate, regular rhythm and intact distal pulses.   Pulmonary/Chest: Effort normal  and breath sounds normal.  Abdominal: Soft. Bowel sounds are normal. He exhibits no distension and no mass. There is tenderness. There is no rebound and no guarding.  Mild epigastric tenderness with no guarding or rebound  Musculoskeletal: Normal range of motion.  Neurological: He is alert and oriented to person, place, and time.  Skin: He is not diaphoretic.  Psychiatric: He has a normal mood and affect.  Nursing note and vitals reviewed.   ED Course  Procedures (including critical care time) Labs Review Labs Reviewed - No data to display  Imaging Review No results found. I have personally reviewed and evaluated these images and lab results as part of my medical decision-making.   EKG Interpretation None      MDM   Final diagnoses:  Non-intractable vomiting with nausea, vomiting of unspecified type   Filed Vitals:   11/05/15 0856  BP: 132/78  Pulse: 68  Temp: 97.9 F (36.6 C)  TempSrc: Oral  Resp: 18  SpO2: 97%    Medications  sodium chloride 0.9 % bolus 2,000 mL (not administered)  ondansetron (ZOFRAN) injection 4 mg (not administered)  famotidine (PEPCID) IVPB 20 mg premix (not administered)    Christopher Estes is 30 y.o. male presenting with Multiple episodes of emesis onset 3 days ago with associated bright red blood streaking the emesis starting yesterday. Patient overall very well appearing, abdominal exam is benign. Patient used to drink heavily, several beers per day daily but he quit 2 weeks ago. Afebrile with stable vital signs. Will check basic blood work, rehydrate and give Zofran and Pepcid. Patient states he's had reduced urinary output over the last several days, likely mild dehydration.  CBC with no anemia, mild leukocytosis of 10.1.  Informed by nurse that patient became agitated and hostile, he eloped. Patient had stable vital signs, nonsurgical abdominal exam, blood work is resulted with no significant anemia or electrolyte abnormality. I think it this  patient is stable to leave the department, he is alert and oriented 3, is not clinically intoxicated and has capacity for medical decision making.   Wynetta Emery, PA-C 11/05/15 1036  Vanetta Mulders, MD 11/06/15 [phone]

## 2015-11-05 NOTE — ED Notes (Signed)
Went in to hourly round on pt. Pt began yelling at this tech that no one has given him fluids yet and that I could take his urine and he was checking himself out. This tech tried to explain that the nurse was coming to start his IV and she would be here in just a minute. He then began yelling at tech it was all because it was because he didn't have insurance. This tech went to get [employer] RN and he began yelling at her as well and he walked out.

## 2015-11-05 NOTE — ED Notes (Signed)
Pt sts vomiting x 3 days with some blood last night; pt sts hx of same in past multiple times; pt sts abd pain

## 2015-11-05 NOTE — ED Notes (Signed)
This NS confronted patient to calm the situation. Patient stated that he was going to leave because this happens all the time. I asked him what happens all the time?, to be clear. He said that he has to wait without seeing anyone because he doesn't have insurance. I told him that is not what we go on and that everyone is equal. He stated that he has seen the doctor, and that phlebotomy drew his blood approx 15 mins ago. I told him we have to wait on the results to come back before the RN and Dr/Pa can continue any further. He also stated that he comes here every month and it is always the same, only labs and urine sample and then he goes home. Patient continued to be loud without letting us talk. Shanda BumpsJessica, RN started to tell him that she was going to come in and start an IV, but patient stormed out of the room and stated that he was going to leave and walked down the [address].

## 2015-12-19 ENCOUNTER — Encounter (HOSPITAL_COMMUNITY): Payer: Self-pay | Admitting: Emergency Medicine

## 2015-12-19 ENCOUNTER — Emergency Department (HOSPITAL_COMMUNITY): Payer: Self-pay

## 2015-12-19 ENCOUNTER — Emergency Department (HOSPITAL_COMMUNITY)
Admission: EM | Admit: 2015-12-19 | Discharge: 2015-12-19 | Disposition: A | Discharge status: Home / Self Care | Payer: Self-pay | Attending: Emergency Medicine | Admitting: Emergency Medicine

## 2015-12-19 DIAGNOSIS — X509XXA Other and unspecified overexertion or strenuous movements or postures, initial encounter: Secondary | ICD-10-CM | POA: Insufficient documentation

## 2015-12-19 DIAGNOSIS — M25512 Pain in left shoulder: Secondary | ICD-10-CM | POA: Insufficient documentation

## 2015-12-19 DIAGNOSIS — Y9353 Activity, golf: Secondary | ICD-10-CM | POA: Insufficient documentation

## 2015-12-19 DIAGNOSIS — Y9239 Other specified sports and athletic area as the place of occurrence of the external cause: Secondary | ICD-10-CM | POA: Insufficient documentation

## 2015-12-19 DIAGNOSIS — Z79899 Other long term (current) drug therapy: Secondary | ICD-10-CM | POA: Insufficient documentation

## 2015-12-19 DIAGNOSIS — Y999 Unspecified external cause status: Secondary | ICD-10-CM | POA: Insufficient documentation

## 2015-12-19 DIAGNOSIS — F17210 Nicotine dependence, cigarettes, uncomplicated: Secondary | ICD-10-CM | POA: Insufficient documentation

## 2015-12-19 NOTE — Discharge Instructions (Signed)
There does not appear to be an emergent cause for your symptoms at this time. Your x-rays were negative for any broken bones or dislocations. Please wear sling while active and continue using Motrin and Tylenol for discomfort. Follow-up with your doctor for reevaluation next week. You may also follow-up with orthopedics if your symptoms persist.  Joint Pain Joint pain, which is also called arthralgia, can be caused by many things. Joint pain often goes away when you follow your health care provider's instructions for relieving pain at home. However, joint pain can also be caused by conditions that require further treatment. Common causes of joint pain include:  Bruising in the area of the joint.  Overuse of the joint.  Wear and tear on the joints that occur with aging (osteoarthritis).  Various other forms of arthritis.  A buildup of a crystal form of uric acid in the joint (gout).  Infections of the joint (septic arthritis) or of the bone (osteomyelitis). Your health care provider may recommend medicine to help with the pain. If your joint pain continues, additional tests may be needed to diagnose your condition. HOME CARE INSTRUCTIONS Watch your condition for any changes. Follow these instructions as directed to lessen the pain that you are feeling.  Take medicines only as directed by your health care provider.  Rest the affected area for as long as your health care provider says that you should. If directed to do so, raise the painful joint above the level of your heart while you are sitting or lying down.  Do not do things that cause or worsen pain.  If directed, apply ice to the painful area:  Put ice in a plastic bag.  Place a towel between your skin and the bag.  Leave the ice on for 20 minutes, 2-3 times per day.  Wear an elastic bandage, splint, or sling as directed by your health care provider. Loosen the elastic bandage or splint if your fingers or toes become numb and  tingle, or if they turn cold and blue.  Begin exercising or stretching the affected area as directed by your health care provider. Ask your health care provider what types of exercise are safe for you.  Keep all follow-up visits as directed by your health care provider. This is important. SEEK MEDICAL CARE IF:  Your pain increases, and medicine does not help.  Your joint pain does not improve within 3 days.  You have increased bruising or swelling.  You have a fever.  You lose 10 lb (4.5 kg) or more without trying. SEEK IMMEDIATE MEDICAL CARE IF:  You are not able to move the joint.  Your fingers or toes become numb or they turn cold and blue.   This information is not intended to replace advice given to you by your health care provider. Make sure you discuss any questions you have with your health care provider.   Document Released: 07/17/2005 Document Revised: 08/07/2014 Document Reviewed: 04/28/2014 Elsevier Interactive Patient Education [employer]2016 [employer]vier Inc.

## 2015-12-19 NOTE — ED Notes (Signed)
Pt reports old injury left shoulder, sts was playing Golf yesterday when he hurt the shoulder again. Pt unable to move shoulder without pain.

## 2015-12-19 NOTE — ED Provider Notes (Signed)
CSN: [id number]     Arrival date & time 12/19/15  0825 History   First MD Initiated Contact with Patient 12/19/15 0900     Chief Complaint  Patient presents with  . Shoulder Pain    left     (Consider location/radiation/quality/duration/timing/severity/associated sxs/prior Treatment) HPI Christopher Estes is a 30 y.o. male here for evaluation of left-sided shoulder pain. Patient reports he injured his left shoulder while playing golf yesterday. He reports sudden onset anterior shoulder pain. He reports completing the round and subsequently feeling tingling in his fingers. He has not tried anything to improve his symptoms. Certain movement worsens his discomfort. No neck pain, chest pain. No other alleviating or aggravating factors.  Past Medical History  Diagnosis Date  . Migraine   . Insomnia   . Hyperlipidemia    Past Surgical History  Procedure Laterality Date  . Tonsillectomy    . Inner ear surgery     Family History  Problem Relation Age of Onset  . Diabetes Mother   . Hypertension Mother   . Asthma Mother   . Cancer Father   . Stroke Father   . Thyroid disease Father    Social History  Substance Use Topics  . Smoking status: Current Every Day Smoker    Types: Cigarettes  . Smokeless tobacco: None  . Alcohol Use: 14.4 oz/week    24 Cans of beer per week     Comment: weekly    Review of Systems A 10 point review of systems was completed and was negative except for pertinent positives and negatives as mentioned in the history of present illness     Allergies  Review of patient's allergies indicates no known allergies.  Home Medications   Prior to Admission medications   Medication Sig Start Date End Date Taking? Authorizing Provider  PAPAYA PO Take 4 tablets by mouth daily. Chewable   Yes Historical Provider, MD  amoxicillin-clavulanate (AUGMENTIN) 875-125 MG tablet Take 1 tablet by mouth 2 (two) times daily. Take with food Patient not taking: Reported on  07/21/2015 05/16/15   Elson AreasLeslie K Sofia, PA-C  dicyclomine (BENTYL) 20 MG tablet Take 1 tablet (20 mg total) by mouth 2 (two) times daily. Patient not taking: Reported on 09/22/2015 07/21/15   Anselm PancoastShawn C Joy, PA-C  fluticasone [facility](FLONASE) 50 MCG/ACT nasal spray Place two sprays in each nostril once daily Patient not taking: Reported on 11/30/2014 06/23/14   Lattie HawStephen A Beese, MD  ibuprofen (ADVIL,MOTRIN) 800 MG tablet Take 1 tablet (800 mg total) by mouth 3 (three) times daily. Patient not taking: Reported on 09/22/2015 05/16/15   Elson AreasLeslie K Sofia, PA-C  metoCLOPramide (REGLAN) 10 MG tablet Take 1 tablet (10 mg total) by mouth every 6 (six) hours. Patient not taking: Reported on 09/22/2015 12/24/14   Janne NapoleonHope M Neese, NP  ondansetron (ZOFRAN ODT) 4 MG disintegrating tablet Take 1 tablet (4 mg total) by mouth every 8 (eight) hours as needed for nausea. Patient not taking: Reported on 11/05/2015 09/22/15   Eber HongBrian Miller, MD  promethazine (PHENERGAN) 25 MG tablet Take 1 tablet (25 mg total) by mouth every 6 (six) hours as needed for nausea or vomiting. Patient not taking: Reported on 11/05/2015 09/22/15   Eber HongBrian Miller, MD  ranitidine (ZANTAC) 150 MG tablet Take 1 tablet (150 mg total) by mouth 2 (two) times daily. Patient not taking: Reported on 09/22/2015 12/24/14   Janne NapoleonHope M Neese, NP   BP 103/90 mmHg  Pulse 78  Temp(Src) 98.3 F (36.8 C) (Oral)  Resp 18  SpO2 97% Physical Exam  Constitutional: He is oriented to person, place, and time. He appears well-developed and well-nourished.  HENT:  Head: Normocephalic and atraumatic.  Mouth/Throat: Oropharynx is clear and moist.  Eyes: Conjunctivae are normal. Pupils are equal, round, and reactive to light. Right eye exhibits no discharge. Left eye exhibits no discharge. No scleral icterus.  Neck: Neck supple.  Cardiovascular: Normal rate, regular rhythm, normal heart sounds and intact distal pulses.   Pulmonary/Chest: Effort normal and breath sounds normal. No respiratory  distress. He has no wheezes. He has no rales.  Abdominal: Soft. There is no tenderness.  Musculoskeletal: He exhibits tenderness.  Tenderness over left anterior deltoid. Range of motion decreased secondary only to pain. No other focal bony tenderness. Grip strength intact. Sensation intact to light touch. No elbow pain. No cervical spine pain or tenderness.  Neurological: He is alert and oriented to person, place, and time.  Cranial Nerves II-XII grossly intact  Skin: Skin is warm and dry. No rash noted.  Psychiatric: He has a normal mood and affect.  Nursing note and vitals reviewed.   ED Course  Procedures (including critical care time) Labs Review Labs Reviewed - No data to display  Imaging Review Dg Shoulder Left  12/19/2015  CLINICAL DATA:  Left shoulder pain for 2 days. History of prior biceps tendon tear. EXAM: LEFT SHOULDER - 2+ VIEW COMPARISON:  09/28/2012 FINDINGS: No fracture or dislocation. Glenohumeral and acromioclavicular joint spaces appear preserved. No evidence of calcific tendinitis. Regional soft tissues appear normal. Limited visualization of the adjacent thorax is normal. IMPRESSION: Normal radiographs of the left shoulder. Electronically Signed   By: Simonne Come M.D.   On: 12/19/2015 09:46   I have personally reviewed and evaluated these images and lab results as part of my medical decision-making.   EKG Interpretation None      MDM  Patient X-Ray negative for obvious fracture or dislocation. Discussed Motrin for pain management. Pt advised to follow up with orthopedics if symptoms persist. Symptoms consistent with a tendinitis versus other musculoskeletal injury. Neurovascularly intact. Patient given arm sling while in ED, conservative therapy recommended and discussed. Patient will be dc home & is agreeable with above plan.  Final diagnoses:  Left shoulder pain        Joycie Peek, PA-C 12/19/15 1342  Leta Baptist, MD 12/26/15 [phone]

## 2016-03-21 ENCOUNTER — Emergency Department (HOSPITAL_COMMUNITY)
Admission: EM | Admit: 2016-03-21 | Discharge: 2016-03-21 | Disposition: A | Discharge status: Home / Self Care | Payer: Self-pay | Attending: Emergency Medicine | Admitting: Emergency Medicine

## 2016-03-21 ENCOUNTER — Encounter (HOSPITAL_COMMUNITY): Payer: Self-pay | Admitting: Emergency Medicine

## 2016-03-21 DIAGNOSIS — R1110 Vomiting, unspecified: Secondary | ICD-10-CM | POA: Insufficient documentation

## 2016-03-21 DIAGNOSIS — Z5321 Procedure and treatment not carried out due to patient leaving prior to being seen by health care provider: Secondary | ICD-10-CM | POA: Insufficient documentation

## 2016-03-21 DIAGNOSIS — Z79899 Other long term (current) drug therapy: Secondary | ICD-10-CM | POA: Insufficient documentation

## 2016-03-21 DIAGNOSIS — F17210 Nicotine dependence, cigarettes, uncomplicated: Secondary | ICD-10-CM | POA: Insufficient documentation

## 2016-03-21 LAB — COMPREHENSIVE METABOLIC PANEL
ALT: 29 U/L (ref 17–63)
ANION GAP: 13 (ref 5–15)
AST: 29 U/L (ref 15–41)
Albumin: 4.9 g/dL (ref 3.5–5.0)
Alkaline Phosphatase: 59 U/L (ref 38–126)
BILIRUBIN TOTAL: 1.7 mg/dL — AB (ref 0.3–1.2)
BUN: 10 mg/dL (ref 6–20)
CO2: 20 mmol/L — ABNORMAL LOW (ref 22–32)
Calcium: 9.9 mg/dL (ref 8.9–10.3)
Chloride: 104 mmol/L (ref 101–111)
Creatinine, Ser: 0.93 mg/dL (ref 0.61–1.24)
GFR calc Af Amer: >60 mL/min (ref >60)
Glucose, Bld: 118 mg/dL — ABNORMAL HIGH (ref 65–99)
POTASSIUM: 3.9 mmol/L (ref 3.5–5.1)
Sodium: 137 mmol/L (ref 135–145)
TOTAL PROTEIN: 7.8 g/dL (ref 6.5–8.1)

## 2016-03-21 LAB — CBC
HEMATOCRIT: 50.1 % (ref 39.0–52.0)
HEMOGLOBIN: 17.6 g/dL — AB (ref 13.0–17.0)
MCH: 32.5 pg (ref 26.0–34.0)
MCHC: 35.1 g/dL (ref 30.0–36.0)
MCV: 92.4 fL (ref 78.0–100.0)
PLATELETS: 384 10*3/uL (ref 150–400)
RBC: 5.42 MIL/uL (ref 4.22–5.81)
RDW: 13.1 % (ref 11.5–15.5)
WBC: 15.1 10*3/uL — AB (ref 4.0–10.5)

## 2016-03-21 LAB — LIPASE, BLOOD: Lipase: 18 U/L (ref 11–51)

## 2016-03-21 MED ORDER — ONDANSETRON 4 MG PO TBDP
ORAL_TABLET | ORAL | Status: AC
  Filled 2016-03-21: qty 1

## 2016-03-21 MED ORDER — ONDANSETRON 4 MG PO TBDP
4.0000 mg | ORAL_TABLET | Freq: Once | ORAL | Status: AC | PRN
  Administered 2016-03-21: 4 mg via ORAL

## 2016-03-21 NOTE — ED Notes (Signed)
Pt left without being seen by doctor.  

## 2016-04-18 ENCOUNTER — Encounter (HOSPITAL_COMMUNITY): Payer: Self-pay | Admitting: Emergency Medicine

## 2016-04-18 ENCOUNTER — Emergency Department (HOSPITAL_COMMUNITY)
Admission: EM | Admit: 2016-04-18 | Discharge: 2016-04-18 | Disposition: A | Discharge status: Home / Self Care | Payer: Self-pay | Attending: Emergency Medicine | Admitting: Emergency Medicine

## 2016-04-18 DIAGNOSIS — Z79899 Other long term (current) drug therapy: Secondary | ICD-10-CM | POA: Insufficient documentation

## 2016-04-18 DIAGNOSIS — F17210 Nicotine dependence, cigarettes, uncomplicated: Secondary | ICD-10-CM | POA: Insufficient documentation

## 2016-04-18 DIAGNOSIS — R112 Nausea with vomiting, unspecified: Secondary | ICD-10-CM | POA: Insufficient documentation

## 2016-04-18 DIAGNOSIS — Z792 Long term (current) use of antibiotics: Secondary | ICD-10-CM | POA: Insufficient documentation

## 2016-04-18 DIAGNOSIS — F1290 Cannabis use, unspecified, uncomplicated: Secondary | ICD-10-CM | POA: Insufficient documentation

## 2016-04-18 LAB — COMPREHENSIVE METABOLIC PANEL
ALBUMIN: 4.4 g/dL (ref 3.5–5.0)
ALK PHOS: 51 U/L (ref 38–126)
ALT: 29 U/L (ref 17–63)
AST: 33 U/L (ref 15–41)
Anion gap: 11 (ref 5–15)
BILIRUBIN TOTAL: 1.4 mg/dL — AB (ref 0.3–1.2)
BUN: 15 mg/dL (ref 6–20)
CALCIUM: 9.8 mg/dL (ref 8.9–10.3)
CO2: 25 mmol/L (ref 22–32)
CREATININE: 0.91 mg/dL (ref 0.61–1.24)
Chloride: 102 mmol/L (ref 101–111)
GFR calc Af Amer: >60 mL/min (ref >60)
GLUCOSE: 113 mg/dL — AB (ref 65–99)
POTASSIUM: 3.4 mmol/L — AB (ref 3.5–5.1)
Sodium: 138 mmol/L (ref 135–145)
TOTAL PROTEIN: 8.1 g/dL (ref 6.5–8.1)

## 2016-04-18 LAB — URINALYSIS, ROUTINE W REFLEX MICROSCOPIC
Glucose, UA: NEGATIVE mg/dL
Hgb urine dipstick: NEGATIVE
NITRITE: NEGATIVE
PH: 6 (ref 5.0–8.0)
PROTEIN: 100 mg/dL — AB
Specific Gravity, Urine: 1.036 — ABNORMAL HIGH (ref 1.005–1.030)

## 2016-04-18 LAB — CBC
HEMATOCRIT: 46.4 % (ref 39.0–52.0)
Hemoglobin: 15.9 g/dL (ref 13.0–17.0)
MCH: 31.7 pg (ref 26.0–34.0)
MCHC: 34.3 g/dL (ref 30.0–36.0)
MCV: 92.4 fL (ref 78.0–100.0)
PLATELETS: 462 10*3/uL — AB (ref 150–400)
RBC: 5.02 MIL/uL (ref 4.22–5.81)
RDW: 13.1 % (ref 11.5–15.5)
WBC: 12.5 10*3/uL — ABNORMAL HIGH (ref 4.0–10.5)

## 2016-04-18 LAB — URINE MICROSCOPIC-ADD ON

## 2016-04-18 LAB — LIPASE, BLOOD: Lipase: 18 U/L (ref 11–51)

## 2016-04-18 MED ORDER — SODIUM CHLORIDE 0.9 % IV BOLUS (SEPSIS)
1500.0000 mL | Freq: Once | INTRAVENOUS | Status: AC
  Administered 2016-04-18: 1500 mL via INTRAVENOUS

## 2016-04-18 MED ORDER — ONDANSETRON HCL 4 MG/2ML IJ SOLN
4.0000 mg | Freq: Once | INTRAMUSCULAR | Status: AC
  Administered 2016-04-18: 4 mg via INTRAVENOUS
  Filled 2016-04-18: qty 2

## 2016-04-18 MED ORDER — METOCLOPRAMIDE HCL 10 MG PO TABS: 10.0000 mg | ORAL_TABLET | Freq: Three times a day (TID) | ORAL | 0 refills | Status: DC | PRN

## 2016-04-18 MED ORDER — ONDANSETRON 8 MG PO TBDP: 8.0000 mg | ORAL_TABLET | Freq: Three times a day (TID) | ORAL | 0 refills | Status: DC | PRN

## 2016-04-18 MED ORDER — KETOROLAC TROMETHAMINE 30 MG/ML IJ SOLN
30.0000 mg | Freq: Once | INTRAMUSCULAR | Status: AC
Start: 2016-04-18 — End: 2016-04-18
  Administered 2016-04-18: 30 mg via INTRAVENOUS
  Filled 2016-04-18: qty 1

## 2016-04-18 NOTE — ED Triage Notes (Signed)
Pt c/o nausea, emesis, diarrhea, low medial abdominal pain worse on laying down, shaking. On new medication prozac x 2 weeks. Pt finishing penicillin V for oral abscess. Mild blood tinge to vomit yesterday. Unable to tolerate oral hydration.

## 2016-04-18 NOTE — ED Notes (Signed)
Pt has not been able to use restroom in 2 days

## 2016-04-18 NOTE — ED Provider Notes (Signed)
[facility]-EMERGENCY DEPT Provider Note   CSN: [id number] Arrival date & time: 04/18/16  4098     History   Chief Complaint No chief complaint on file.   HPI Christopher Estes is a 30 y.o. male.  HPI Patient reports a long-standing history of recurrent nausea vomiting and dehydration.  This is been occurring almost monthly for the past year.  He has not been able to follow-up with gastroenterology secondary to financial issues.  He does not have a primary care physician.  He does report that several years ago he was evaluated by gastroenterologist for similar symptoms and had a colonoscopy which demonstrated no evidence of Crohn's disease and no signs of ulcerative colitis.  He denies abdominal pain at this time.  He denies blood in the stool.  Reports no blood in his vomit.  Denies fevers and chills.  Reports decreased oral intake over the past several days.  He is to be a heavy drinker but reports these decreases drinking an effort to better control his nausea vomiting.  Patient's been seen multiple times in the emergency department.  Patient has never been told his had pancreatitis.   Past Medical History:  Diagnosis Date  . Hyperlipidemia   . Insomnia   . Migraine     There are no active problems to display for this patient.   Past Surgical History:  Procedure Laterality Date  . INNER EAR SURGERY    . TONSILLECTOMY         Home Medications    Prior to Admission medications   Medication Sig Start Date End Date Taking? Authorizing Provider  FLUoxetine (PROZAC) 20 MG capsule Take 20 mg by mouth daily.   Yes Historical Provider, MD  PAPAYA PO Take 4 tablets by mouth daily. Chewable   Yes Historical Provider, MD  penicillin v potassium (VEETID) 250 MG tablet Take 500 mg by mouth 4 (four) times daily.   Yes Historical Provider, MD  metoCLOPramide (REGLAN) 10 MG tablet Take 1 tablet (10 mg total) by mouth every 8 (eight) hours as needed for nausea or vomiting. 04/18/16   Azalia Bilis,  MD  ondansetron (ZOFRAN ODT) 8 MG disintegrating tablet Take 1 tablet (8 mg total) by mouth every 8 (eight) hours as needed for nausea or vomiting. 04/18/16   Azalia Bilis, MD    Family History Family History  Problem Relation Age of Onset  . Diabetes Mother   . Hypertension Mother   . Asthma Mother   . Cancer Father   . Stroke Father   . Thyroid disease Father     Social History Social History  Substance Use Topics  . Smoking status: Current Every Day Smoker    Types: Cigarettes  . Smokeless tobacco: Not on file  . Alcohol use 14.4 oz/week    24 Cans of beer per week     Comment: weekly     Allergies   Dilaudid [hydromorphone hcl]   Review of Systems Review of Systems  All other systems reviewed and are negative.    Physical Exam Updated Vital Signs BP 146/81 (BP Location: Left Arm)   Pulse (!) 58   Temp 97.9 F (36.6 C) (Oral)   Resp 15   SpO2 95%   Physical Exam  Constitutional: He is oriented to person, place, and time. He appears well-developed and well-nourished.  HENT:  Head: Normocephalic and atraumatic.  Eyes: EOM are normal.  Neck: Normal range of motion.  Cardiovascular: Normal rate, regular rhythm and normal heart sounds.  Pulmonary/Chest: Effort normal and breath sounds normal. No respiratory distress.  Abdominal: Soft. He exhibits no distension. There is no tenderness.  Musculoskeletal: Normal range of motion.  Neurological: He is alert and oriented to person, place, and time.  Skin: Skin is warm and dry.  Psychiatric: He has a normal mood and affect. Judgment normal.  Nursing note and vitals reviewed.    ED Treatments / Results  Labs (all labs ordered are listed, but only abnormal results are displayed) Labs Reviewed  COMPREHENSIVE METABOLIC PANEL - Abnormal; Notable for the following:       Result Value   Potassium 3.4 (*)    Glucose, Bld 113 (*)    Total Bilirubin 1.4 (*)    All other components within normal limits  CBC -  Abnormal; Notable for the following:    WBC 12.5 (*)    Platelets 462 (*)    All other components within normal limits  LIPASE, BLOOD  URINALYSIS, ROUTINE W REFLEX MICROSCOPIC (NOT AT [facility])    EKG  EKG Interpretation None       Radiology No results found.  Procedures Procedures (including critical care time)  Medications Ordered in ED Medications  sodium chloride 0.9 % bolus 1,500 mL (0 mLs Intravenous Stopped 04/18/16 1240)  ondansetron (ZOFRAN) injection 4 mg (4 mg Intravenous Given 04/18/16 1111)  ketorolac (TORADOL) 30 MG/ML injection 30 mg (30 mg Intravenous Given 04/18/16 1113)     Initial Impression / Assessment and Plan / ED Course  I have reviewed the triage vital signs and the nursing notes.  Pertinent labs & imaging results that were available during my care of the patient were reviewed by me and considered in my medical decision making (see chart for details).  Clinical Course    2:54 PM Overall well-appearing.  Feels much better after IV fluids.  Labs without significant abnormalities.  Discharge home in good condition.  Home with nausea medication.  [facility]  Hopefully after developing relationship with a primary care physician they can determine away to have him follow-up with gastroenterology as he will significantly benefit from this.  Final Clinical Impressions(s) / ED Diagnoses   Final diagnoses:  Nausea and vomiting, vomiting of unspecified type    New Prescriptions New Prescriptions   METOCLOPRAMIDE (REGLAN) 10 MG TABLET    Take 1 tablet (10 mg total) by mouth every 8 (eight) hours as needed for nausea or vomiting.   ONDANSETRON (ZOFRAN ODT) 8 MG DISINTEGRATING TABLET    Take 1 tablet (8 mg total) by mouth every 8 (eight) hours as needed for nausea or vomiting.     Azalia BilisKevin Shantice Menger, MD 04/18/16 1455

## 2016-05-31 ENCOUNTER — Emergency Department (HOSPITAL_COMMUNITY): Payer: Self-pay

## 2016-05-31 ENCOUNTER — Encounter (HOSPITAL_COMMUNITY): Payer: Self-pay

## 2016-05-31 ENCOUNTER — Emergency Department (HOSPITAL_COMMUNITY)
Admission: EM | Admit: 2016-05-31 | Discharge: 2016-05-31 | Discharge status: Against Medical Advice | Payer: Self-pay | Attending: Emergency Medicine | Admitting: Emergency Medicine

## 2016-05-31 DIAGNOSIS — R079 Chest pain, unspecified: Secondary | ICD-10-CM

## 2016-05-31 DIAGNOSIS — F17210 Nicotine dependence, cigarettes, uncomplicated: Secondary | ICD-10-CM | POA: Insufficient documentation

## 2016-05-31 DIAGNOSIS — R071 Chest pain on breathing: Secondary | ICD-10-CM | POA: Insufficient documentation

## 2016-05-31 LAB — HEPATIC FUNCTION PANEL
ALBUMIN: 4 g/dL (ref 3.5–5.0)
ALT: 21 U/L (ref 17–63)
AST: 23 U/L (ref 15–41)
Alkaline Phosphatase: 47 U/L (ref 38–126)
BILIRUBIN TOTAL: 0.7 mg/dL (ref 0.3–1.2)
Bilirubin, Direct: <0.1 mg/dL — ABNORMAL LOW (ref 0.1–0.5)
TOTAL PROTEIN: 7.2 g/dL (ref 6.5–8.1)

## 2016-05-31 LAB — CBC
HEMATOCRIT: 45.6 % (ref 39.0–52.0)
HEMOGLOBIN: 15.7 g/dL (ref 13.0–17.0)
MCH: 31.7 pg (ref 26.0–34.0)
MCHC: 34.4 g/dL (ref 30.0–36.0)
MCV: 91.9 fL (ref 78.0–100.0)
Platelets: 407 10*3/uL — ABNORMAL HIGH (ref 150–400)
RBC: 4.96 MIL/uL (ref 4.22–5.81)
RDW: 13 % (ref 11.5–15.5)
WBC: 14.4 10*3/uL — ABNORMAL HIGH (ref 4.0–10.5)

## 2016-05-31 LAB — BASIC METABOLIC PANEL
ANION GAP: 13 (ref 5–15)
BUN: 6 mg/dL (ref 6–20)
CO2: 23 mmol/L (ref 22–32)
Calcium: 9.4 mg/dL (ref 8.9–10.3)
Chloride: 104 mmol/L (ref 101–111)
Creatinine, Ser: 0.79 mg/dL (ref 0.61–1.24)
GFR calc Af Amer: >60 mL/min (ref >60)
GFR calc non Af Amer: >60 mL/min (ref >60)
GLUCOSE: 94 mg/dL (ref 65–99)
POTASSIUM: 3.7 mmol/L (ref 3.5–5.1)
Sodium: 140 mmol/L (ref 135–145)

## 2016-05-31 LAB — URINALYSIS, ROUTINE W REFLEX MICROSCOPIC
GLUCOSE, UA: NEGATIVE mg/dL
Hgb urine dipstick: NEGATIVE
Ketones, ur: >80 mg/dL — AB
LEUKOCYTES UA: NEGATIVE
NITRITE: NEGATIVE
PH: 7.5 (ref 5.0–8.0)
PROTEIN: NEGATIVE mg/dL
Specific Gravity, Urine: 1.027 (ref 1.005–1.030)

## 2016-05-31 LAB — LIPASE, BLOOD: Lipase: 22 U/L (ref 11–51)

## 2016-05-31 LAB — I-STAT TROPONIN, ED: Troponin i, poc: 0 ng/mL (ref 0.00–0.08)

## 2016-05-31 LAB — D-DIMER, QUANTITATIVE: D-Dimer, Quant: 0.29 ug/mL-FEU (ref 0.00–0.50)

## 2016-05-31 MED ORDER — GI COCKTAIL ~~LOC~~
30.0000 mL | Freq: Once | ORAL | Status: AC
  Administered 2016-05-31: 30 mL via ORAL
  Filled 2016-05-31: qty 30

## 2016-05-31 MED ORDER — SODIUM CHLORIDE 0.9 % IV BOLUS (SEPSIS): 1000.0000 mL | Freq: Once | INTRAVENOUS | Status: DC

## 2016-05-31 NOTE — ED Notes (Signed)
Patient transported to X-ray 

## 2016-05-31 NOTE — ED Notes (Signed)
Tatyana PA at bedside   

## 2016-05-31 NOTE — ED Triage Notes (Signed)
Per [facility]: Pt started having substernal chest pain, radiated to left side around 5 am. Chest pain is worse with deep breathing and with movement. Had 4 episodes of vomiting this morning, had some vomiting last night after eating pepperoni sub from [employer]. Pt has GI issues, takes Reglan, possible chron's disease. Pts grandfather had a heart attack in his 7420's.  Pt received 324 ASA.  4 mg of zofran IV

## 2016-05-31 NOTE — ED Provider Notes (Signed)
[facility]-EMERGENCY DEPT Provider Note   CSN: [id number] Arrival date & time: 05/31/16  [id number]     History   Chief Complaint Chief Complaint  Patient presents with  . Chest Pain  . Emesis    HPI Fahim Chestine SporeClark is a 30 y.o. male.  HPI Amond Chestine SporeClark is a 30 y.o. male presents to emergency department complaining of nausea, vomiting, chest pain. States he has had nausea and vomiting for approximately 24 hours. He states that left-sided chest pain started in the middle of the night and woke him up from sleep. He states he has not vomited in several hours. He denies hematemesis. He states now he is having normal stools. He reports some shortness of breath. He states chest pain is worsened with coughing and deep breathing. He does admit to increased cough in the last day as well. He denies any nasal congestion, body aches, fever, chills, sore throat. He states pain is in the left side of the chest and radiates into the left arm. He is a smoker. He also states he was worked up for possible Crohn's disease last year but lost his insurance and now unable to follow-up. He admits to alcohol drink last night. He was given Zofran  upon arrival and states that his nausea has improved. He denies any recent travel surgeries. No swelling or pain in his legs. Patient states that his grandfather was diagnosed with MI at 29 years old. Also has history of coronary disease in his grandfather and great-grandfather, both of whom passed away from heart attacks.  Past Medical History:  Diagnosis Date  . Hyperlipidemia   . Insomnia   . Migraine     There are no active problems to display for this patient.   Past Surgical History:  Procedure Laterality Date  . INNER EAR SURGERY    . TONSILLECTOMY         Home Medications    Prior to Admission medications   Medication Sig Start Date End Date Taking? Authorizing Provider  FLUoxetine (PROZAC) 20 MG capsule Take 20 mg by mouth daily.    Historical Provider, MD    metoCLOPramide (REGLAN) 10 MG tablet Take 1 tablet (10 mg total) by mouth every 8 (eight) hours as needed for nausea or vomiting. 04/18/16   Azalia BilisKevin Campos, MD  ondansetron (ZOFRAN ODT) 8 MG disintegrating tablet Take 1 tablet (8 mg total) by mouth every 8 (eight) hours as needed for nausea or vomiting. 04/18/16   Azalia BilisKevin Campos, MD  PAPAYA PO Take 4 tablets by mouth daily. Chewable    Historical Provider, MD  penicillin v potassium (VEETID) 250 MG tablet Take 500 mg by mouth 4 (four) times daily.    Historical Provider, MD    Family History Family History  Problem Relation Age of Onset  . Diabetes Mother   . Hypertension Mother   . Asthma Mother   . Cancer Father   . Stroke Father   . Thyroid disease Father     Social History Social History  Substance Use Topics  . Smoking status: Current Every Day Smoker    Packs/day: 1.00    Types: Cigarettes  . Smokeless tobacco: Never Used  . Alcohol use 14.4 oz/week    24 Cans of beer per week     Comment: weekly     Allergies   Dilaudid [hydromorphone hcl]   Review of Systems Review of Systems  Constitutional: Negative for chills and fever.  Respiratory: Positive for cough, chest tightness and shortness  of breath.   Cardiovascular: Positive for chest pain. Negative for palpitations and leg swelling.  Gastrointestinal: Positive for nausea and vomiting. Negative for abdominal distention, abdominal pain and diarrhea.  Genitourinary: Negative for dysuria, frequency, hematuria and urgency.  Musculoskeletal: Negative for arthralgias, myalgias, neck pain and neck stiffness.  Skin: Negative for rash.  Allergic/Immunologic: Negative for immunocompromised state.  Neurological: Negative for dizziness, weakness, light-headedness, numbness and headaches.  All other systems reviewed and are negative.    Physical Exam Updated Vital Signs BP 134/81   Pulse 74   Temp 98.9 F (37.2 C) (Oral)   Resp 14   Ht 6' (1.829 m)   Wt 83.9 kg   SpO2  100%   BMI 25.09 kg/m   Physical Exam  Constitutional: He appears well-developed and well-nourished. No distress.  HENT:  Head: Normocephalic and atraumatic.  Eyes: Conjunctivae are normal.  Neck: Neck supple.  Cardiovascular: Normal rate, regular rhythm and normal heart sounds.   Pulmonary/Chest: Effort normal. No respiratory distress. He has no wheezes. He has no rales. He exhibits no tenderness.  Abdominal: Soft. Bowel sounds are normal. He exhibits no distension. There is no tenderness. There is no rebound.  Musculoskeletal: He exhibits no edema.  Neurological: He is alert.  Skin: Skin is warm and dry.  Nursing note and vitals reviewed.    ED Treatments / Results  Labs (all labs ordered are listed, but only abnormal results are displayed) Labs Reviewed  BASIC METABOLIC PANEL  CBC  URINALYSIS, ROUTINE W REFLEX MICROSCOPIC (NOT AT [facility])  LIPASE, BLOOD  HEPATIC FUNCTION PANEL  D-DIMER, QUANTITATIVE (NOT AT [facility])  Rosezena Sensor, ED    EKG  EKG Interpretation None       Radiology Dg Chest 2 View  Result Date: 05/31/2016 CLINICAL DATA:  LEFT chest pain beginning 4 hours ago, awoke patient, smoker EXAM: CHEST  2 VIEW COMPARISON:  11/30/2014 FINDINGS: Normal heart size, mediastinal contours, and pulmonary vascularity. Lungs clear. No pleural effusion or pneumothorax. Bones unremarkable. IMPRESSION: Normal exam. Electronically Signed   By: Ulyses Southward M.D.   On: 05/31/2016 07:52    Procedures Procedures (including critical care time)  Medications Ordered in ED Medications  gi cocktail (Maalox,Lidocaine,Donnatal) (not administered)     Initial Impression / Assessment and Plan / ED Course  I have reviewed the triage vital signs and the nursing notes.  Pertinent labs & imaging results that were available during my care of the patient were reviewed by me and considered in my medical decision making (see chart for details).  Clinical Course   Patient in  emergency department with nausea, vomiting since yesterday, chest pain no HEMOPTYSIS and mental of the night. Pain is in the left chest. Worsened with deep breathing and coughing. Also reports increased cough. Vital signs are normal. Chest wall and abdominal wall nontender. Patient with risk factors for PE as well as coronary artery disease. Will check labs including troponin, d-dimer, chest x-ray. Will try GI cocktail.  10:00 AM Blood work shows elevated white blood cell count of 14.4 and elevated platelets at 407. Patient appears to be dehydrated. Fluids ordered. Patient with extensive cardiac family history, smoker, initial troponin and d-dimer negative.  Will repeat troponin.   11:39 AM Lab tech went in to do repeat troponin, patient refused. I went in to talk to the patient, patient is wanting to leave. He seems upset, and I asked him why he is so upset, patient stated "you should've recheck this blood work hours  ago." I explained to him again that we were waiting several hours, which I did explain to him earlier, before the second troponin would be drawn. I explained to him why we have to wait before it is drawn. Patient voiced understanding with lab tech still in the room ready to draw the troponin. However he refused repeat blood test and wanted to go home AGAINST MEDICAL ADVICE. I explained to him that I cannot rule out heart attack without the second blood test, and that if he was to have a heart attack he could potentially sustain life threatening condition, and he voiced understanding. Patient still wants to go home. We'll discharge AGAINST MEDICAL ADVICE.  Vitals:   05/31/16 0800 05/31/16 0815 05/31/16 0830 05/31/16 0845  BP: 129/77 120/80 116/79 110/99  Pulse: 75 (!) 53 67 71  Resp: 13 18 16 18   Temp:      TempSrc:      SpO2: 95% 93% 97% 98%  Weight:      Height:        Final Clinical Impressions(s) / ED Diagnoses   Final diagnoses:  Chest pain, unspecified type    New  Prescriptions New Prescriptions   No medications on file     Jaynie Crumbleatyana Lucifer Soja, PA-C 05/31/16 1652    Loren Raceravid Yelverton, MD 06/02/16 [phone]

## 2016-12-30 ENCOUNTER — Encounter (HOSPITAL_COMMUNITY): Payer: Self-pay

## 2016-12-30 ENCOUNTER — Emergency Department (HOSPITAL_COMMUNITY): Payer: Self-pay

## 2016-12-30 ENCOUNTER — Emergency Department (HOSPITAL_COMMUNITY)
Admission: EM | Admit: 2016-12-30 | Discharge: 2016-12-30 | Disposition: A | Discharge status: Home / Self Care | Payer: Self-pay | Attending: Emergency Medicine | Admitting: Emergency Medicine

## 2016-12-30 DIAGNOSIS — S93422A Sprain of deltoid ligament of left ankle, initial encounter: Secondary | ICD-10-CM | POA: Insufficient documentation

## 2016-12-30 DIAGNOSIS — Y999 Unspecified external cause status: Secondary | ICD-10-CM | POA: Insufficient documentation

## 2016-12-30 DIAGNOSIS — Z79899 Other long term (current) drug therapy: Secondary | ICD-10-CM | POA: Insufficient documentation

## 2016-12-30 DIAGNOSIS — W010XXA Fall on same level from slipping, tripping and stumbling without subsequent striking against object, initial encounter: Secondary | ICD-10-CM | POA: Insufficient documentation

## 2016-12-30 DIAGNOSIS — Y929 Unspecified place or not applicable: Secondary | ICD-10-CM | POA: Insufficient documentation

## 2016-12-30 DIAGNOSIS — Y9301 Activity, walking, marching and hiking: Secondary | ICD-10-CM | POA: Insufficient documentation

## 2016-12-30 MED ORDER — HYDROCODONE-ACETAMINOPHEN 5-325 MG PO TABS
2.0000 | ORAL_TABLET | Freq: Once | ORAL | Status: AC
Start: 2016-12-30 — End: 2016-12-30
  Administered 2016-12-30: 2 via ORAL
  Filled 2016-12-30: qty 2

## 2016-12-30 MED ORDER — NAPROXEN 375 MG PO TABS: 375.0000 mg | ORAL_TABLET | Freq: Two times a day (BID) | ORAL | 0 refills | Status: AC | PRN

## 2016-12-30 MED ORDER — HYDROCODONE-ACETAMINOPHEN 5-325 MG PO TABS: 1.0000 | ORAL_TABLET | ORAL | 0 refills | Status: DC | PRN

## 2016-12-30 NOTE — ED Triage Notes (Signed)
Pt rolled left ankle on curb yesterday.  Pt states difficult to walk.

## 2016-12-30 NOTE — ED Provider Notes (Signed)
[facility]-EMERGENCY DEPT Provider Note   CSN: [id number] Arrival date & time: 12/30/16  1010     History   Chief Complaint Chief Complaint  Patient presents with  . Ankle Injury    HPI Christopher Estes is a 31 y.o. male.  HPI 31 year old male with history of left ankle injury who presents with left ankle sprain. The patient was walking last night when he stepped down into a hole. His left ankle inverted. He fell a popping sensations and had immediate onset of pain. He is able to walk for the remainder of the day but overnight, developed worsening pain and swelling. He has had difficulty walking on it today due to this pain. The pain is an aching, throbbing sensation localized to the lateral ankle. No knee pain. Denies any other trauma. No open wounds. No foot pain other than mild tenderness at the base of the foot just inferior to the lateral malleolus.   Past Medical History:  Diagnosis Date  . Hyperlipidemia   . Insomnia   . Migraine     There are no active problems to display for this patient.   Past Surgical History:  Procedure Laterality Date  . INNER EAR SURGERY    . TONSILLECTOMY         Home Medications    Prior to Admission medications   Medication Sig Start Date End Date Taking? Authorizing Provider  HYDROcodone-acetaminophen (NORCO/VICODIN) 5-325 MG tablet Take 1-2 tablets by mouth every 4 (four) hours as needed for moderate pain or severe pain. 12/30/16   Shaune Pollack, MD  metoCLOPramide (REGLAN) 10 MG tablet Take 1 tablet (10 mg total) by mouth every 8 (eight) hours as needed for nausea or vomiting. 04/18/16   Azalia Bilis, MD  naproxen (NAPROSYN) 375 MG tablet Take 1 tablet (375 mg total) by mouth 2 (two) times daily as needed for moderate pain. 12/30/16 01/06/17  Shaune Pollack, MD  ondansetron (ZOFRAN ODT) 8 MG disintegrating tablet Take 1 tablet (8 mg total) by mouth every 8 (eight) hours as needed for nausea or vomiting. Patient not taking: Reported on 05/31/2016  04/18/16   Azalia Bilis, MD  PAPAYA PO Take 4 tablets by mouth daily. Chewable    [provider]    Family History Family History  Problem Relation Age of Onset  . Diabetes Mother   . Hypertension Mother   . Asthma Mother   . Cancer Father   . Stroke Father   . Thyroid disease Father     Social History Social History  Substance Use Topics  . Smoking status: Current Every Day Smoker    Packs/day: 1.00    Types: Cigarettes  . Smokeless tobacco: Never Used  . Alcohol use 14.4 oz/week    24 Cans of beer per week     Comment: weekly     Allergies   Dilaudid [hydromorphone hcl]   Review of Systems Review of Systems  Constitutional: Negative for chills and fever.  Respiratory: Negative for shortness of breath.   Cardiovascular: Negative for chest pain.  Musculoskeletal: Negative for neck pain.  Skin: Negative for rash and wound.  Allergic/Immunologic: Negative for immunocompromised state.  Neurological: Negative for weakness and numbness.  Hematological: Does not bruise/bleed easily.  All other systems reviewed and are negative.    Physical Exam Updated Vital Signs BP 118/70 (BP Location: Left Arm)   Pulse 69   Temp 98.1 F (36.7 C) (Oral)   Resp 16   SpO2 100%   Physical Exam  Constitutional: He appears well-developed and well-nourished.  HENT:  Head: Normocephalic and atraumatic.  Eyes: Conjunctivae are normal.  Neck: Neck supple.  Cardiovascular: Normal rate and regular rhythm.   No murmur heard. Pulmonary/Chest: Effort normal and breath sounds normal. No respiratory distress.  Abdominal: Soft. There is no tenderness.  Musculoskeletal: He exhibits no edema.  Neurological: He is alert.  Skin: Skin is warm and dry.  Psychiatric: He has a normal mood and affect.  Nursing note and vitals reviewed.   LOWER EXTREMITY EXAM: LEFT  INSPECTION & PALPATION: Moderate swelling and tenderness to palpation over lateral malleolus and base of fifth toe.  No open wounds. No bruising. No deformity.  SENSORY: sensation is intact to light touch in:  Superficial peroneal nerve distribution (over dorsum of foot) Deep peroneal nerve distribution (over first dorsal web space) Sural nerve distribution (over lateral aspect 5th metatarsal) Saphenous nerve distribution (over medial instep)  MOTOR:  + Motor EHL (great toe dorsiflexion) + FHL (great toe plantar flexion)  + TA (ankle dorsiflexion)  + GSC (ankle plantar flexion)  VASCULAR: 2+ dorsalis pedis and posterior tibialis pulses Capillary refill < 2 sec, toes warm and well-perfused  COMPARTMENTS: Soft, warm, well-perfused No pain with passive extension No parethesias    ED Treatments / Results  Labs (all labs ordered are listed, but only abnormal results are displayed) Labs Reviewed - No data to display  EKG  EKG Interpretation None       Radiology Dg Ankle Complete Left  Result Date: 12/30/2016 CLINICAL DATA:  Patient with twisting injury to the left ankle. Initial encounter. EXAM: LEFT ANKLE COMPLETE - 3+ VIEW COMPARISON:  None. FINDINGS: There is no evidence of fracture, dislocation, or joint effusion. There is no evidence of arthropathy or other focal bone abnormality. Soft tissues are unremarkable. IMPRESSION: No acute osseous abnormality. Electronically Signed   By: Annia Beltrew  Davis M.D.   On: 12/30/2016 11:03   Dg Foot Complete Left  Result Date: 12/30/2016 CLINICAL DATA:  Acute left foot pain following twisting injury today. Initial encounter. EXAM: LEFT FOOT - COMPLETE 3+ VIEW COMPARISON:  None. FINDINGS: There is no evidence of fracture or dislocation. There is no evidence of arthropathy or other focal bone abnormality. Soft tissues are unremarkable. IMPRESSION: Negative. Electronically Signed   By: Harmon PierJeffrey  Hu M.D.   On: 12/30/2016 11:41    Procedures Procedures (including critical care time)  Medications Ordered in ED Medications  HYDROcodone-acetaminophen  (NORCO/VICODIN) 5-325 MG per tablet 2 tablet (2 tablets Oral Given 12/30/16 1159)     Initial Impression / Assessment and Plan / ED Course  I have reviewed the triage vital signs and the nursing notes.  Pertinent labs & imaging results that were available during my care of the patient were reviewed by me and considered in my medical decision making (see chart for details).     31 year old male with past medical history as above here with left ankle pain and swelling after inversion injury. Suspect low ankle sprain. No evidence of high ankle sprain or Maissoneuve fracture. Plain films are negative. Will place in air soft splint and discharged with outpatient follow-up.  Final Clinical Impressions(s) / ED Diagnoses   Final diagnoses:  Sprain of deltoid ligament of left ankle, initial encounter    New Prescriptions Discharge Medication List as of 12/30/2016 11:52 AM    START taking these medications   Details  HYDROcodone-acetaminophen (NORCO/VICODIN) 5-325 MG tablet Take 1-2 tablets by mouth every 4 (four) hours as needed for  moderate pain or severe pain., Starting Sat 12/30/2016, Print    naproxen (NAPROSYN) 375 MG tablet Take 1 tablet (375 mg total) by mouth 2 (two) times daily as needed for moderate pain., Starting Sat 12/30/2016, Until Sat 01/06/2017, Print         Shaune Pollack, MD 12/30/16 [phone]

## 2017-01-01 ENCOUNTER — Emergency Department (HOSPITAL_COMMUNITY)
Admission: EM | Admit: 2017-01-01 | Discharge: 2017-01-01 | Disposition: A | Discharge status: Home / Self Care | Payer: Self-pay | Attending: Emergency Medicine | Admitting: Emergency Medicine

## 2017-01-01 ENCOUNTER — Encounter (HOSPITAL_COMMUNITY): Payer: Self-pay | Admitting: Emergency Medicine

## 2017-01-01 DIAGNOSIS — Z5321 Procedure and treatment not carried out due to patient leaving prior to being seen by health care provider: Secondary | ICD-10-CM | POA: Insufficient documentation

## 2017-01-01 DIAGNOSIS — R110 Nausea: Secondary | ICD-10-CM | POA: Insufficient documentation

## 2017-01-01 NOTE — ED Provider Notes (Signed)
Pt left without being seen after triage; not in room when I went to assess him. Therefore, he was never seen by me or any other provider after nursing assessment.    [address], [place], [place] 01/01/17 1106    Cathren LaineSteinl, Kevin, MD 01/01/17 [phone]

## 2017-01-01 NOTE — ED Notes (Addendum)
Pt unable to be found.  It is assumed he left.

## 2017-01-01 NOTE — ED Notes (Signed)
Pt not in room when Provider went in for assessment.  Will continue to look for Pt.

## 2017-01-01 NOTE — ED Notes (Addendum)
Pt stated that he did not feel like talking and will not take temp. Pt would not cooperate with EMS when signing disclosure form.

## 2017-01-01 NOTE — ED Notes (Signed)
Bed: WTR8 Expected date:  Expected time:  Means of arrival:  Comments: 

## 2017-01-01 NOTE — ED Triage Notes (Signed)
Per EMS-states he is taking pain meds for an ankle injury-took meds on an empty stomach and became nauseas and started dry heaving

## 2017-03-29 ENCOUNTER — Encounter (HOSPITAL_COMMUNITY): Payer: Self-pay | Admitting: Emergency Medicine

## 2017-03-29 ENCOUNTER — Emergency Department (HOSPITAL_COMMUNITY)
Admission: EM | Admit: 2017-03-29 | Discharge: 2017-03-29 | Disposition: A | Discharge status: Home / Self Care | Payer: Self-pay | Attending: Emergency Medicine | Admitting: Emergency Medicine

## 2017-03-29 DIAGNOSIS — F17210 Nicotine dependence, cigarettes, uncomplicated: Secondary | ICD-10-CM | POA: Insufficient documentation

## 2017-03-29 DIAGNOSIS — Z79899 Other long term (current) drug therapy: Secondary | ICD-10-CM | POA: Insufficient documentation

## 2017-03-29 DIAGNOSIS — R6889 Other general symptoms and signs: Secondary | ICD-10-CM

## 2017-03-29 DIAGNOSIS — J111 Influenza due to unidentified influenza virus with other respiratory manifestations: Secondary | ICD-10-CM | POA: Insufficient documentation

## 2017-03-29 MED ORDER — BENZONATATE 100 MG PO CAPS: 100.0000 mg | ORAL_CAPSULE | Freq: Three times a day (TID) | ORAL | 0 refills | Status: DC

## 2017-03-29 MED ORDER — ALBUTEROL SULFATE HFA 108 (90 BASE) MCG/ACT IN AERS
2.0000 | INHALATION_SPRAY | RESPIRATORY_TRACT | Status: DC | PRN
  Administered 2017-03-29: 2 via RESPIRATORY_TRACT
  Filled 2017-03-29: qty 6.7

## 2017-03-29 NOTE — ED Notes (Signed)
Asked Pt what he was here for he stated "I have been feeling sick for 2 days, maybe something like the flu, and I really just need a work note." He also stated that he had a fever of 101.5 this morning and took NSAIDS. When Pt was asking if he was feeling short of breath he stated "No I just feel congested."

## 2017-03-29 NOTE — Discharge Instructions (Signed)
Return if your symptoms worsen

## 2017-03-29 NOTE — ED Provider Notes (Signed)
[facility] Provider Note   CSN: [id number] Arrival date & time: 03/29/17  1527     History   Chief Complaint Chief Complaint  Patient presents with  . Generalized Body Aches  . Nasal Congestion  . Cough    HPI Christopher Estes is a 31 y.o. male who presents to the ED with flu like symptoms for 2 days. He reports fever up to 101.5 this morning. He took NSAIDS. He reports congestion and cough. The patient states he is just here for a work note because he has started a new job and does not want to lose it. Patient states he does not want antibiotics and he does not want a chest x-ray he will treat his symptoms and return if needed.  Patient is an every day smoker.  The history is provided by the patient. No language interpreter was used.  Cough  This is a new problem. The current episode started 2 days ago. The cough is productive of sputum. The maximum temperature recorded prior to his arrival was 101 to 101.9 F. Associated symptoms include chills, sweats, headaches, rhinorrhea, sore throat, myalgias, shortness of breath and wheezing. Pertinent negatives include no weight loss, no ear congestion, no ear pain and no eye redness. He is a smoker.    Past Medical History:  Diagnosis Date  . Hyperlipidemia   . Insomnia   . Migraine     There are no active problems to display for this patient.   Past Surgical History:  Procedure Laterality Date  . INNER EAR SURGERY    . TONSILLECTOMY         Home Medications    Prior to Admission medications   Medication Sig Start Date End Date Taking? Authorizing Provider  benzonatate (TESSALON) 100 MG capsule Take 1 capsule (100 mg total) by mouth every 8 (eight) hours. 03/29/17   Janne Napoleon, NP  HYDROcodone-acetaminophen (NORCO/VICODIN) 5-325 MG tablet Take 1-2 tablets by mouth every 4 (four) hours as needed for moderate pain or severe pain. 12/30/16   Shaune Pollack, MD  metoCLOPramide (REGLAN) 10 MG tablet Take 1 tablet (10 mg  total) by mouth every 8 (eight) hours as needed for nausea or vomiting. 04/18/16   Azalia Bilis, MD  ondansetron (ZOFRAN ODT) 8 MG disintegrating tablet Take 1 tablet (8 mg total) by mouth every 8 (eight) hours as needed for nausea or vomiting. Patient not taking: Reported on 05/31/2016 04/18/16   Azalia Bilis, MD  PAPAYA PO Take 4 tablets by mouth daily. Chewable    [provider]    Family History Family History  Problem Relation Age of Onset  . Diabetes Mother   . Hypertension Mother   . Asthma Mother   . Cancer Father   . Stroke Father   . Thyroid disease Father     Social History Social History  Substance Use Topics  . Smoking status: Current Every Day Smoker    Packs/day: 1.00    Types: Cigarettes  . Smokeless tobacco: Never Used  . Alcohol use 14.4 oz/week    24 Cans of beer per week     Comment: weekly     Allergies   Dilaudid [hydromorphone hcl]   Review of Systems Review of Systems  Constitutional: Positive for chills and fever. Negative for diaphoresis and weight loss.  HENT: Positive for congestion, rhinorrhea and sore throat. Negative for ear pain, trouble swallowing and voice change.   Eyes: Negative for pain, discharge, redness, itching and visual disturbance.  Respiratory: Positive for cough, shortness of breath and wheezing.   Cardiovascular: Negative for palpitations and leg swelling.  Gastrointestinal: Negative for abdominal pain, nausea and vomiting.  Genitourinary: Positive for decreased urine volume. Negative for dysuria and frequency.  Musculoskeletal: Positive for myalgias. Negative for neck pain and neck stiffness.  Skin: Negative for rash and wound.  Neurological: Positive for headaches. Negative for dizziness and syncope.  Hematological: Negative for adenopathy.  Psychiatric/Behavioral: Negative for confusion. The patient is not nervous/anxious.      Physical Exam Updated Vital Signs BP 138/81 (BP Location: Left Arm)   Pulse 81    Temp 98 F (36.7 C) (Oral)   Resp 18   Ht 5\' 11"  (1.803 m)   Wt 83.9 kg (185 lb)   SpO2 97%   BMI 25.80 kg/m   Physical Exam  Constitutional: He is oriented to person, place, and time. He appears well-developed and well-nourished. No distress.  HENT:  Head: Normocephalic and atraumatic.  Right Ear: Tympanic membrane normal.  Left Ear: Tympanic membrane normal.  Nose: Rhinorrhea present.  Mouth/Throat: Uvula is midline and mucous membranes are normal. No posterior oropharyngeal edema or posterior oropharyngeal erythema.  Eyes: Pupils are equal, round, and reactive to light. Conjunctivae and EOM are normal.  Neck: Normal range of motion. Neck supple.  No meningeal signs.  Cardiovascular: Normal rate and regular rhythm.   Pulmonary/Chest: Effort normal. Wheezes: occasional.  Abdominal: Soft. Bowel sounds are normal. There is no tenderness.  Musculoskeletal: Normal range of motion.  Lymphadenopathy:    He has no cervical adenopathy.  Neurological: He is alert and oriented to person, place, and time.  Skin: Skin is warm and dry.  Psychiatric: He has a normal mood and affect. His behavior is normal.  Nursing note and vitals reviewed.    ED Treatments / Results  Labs (all labs ordered are listed, but only abnormal results are displayed) Labs Reviewed - No data to display  Radiology No results found.  Procedures Procedures (including critical care time)  Medications Ordered in ED Medications  albuterol (PROVENTIL HFA;VENTOLIN HFA) 108 (90 Base) MCG/ACT inhaler 2 puff (not administered)     Initial Impression / Assessment and Plan / ED Course  I have reviewed the triage vital signs and the nursing notes. Patient declined cxr.  Will treat with Tessalon and Albuterol Inhaler.    Patients symptoms are consistent with URI, likely viral etiology. Discussed that antibiotics are not indicated for viral infections. However, since he is an every day smoker it may increase his  chance of infection. Pt will be discharged with symptomatic treatment.  Verbalizes understanding and is agreeable with plan. Pt is hemodynamically stable & in NAD prior to dc. Return precautions discussed.   Final Clinical Impressions(s) / ED Diagnoses   Final diagnoses:  Flu-like symptoms    New Prescriptions New Prescriptions   BENZONATATE (TESSALON) 100 MG CAPSULE    Take 1 capsule (100 mg total) by mouth every 8 (eight) hours.     Kerrie Buffaloeese, Hope GlacierM, [place]NP 03/29/17 Julio [id number]    Shaune PollackIsaacs, [place], MD 03/30/17 [phone]

## 2017-03-29 NOTE — ED Triage Notes (Addendum)
Patient c/o frontal sinus headache, body aches, nasal congestion, productive cough with yellow phlegm and sweats x 2 days. Patient reports that he doesn't have PCP and needs doctor note when out of work.

## 2017-04-01 ENCOUNTER — Emergency Department (HOSPITAL_COMMUNITY): Payer: Self-pay

## 2017-04-01 ENCOUNTER — Encounter (HOSPITAL_COMMUNITY): Payer: Self-pay | Admitting: *Deleted

## 2017-04-01 ENCOUNTER — Emergency Department (HOSPITAL_COMMUNITY)
Admission: EM | Admit: 2017-04-01 | Discharge: 2017-04-01 | Disposition: A | Discharge status: Home / Self Care | Payer: Self-pay | Attending: Emergency Medicine | Admitting: Emergency Medicine

## 2017-04-01 DIAGNOSIS — F17210 Nicotine dependence, cigarettes, uncomplicated: Secondary | ICD-10-CM | POA: Insufficient documentation

## 2017-04-01 DIAGNOSIS — J209 Acute bronchitis, unspecified: Secondary | ICD-10-CM | POA: Insufficient documentation

## 2017-04-01 NOTE — Discharge Instructions (Signed)
Use your albuterol inhaler 2 puffs every 4 hours as needed for cough or shortness of breath. Call any of the numbers on this chart to get a primary care physician. Ask your new prior care physician to help you to stop smoking. Avoid marijuana as the smoke also contributes to lung problems

## 2017-04-01 NOTE — ED Triage Notes (Signed)
Patient is alert and oriented x4.  He is being seen for shortness of breath with a cough.  Patient states that he was seen and Dx with a URI and was not given antibiotics.  Patient was instructed to return if symptoms got worse.  Currently he rates his pain 6 of 10.

## 2017-04-01 NOTE — ED Notes (Signed)
MD at bedside. 

## 2017-04-01 NOTE — ED Provider Notes (Signed)
[facility] Provider Note   CSN: [id number] Arrival date & time: 04/01/17  4098     History   Chief Complaint Chief Complaint  Patient presents with  . Shortness of Breath    HPI Christopher Estes is a 31 y.o. male.Patient complains of cough productive of yellow sputum for 5 days accompanied by shortness of breath worse with exertion improved with rest. He denies any fever. He admits to mild rhinorrhea. No nausea or vomiting. No other associated symptoms He was seen here. 03/29/17 was diagnosed with URI. Prescribed an albuterol inhaler which she's taken without relief.  HPI  Past Medical History:  Diagnosis Date  . Hyperlipidemia   . Insomnia   . Migraine     There are no active problems to display for this patient.   Past Surgical History:  Procedure Laterality Date  . INNER EAR SURGERY    . TONSILLECTOMY         Home Medications    Prior to Admission medications   Medication Sig Start Date End Date Taking? Authorizing Provider  benzonatate (TESSALON) 100 MG capsule Take 1 capsule (100 mg total) by mouth every 8 (eight) hours. 03/29/17   Janne Napoleon, NP  HYDROcodone-acetaminophen (NORCO/VICODIN) 5-325 MG tablet Take 1-2 tablets by mouth every 4 (four) hours as needed for moderate pain or severe pain. 12/30/16   Shaune Pollack, MD  metoCLOPramide (REGLAN) 10 MG tablet Take 1 tablet (10 mg total) by mouth every 8 (eight) hours as needed for nausea or vomiting. 04/18/16   Azalia Bilis, MD  ondansetron (ZOFRAN ODT) 8 MG disintegrating tablet Take 1 tablet (8 mg total) by mouth every 8 (eight) hours as needed for nausea or vomiting. Patient not taking: Reported on 05/31/2016 04/18/16   Azalia Bilis, MD  PAPAYA PO Take 4 tablets by mouth daily. Chewable    [provider]    Family History Family History  Problem Relation Age of Onset  . Diabetes Mother   . Hypertension Mother   . Asthma Mother   . Cancer Father   . Stroke Father   . Thyroid disease  Father     Social History Social History  Substance Use Topics  . Smoking status: Current Every Day Smoker    Packs/day: 1.00    Types: Cigarettes  . Smokeless tobacco: Never Used  . Alcohol use 14.4 oz/week    24 Cans of beer per week     Comment: weekly   Positive marijuana use  Allergies   Dilaudid [hydromorphone hcl]   Review of Systems Review of Systems  Constitutional: Negative.   HENT: Positive for rhinorrhea.   Respiratory: Positive for cough and shortness of breath.   Cardiovascular: Negative.   Gastrointestinal: Negative.   Musculoskeletal: Negative.   Skin: Negative.   Neurological: Negative.   Psychiatric/Behavioral: Negative.   All other systems reviewed and are negative.    Physical Exam Updated Vital Signs BP 135/80   Pulse 61   Temp 98.1 F (36.7 C) (Oral)   Resp 13   Ht 5\' 11"  (1.803 m)   Wt 83.9 kg (185 lb)   SpO2 98%   BMI 25.80 kg/m   Physical Exam  Constitutional: He appears well-developed and well-nourished.  HENT:  Head: Normocephalic and atraumatic.  Eyes: Pupils are equal, round, and reactive to light. Conjunctivae are normal.  Neck: Neck supple. No tracheal deviation present. No thyromegaly present.  Cardiovascular: Normal rate and regular rhythm.   No murmur heard. Pulmonary/Chest: Effort normal  and breath sounds normal.  Abdominal: Soft. Bowel sounds are normal. He exhibits no distension. There is no tenderness.  Musculoskeletal: Normal range of motion. He exhibits no edema or tenderness.  Neurological: He is alert. Coordination normal.  Skin: Skin is warm and dry. No rash noted.  Psychiatric: He has a normal mood and affect.  Nursing note and vitals reviewed.    ED Treatments / Results  Labs (all labs ordered are listed, but only abnormal results are displayed) Labs Reviewed - No data to display  EKG  EKG Interpretation None       Radiology No results found.  Procedures Procedures (including critical care  time)  Medications Ordered in ED Medications - No data to display  Results for orders placed or performed during the hospital encounter of 05/31/16  Basic metabolic panel  Result Value Ref Range   Sodium 140 135 - 145 mmol/L   Potassium 3.7 3.5 - 5.1 mmol/L   Chloride 104 101 - 111 mmol/L   CO2 23 22 - 32 mmol/L   Glucose, Bld 94 65 - 99 mg/dL   BUN 6 6 - 20 mg/dL   Creatinine, Ser 4.090.79 0.61 - 1.24 mg/dL   Calcium 9.4 8.9 - 81.110.3 mg/dL   GFR calc non Af Amer >60 >60 mL/min   GFR calc Af Amer >60 >60 mL/min   Anion gap 13 5 - 15  CBC  Result Value Ref Range   WBC 14.4 (H) 4.0 - 10.5 K/uL   RBC 4.96 4.22 - 5.81 MIL/uL   Hemoglobin 15.7 13.0 - 17.0 g/dL   HCT 91.445.6 78.239.0 - 95.652.0 %   MCV 91.9 78.0 - 100.0 fL   MCH 31.7 26.0 - 34.0 pg   MCHC 34.4 30.0 - 36.0 g/dL   RDW 21.313.0 08.611.5 - 57.815.5 %   Platelets 407 (H) 150 - 400 K/uL  Urinalysis, Routine w reflex microscopic  Result Value Ref Range   Color, Urine AMBER (A) YELLOW   APPearance CLEAR CLEAR   Specific Gravity, Urine 1.027 1.005 - 1.030   pH 7.5 5.0 - 8.0   Glucose, UA NEGATIVE NEGATIVE mg/dL   Hgb urine dipstick NEGATIVE NEGATIVE   Bilirubin Urine SMALL (A) NEGATIVE   Ketones, ur >80 (A) NEGATIVE mg/dL   Protein, ur NEGATIVE NEGATIVE mg/dL   Nitrite NEGATIVE NEGATIVE   Leukocytes, UA NEGATIVE NEGATIVE  Lipase, blood  Result Value Ref Range   Lipase 22 11 - 51 U/L  Hepatic function panel  Result Value Ref Range   Total Protein 7.2 6.5 - 8.1 g/dL   Albumin 4.0 3.5 - 5.0 g/dL   AST 23 15 - 41 U/L   ALT 21 17 - 63 U/L   Alkaline Phosphatase 47 38 - 126 U/L   Total Bilirubin 0.7 0.3 - 1.2 mg/dL   Bilirubin, Direct <4.6<0.1 (L) 0.1 - 0.5 mg/dL   Indirect Bilirubin NOT CALCULATED 0.3 - 0.9 mg/dL  D-dimer, quantitative ([facility])  Result Value Ref Range   D-Dimer, Quant 0.29 0.00 - 0.50 ug/mL-FEU  I-stat troponin, ED  Result Value Ref Range   Troponin i, poc 0.00 0.00 - 0.08 ng/mL   Comment 3           Dg Chest 2  View  Result Date: 04/01/2017 CLINICAL DATA:  Cough, chest pain. EXAM: CHEST  2 VIEW COMPARISON:  05/31/2016 FINDINGS: Normal mediastinum and cardiac silhouette. Normal pulmonary vasculature. No evidence of effusion, infiltrate, or pneumothorax. No acute bony abnormality. IMPRESSION:  Normal chest radiograph. Electronically Signed   By: Genevive Bi M.D.   On: 04/01/2017 13:57   Initial Impression / Assessment and Plan / ED Course  I have reviewed the triage vital signs and the nursing notes.  Pertinent labs & imaging results that were available during my care of the patient were reviewed by me and considered in my medical decision making (see chart for details).     Chest x-ray viewed by me. Patient with acute bronchitis. I explained to him that it may take several weeks for symptoms to resolve. He has an albuterol inhaler which he can use 2 puffs every 4 hours. I counseled patient for 5 minutes on smoking cessation. Referral primary care.  Final Clinical Impressions(s) / ED Diagnoses  Diagnosis #1 acute bronchitis #2 tobacco abuse Final diagnoses:  None    New Prescriptions New Prescriptions   No medications on file     Doug Sou, MD 04/01/17 1510

## 2017-04-01 NOTE — ED Notes (Signed)
Patient transported to X-ray 

## 2017-07-22 ENCOUNTER — Emergency Department (HOSPITAL_COMMUNITY)
Admission: EM | Admit: 2017-07-22 | Discharge: 2017-07-23 | Discharge status: Against Medical Advice | Payer: Self-pay | Attending: Emergency Medicine | Admitting: Emergency Medicine

## 2017-07-22 ENCOUNTER — Encounter (HOSPITAL_COMMUNITY): Payer: Self-pay | Admitting: Emergency Medicine

## 2017-07-22 DIAGNOSIS — Z5321 Procedure and treatment not carried out due to patient leaving prior to being seen by health care provider: Secondary | ICD-10-CM | POA: Insufficient documentation

## 2017-07-22 LAB — CBC
HEMATOCRIT: 46.7 % (ref 39.0–52.0)
HEMOGLOBIN: 16.8 g/dL (ref 13.0–17.0)
MCH: 33 pg (ref 26.0–34.0)
MCHC: 36 g/dL (ref 30.0–36.0)
MCV: 91.7 fL (ref 78.0–100.0)
Platelets: 391 10*3/uL (ref 150–400)
RBC: 5.09 MIL/uL (ref 4.22–5.81)
RDW: 13.2 % (ref 11.5–15.5)
WBC: 19.5 10*3/uL — AB (ref 4.0–10.5)

## 2017-07-22 LAB — COMPREHENSIVE METABOLIC PANEL
ALT: 36 U/L (ref 17–63)
ANION GAP: 10 (ref 5–15)
AST: 29 U/L (ref 15–41)
Albumin: 4.9 g/dL (ref 3.5–5.0)
Alkaline Phosphatase: 52 U/L (ref 38–126)
BILIRUBIN TOTAL: 1.7 mg/dL — AB (ref 0.3–1.2)
BUN: 14 mg/dL (ref 6–20)
CO2: 25 mmol/L (ref 22–32)
Calcium: 9.7 mg/dL (ref 8.9–10.3)
Chloride: 103 mmol/L (ref 101–111)
Creatinine, Ser: 0.81 mg/dL (ref 0.61–1.24)
GFR calc Af Amer: >60 mL/min (ref >60)
Glucose, Bld: 121 mg/dL — ABNORMAL HIGH (ref 65–99)
POTASSIUM: 3.7 mmol/L (ref 3.5–5.1)
Sodium: 138 mmol/L (ref 135–145)
TOTAL PROTEIN: 8.3 g/dL — AB (ref 6.5–8.1)

## 2017-07-22 LAB — LIPASE, BLOOD: Lipase: 19 U/L (ref 11–51)

## 2017-07-22 NOTE — ED Notes (Signed)
Patient called to be placed in treatment room with no response.  

## 2017-07-22 NOTE — ED Notes (Signed)
Pt called for room. No response from lobby

## 2017-07-22 NOTE — ED Notes (Signed)
Bed: WA23 Expected date:  Expected time:  Means of arrival:  Comments: 

## 2017-07-22 NOTE — ED Triage Notes (Addendum)
Per EMS, patient from home, c/o abdominal pain with N/V/D since yesterday. Last emesis today around noon.   HR 90

## 2017-07-28 ENCOUNTER — Emergency Department (HOSPITAL_COMMUNITY)
Admission: EM | Admit: 2017-07-28 | Discharge: 2017-07-28 | Disposition: A | Discharge status: Home / Self Care | Payer: Self-pay | Attending: Emergency Medicine | Admitting: Emergency Medicine

## 2017-07-28 ENCOUNTER — Encounter (HOSPITAL_COMMUNITY): Payer: Self-pay

## 2017-07-28 ENCOUNTER — Other Ambulatory Visit: Payer: Self-pay

## 2017-07-28 DIAGNOSIS — R197 Diarrhea, unspecified: Secondary | ICD-10-CM | POA: Insufficient documentation

## 2017-07-28 DIAGNOSIS — F17210 Nicotine dependence, cigarettes, uncomplicated: Secondary | ICD-10-CM | POA: Insufficient documentation

## 2017-07-28 DIAGNOSIS — R112 Nausea with vomiting, unspecified: Secondary | ICD-10-CM | POA: Insufficient documentation

## 2017-07-28 LAB — COMPREHENSIVE METABOLIC PANEL
ALT: 38 U/L (ref 17–63)
AST: 29 U/L (ref 15–41)
Albumin: 4.5 g/dL (ref 3.5–5.0)
Alkaline Phosphatase: 49 U/L (ref 38–126)
Anion gap: 14 (ref 5–15)
BUN: 8 mg/dL (ref 6–20)
CO2: 18 mmol/L — ABNORMAL LOW (ref 22–32)
Calcium: 9.4 mg/dL (ref 8.9–10.3)
Chloride: 108 mmol/L (ref 101–111)
Creatinine, Ser: 0.84 mg/dL (ref 0.61–1.24)
GFR calc Af Amer: >60 mL/min (ref >60)
GFR calc non Af Amer: >60 mL/min (ref >60)
Glucose, Bld: 97 mg/dL (ref 65–99)
Potassium: 3.9 mmol/L (ref 3.5–5.1)
Sodium: 140 mmol/L (ref 135–145)
Total Bilirubin: 0.9 mg/dL (ref 0.3–1.2)
Total Protein: 7.9 g/dL (ref 6.5–8.1)

## 2017-07-28 LAB — CBC
HCT: 47.2 % (ref 39.0–52.0)
Hemoglobin: 17 g/dL (ref 13.0–17.0)
MCH: 32.9 pg (ref 26.0–34.0)
MCHC: 36 g/dL (ref 30.0–36.0)
MCV: 91.5 fL (ref 78.0–100.0)
Platelets: 329 10*3/uL (ref 150–400)
RBC: 5.16 MIL/uL (ref 4.22–5.81)
RDW: 13.3 % (ref 11.5–15.5)
WBC: 19.2 10*3/uL — ABNORMAL HIGH (ref 4.0–10.5)

## 2017-07-28 LAB — URINALYSIS, ROUTINE W REFLEX MICROSCOPIC
Bilirubin Urine: NEGATIVE
Glucose, UA: NEGATIVE mg/dL
Hgb urine dipstick: NEGATIVE
Ketones, ur: 20 mg/dL — AB
Leukocytes, UA: NEGATIVE
Nitrite: NEGATIVE
Protein, ur: NEGATIVE mg/dL
Specific Gravity, Urine: 1.017 (ref 1.005–1.030)
pH: 7 (ref 5.0–8.0)

## 2017-07-28 LAB — LIPASE, BLOOD: Lipase: 31 U/L (ref 11–51)

## 2017-07-28 MED ORDER — PROMETHAZINE HCL 25 MG/ML IJ SOLN
12.5000 mg | Freq: Once | INTRAMUSCULAR | Status: AC
  Administered 2017-07-28: 12.5 mg via INTRAVENOUS
  Filled 2017-07-28: qty 1

## 2017-07-28 MED ORDER — LACTATED RINGERS IV BOLUS (SEPSIS)
1000.0000 mL | Freq: Once | INTRAVENOUS | Status: AC
  Administered 2017-07-28: 1000 mL via INTRAVENOUS

## 2017-07-28 MED ORDER — MORPHINE SULFATE (PF) 4 MG/ML IV SOLN
4.0000 mg | Freq: Once | INTRAVENOUS | Status: AC
  Administered 2017-07-28: 4 mg via INTRAVENOUS
  Filled 2017-07-28: qty 1

## 2017-07-28 MED ORDER — PROMETHAZINE HCL 12.5 MG PO TABS: 12.5000 mg | ORAL_TABLET | Freq: Four times a day (QID) | ORAL | 0 refills | Status: DC | PRN

## 2017-07-28 MED ORDER — KETOROLAC TROMETHAMINE 15 MG/ML IJ SOLN
15.0000 mg | Freq: Once | INTRAMUSCULAR | Status: AC
  Administered 2017-07-28: 15 mg via INTRAVENOUS
  Filled 2017-07-28: qty 1

## 2017-07-28 MED ORDER — LOPERAMIDE HCL 2 MG PO CAPS
4.0000 mg | ORAL_CAPSULE | Freq: Once | ORAL | Status: AC
  Administered 2017-07-28: 4 mg via ORAL
  Filled 2017-07-28: qty 2

## 2017-07-28 NOTE — ED Triage Notes (Signed)
He c/p persistent n/v for just over a week. He states he was seen here this Sun. For same. He also c/o some llq area abd. Pain. He arrives in no distress. He is chiling, however he is not found to be febrile.

## 2017-07-28 NOTE — ED Notes (Signed)
Pt cannot use restroom at this time, aware urine specimen is needed.  

## 2017-07-31 NOTE — ED Provider Notes (Signed)
[facility]-EMERGENCY DEPT Provider Note   CSN: [id number] Arrival date & time: 07/28/17  1159     History   Chief Complaint Chief Complaint  Patient presents with  . Emesis    HPI Christopher Estes is a 32 y.o. male.  HPI   32 year old male with nausea, vomiting and diarrhea.  Symptoms started several days ago.  Initially felt that he may just have a GI bug but is coming in today because he feels like his symptoms are resolved by now.  Is having diffuse crampy abdominal pain that comes and goes.  Somewhat worse in the left lower quadrant.  No blood in stool or emesis.  No urinary complaints.  No fevers but has felt chilled at times.  No sick contacts.  Past Medical History:  Diagnosis Date  . Hyperlipidemia   . Insomnia   . Migraine     There are no active problems to display for this patient.   Past Surgical History:  Procedure Laterality Date  . INNER EAR SURGERY    . TONSILLECTOMY         Home Medications    Prior to Admission medications   Medication Sig Start Date End Date Taking? Authorizing Provider  PAPAYA PO Take 4 tablets by mouth daily. Chewable   Yes [provider]  benzonatate (TESSALON) 100 MG capsule Take 1 capsule (100 mg total) by mouth every 8 (eight) hours. Patient not taking: Reported on 07/28/2017 03/29/17   Janne NapoleonNeese, Hope M, NP  HYDROcodone-acetaminophen (NORCO/VICODIN) 5-325 MG tablet Take 1-2 tablets by mouth every 4 (four) hours as needed for moderate pain or severe pain. Patient not taking: Reported on 07/28/2017 12/30/16   Shaune PollackIsaacs, Cameron, MD  metoCLOPramide (REGLAN) 10 MG tablet Take 1 tablet (10 mg total) by mouth every 8 (eight) hours as needed for nausea or vomiting. Patient not taking: Reported on 07/28/2017 04/18/16   Azalia Bilisampos, Kevin, MD  ondansetron (ZOFRAN ODT) 8 MG disintegrating tablet Take 1 tablet (8 mg total) by mouth every 8 (eight) hours as needed for nausea or vomiting. Patient not taking: Reported on  05/31/2016 04/18/16   Azalia Bilisampos, Kevin, MD  promethazine (PHENERGAN) 12.5 MG tablet Take 1 tablet (12.5 mg total) by mouth every 6 (six) hours as needed for nausea or vomiting. 07/28/17   Raeford RazorKohut, Alka Falwell, MD    Family History Family History  Problem Relation Age of Onset  . Diabetes Mother   . Hypertension Mother   . Asthma Mother   . Cancer Father   . Stroke Father   . Thyroid disease Father     Social History Social History   Tobacco Use  . Smoking status: Current Every Day Smoker    Packs/day: 1.00    Types: Cigarettes  . Smokeless tobacco: Never Used  Substance Use Topics  . Alcohol use: Yes    Alcohol/week: 14.4 oz    Types: 24 Cans of beer per week    Comment: weekly  . Drug use: Yes    Types: Marijuana     Allergies   Dilaudid [hydromorphone hcl]   Review of Systems Review of Systems All systems reviewed and negative, other than as noted in HPI.   Physical Exam Updated Vital Signs BP 124/81   Pulse 70   Temp 98.2 F (36.8 C) (Oral)   Resp 14   SpO2 98%   Physical Exam  Constitutional: He appears well-developed and well-nourished. No distress.  HENT:  Head: Normocephalic and atraumatic.  Eyes: Conjunctivae are  normal. Right eye exhibits no discharge. Left eye exhibits no discharge.  Neck: Neck supple.  Cardiovascular: Normal rate, regular rhythm and normal heart sounds. Exam reveals no gallop and no friction rub.  No murmur heard. Pulmonary/Chest: Effort normal and breath sounds normal. No respiratory distress.  Abdominal: Soft. He exhibits no distension. There is no tenderness.  Musculoskeletal: He exhibits no edema or tenderness.  Neurological: He is alert.  Skin: Skin is warm and dry.  Psychiatric: He has a normal mood and affect. His behavior is normal. Thought content normal.  Nursing note and vitals reviewed.    ED Treatments / Results  Labs (all labs ordered are listed, but only abnormal results are displayed) Labs Reviewed    COMPREHENSIVE METABOLIC PANEL - Abnormal; Notable for the following components:      Result Value   CO2 18 (*)    All other components within normal limits  CBC - Abnormal; Notable for the following components:   WBC 19.2 (*)    All other components within normal limits  URINALYSIS, ROUTINE W REFLEX MICROSCOPIC - Abnormal; Notable for the following components:   Ketones, ur 20 (*)    All other components within normal limits  LIPASE, BLOOD    EKG  EKG Interpretation None       Radiology No results found.   No results found.  Procedures Procedures (including critical care time)  Medications Ordered in ED Medications  lactated ringers bolus 1,000 mL (0 mLs Intravenous Stopped 07/28/17 1611)  promethazine (PHENERGAN) injection 12.5 mg (12.5 mg Intravenous Given 07/28/17 1337)  morphine 4 MG/ML injection 4 mg (4 mg Intravenous Given 07/28/17 1336)  loperamide (IMODIUM) capsule 4 mg (4 mg Oral Given 07/28/17 1337)  ketorolac (TORADOL) 15 MG/ML injection 15 mg (15 mg Intravenous Given 07/28/17 1336)     Initial Impression / Assessment and Plan / ED Course  I have reviewed the triage vital signs and the nursing notes.  Pertinent labs & imaging results that were available during my care of the patient were reviewed by me and considered in my medical decision making (see chart for details).     32 year old male with nausea/vomiting/diarrhea.  Suspect viral GI illness.  His normal exam is actually pretty benign.  He is afebrile.  Generally well-appearing.  Treated symptomatically with improvement.  Plan to continue symptomatic treatment.  Push fluids.  Return precautions discussed.  Final Clinical Impressions(s) / ED Diagnoses   Final diagnoses:  Nausea vomiting and diarrhea    ED Discharge Orders        Ordered    promethazine (PHENERGAN) 12.5 MG tablet  Every 6 hours PRN     07/28/17 1547       Raeford Razor, MD 07/31/17 1738

## 2017-09-05 ENCOUNTER — Encounter (HOSPITAL_COMMUNITY): Payer: Self-pay | Admitting: *Deleted

## 2017-09-05 ENCOUNTER — Emergency Department (HOSPITAL_COMMUNITY)
Admission: EM | Admit: 2017-09-05 | Discharge: 2017-09-05 | Disposition: A | Discharge status: Home / Self Care | Payer: Self-pay | Attending: Emergency Medicine | Admitting: Emergency Medicine

## 2017-09-05 ENCOUNTER — Emergency Department (HOSPITAL_COMMUNITY)
Admission: EM | Admit: 2017-09-05 | Discharge: 2017-09-05 | Discharge status: Against Medical Advice | Payer: Self-pay | Attending: Emergency Medicine | Admitting: Emergency Medicine

## 2017-09-05 ENCOUNTER — Encounter (HOSPITAL_COMMUNITY): Payer: Self-pay | Admitting: Emergency Medicine

## 2017-09-05 DIAGNOSIS — F17210 Nicotine dependence, cigarettes, uncomplicated: Secondary | ICD-10-CM | POA: Insufficient documentation

## 2017-09-05 DIAGNOSIS — Z5321 Procedure and treatment not carried out due to patient leaving prior to being seen by health care provider: Secondary | ICD-10-CM | POA: Insufficient documentation

## 2017-09-05 DIAGNOSIS — Z79899 Other long term (current) drug therapy: Secondary | ICD-10-CM | POA: Insufficient documentation

## 2017-09-05 DIAGNOSIS — R1111 Vomiting without nausea: Secondary | ICD-10-CM | POA: Insufficient documentation

## 2017-09-05 LAB — URINALYSIS, ROUTINE W REFLEX MICROSCOPIC
Bacteria, UA: NONE SEEN
Bilirubin Urine: NEGATIVE
GLUCOSE, UA: NEGATIVE mg/dL
Hgb urine dipstick: NEGATIVE
KETONES UR: 80 mg/dL — AB
Leukocytes, UA: NEGATIVE
Nitrite: NEGATIVE
Protein, ur: 100 mg/dL — AB
Specific Gravity, Urine: 1.02 (ref 1.005–1.030)
pH: 9 — ABNORMAL HIGH (ref 5.0–8.0)

## 2017-09-05 LAB — COMPREHENSIVE METABOLIC PANEL
ALBUMIN: 4.9 g/dL (ref 3.5–5.0)
ALT: 35 U/L (ref 17–63)
ANION GAP: 11 (ref 5–15)
AST: 38 U/L (ref 15–41)
Alkaline Phosphatase: 47 U/L (ref 38–126)
BUN: 10 mg/dL (ref 6–20)
CHLORIDE: 108 mmol/L (ref 101–111)
CO2: 21 mmol/L — AB (ref 22–32)
Calcium: 9.9 mg/dL (ref 8.9–10.3)
Creatinine, Ser: 0.75 mg/dL (ref 0.61–1.24)
GFR calc non Af Amer: >60 mL/min (ref >60)
Glucose, Bld: 122 mg/dL — ABNORMAL HIGH (ref 65–99)
Potassium: 3.7 mmol/L (ref 3.5–5.1)
SODIUM: 140 mmol/L (ref 135–145)
Total Bilirubin: 1 mg/dL (ref 0.3–1.2)
Total Protein: 7.9 g/dL (ref 6.5–8.1)

## 2017-09-05 LAB — CBC
HCT: 48.2 % (ref 39.0–52.0)
HEMOGLOBIN: 17.1 g/dL — AB (ref 13.0–17.0)
MCH: 32.8 pg (ref 26.0–34.0)
MCHC: 35.5 g/dL (ref 30.0–36.0)
MCV: 92.3 fL (ref 78.0–100.0)
Platelets: 400 10*3/uL (ref 150–400)
RBC: 5.22 MIL/uL (ref 4.22–5.81)
RDW: 13.2 % (ref 11.5–15.5)
WBC: 15.9 10*3/uL — ABNORMAL HIGH (ref 4.0–10.5)

## 2017-09-05 LAB — RAPID URINE DRUG SCREEN, HOSP PERFORMED
Amphetamines: NOT DETECTED
BARBITURATES: NOT DETECTED
BENZODIAZEPINES: NOT DETECTED
Cocaine: NOT DETECTED
Opiates: NOT DETECTED
Tetrahydrocannabinol: POSITIVE — AB

## 2017-09-05 LAB — OCCULT BLOOD GASTRIC / DUODENUM (SPECIMEN CUP)
OCCULT BLOOD, GASTRIC: NEGATIVE
pH, Gastric: 3

## 2017-09-05 LAB — LIPASE, BLOOD: LIPASE: 22 U/L (ref 11–51)

## 2017-09-05 MED ORDER — ONDANSETRON 4 MG PO TBDP: 4.0000 mg | ORAL_TABLET | Freq: Once | ORAL | Status: DC | PRN

## 2017-09-05 MED ORDER — SODIUM CHLORIDE 0.9 % IV BOLUS (SEPSIS)
2000.0000 mL | Freq: Once | INTRAVENOUS | Status: AC
  Administered 2017-09-05: 2000 mL via INTRAVENOUS

## 2017-09-05 MED ORDER — ONDANSETRON HCL 4 MG PO TABS: 4.0000 mg | ORAL_TABLET | Freq: Three times a day (TID) | ORAL | 0 refills | Status: DC | PRN

## 2017-09-05 MED ORDER — CAPSAICIN 0.025 % EX CREA
TOPICAL_CREAM | Freq: Once | CUTANEOUS | Status: DC
  Filled 2017-09-05: qty 60

## 2017-09-05 MED ORDER — ONDANSETRON 4 MG PO TBDP
4.0000 mg | ORAL_TABLET | Freq: Once | ORAL | Status: AC | PRN
  Administered 2017-09-05: 4 mg via ORAL
  Filled 2017-09-05: qty 1

## 2017-09-05 MED ORDER — HALOPERIDOL LACTATE 5 MG/ML IJ SOLN
5.0000 mg | Freq: Once | INTRAMUSCULAR | Status: AC
  Administered 2017-09-05: 5 mg via INTRAVENOUS
  Filled 2017-09-05: qty 1

## 2017-09-05 MED ORDER — SODIUM CHLORIDE 0.9 % IV BOLUS (SEPSIS): 1000.0000 mL | Freq: Once | INTRAVENOUS | Status: DC

## 2017-09-05 MED ORDER — ONDANSETRON HCL 4 MG/2ML IJ SOLN
4.0000 mg | Freq: Once | INTRAMUSCULAR | Status: AC
Start: 2017-09-05 — End: 2017-09-05
  Administered 2017-09-05: 4 mg via INTRAVENOUS
  Filled 2017-09-05: qty 2

## 2017-09-05 MED ORDER — FAMOTIDINE IN NACL 20-0.9 MG/50ML-% IV SOLN
20.0000 mg | Freq: Once | INTRAVENOUS | Status: AC
  Administered 2017-09-05: 20 mg via INTRAVENOUS
  Filled 2017-09-05: qty 50

## 2017-09-05 NOTE — ED Triage Notes (Signed)
Patient here from home with complaints of abdominal pain, nausea, vomiting x2 days. Reports that vomiting blood since this morning. Hx of same.

## 2017-09-05 NOTE — ED Provider Notes (Signed)
[facility]-EMERGENCY DEPT Provider Note   CSN: [id number] Arrival date & time: 09/05/17  0950     History   Chief Complaint Chief Complaint  Patient presents with  . Abdominal Pain  . Hematemesis    HPI   Blood pressure 139/86, pulse 77, temperature 97.8 F (36.6 C), temperature source Oral, resp. rate 18, SpO2 96 %.  Christopher Estes is a 32 y.o. male complaining of multiple episodes of emesis over the last 48 hours with associated severe upper abdominal pain and he feels dehydrated and dizzy with no syncope.  He denies fevers, chills, sick contacts, diarrhea.  He does report reduced urinary output.  He does have episodes of severe emesis.  He states he has not smoked marijuana in 3 weeks.  The emesis turned slightly bloody this morning.  He is not reporting any melena with this.  He is on anticoagulated.  He does not take excessive NSAIDs or drink regularly or have any known liver issues.  Past Medical History:  Diagnosis Date  . Hyperlipidemia   . Insomnia   . Migraine     There are no active problems to display for this patient.   Past Surgical History:  Procedure Laterality Date  . INNER EAR SURGERY    . TONSILLECTOMY         Home Medications    Prior to Admission medications   Medication Sig Start Date End Date Taking? Authorizing Provider  benzonatate (TESSALON) 100 MG capsule Take 1 capsule (100 mg total) by mouth every 8 (eight) hours. Patient not taking: Reported on 07/28/2017 03/29/17   Janne Napoleon, NP  HYDROcodone-acetaminophen (NORCO/VICODIN) 5-325 MG tablet Take 1-2 tablets by mouth every 4 (four) hours as needed for moderate pain or severe pain. Patient not taking: Reported on 09/05/2017 12/30/16   Shaune Pollack, MD  metoCLOPramide (REGLAN) 10 MG tablet Take 1 tablet (10 mg total) by mouth every 8 (eight) hours as needed for nausea or vomiting. Patient not taking: Reported on 07/28/2017 04/18/16   Azalia Bilis, MD  ondansetron (ZOFRAN  ODT) 8 MG disintegrating tablet Take 1 tablet (8 mg total) by mouth every 8 (eight) hours as needed for nausea or vomiting. Patient not taking: Reported on 05/31/2016 04/18/16   Azalia Bilis, MD  ondansetron (ZOFRAN) 4 MG tablet Take 1 tablet (4 mg total) by mouth every 8 (eight) hours as needed for nausea or vomiting. 09/05/17   Coralie Stanke, Joni Reining, PA-C  promethazine (PHENERGAN) 12.5 MG tablet Take 1 tablet (12.5 mg total) by mouth every 6 (six) hours as needed for nausea or vomiting. Patient not taking: Reported on 09/05/2017 07/28/17   Raeford Razor, MD    Family History Family History  Problem Relation Age of Onset  . Diabetes Mother   . Hypertension Mother   . Asthma Mother   . Cancer Father   . Stroke Father   . Thyroid disease Father     Social History Social History   Tobacco Use  . Smoking status: Current Every Day Smoker    Packs/day: 1.00    Types: Cigarettes  . Smokeless tobacco: Never Used  Substance Use Topics  . Alcohol use: Yes    Alcohol/week: 14.4 oz    Types: 24 Cans of beer per week    Comment: weekly  . Drug use: Yes    Types: Marijuana     Allergies   Dilaudid [hydromorphone hcl]   Review of Systems Review of Systems  A complete review of systems  was obtained and all systems are negative except as noted in the HPI and PMH.    Physical Exam Updated Vital Signs BP 132/81 (BP Location: Left Arm)   Pulse 72   Temp 97.8 F (36.6 C) (Oral)   Resp 18   SpO2 96%   Physical Exam  Constitutional: He is oriented to person, place, and time. He appears well-developed and well-nourished. No distress.  HENT:  Head: Normocephalic and atraumatic.  Mouth/Throat: Oropharynx is clear and moist.  Eyes: Conjunctivae and EOM are normal. Pupils are equal, round, and reactive to light.  Neck: Normal range of motion.  Cardiovascular: Normal rate, regular rhythm and intact distal pulses.  Pulmonary/Chest: Effort normal and breath sounds normal.  Abdominal: Soft.  There is no tenderness.  Musculoskeletal: Normal range of motion.  Neurological: He is alert and oriented to person, place, and time.  Skin: He is not diaphoretic.  Psychiatric: He has a normal mood and affect.  Nursing note and vitals reviewed.    ED Treatments / Results  Labs (all labs ordered are listed, but only abnormal results are displayed) Labs Reviewed  COMPREHENSIVE METABOLIC PANEL - Abnormal; Notable for the following components:      Result Value   CO2 21 (*)    Glucose, Bld 122 (*)    All other components within normal limits  CBC - Abnormal; Notable for the following components:   WBC 15.9 (*)    Hemoglobin 17.1 (*)    All other components within normal limits  URINALYSIS, ROUTINE W REFLEX MICROSCOPIC - Abnormal; Notable for the following components:   pH 9.0 (*)    Ketones, ur 80 (*)    Protein, ur 100 (*)    Squamous Epithelial / LPF 0-5 (*)    All other components within normal limits  RAPID URINE DRUG SCREEN, HOSP PERFORMED - Abnormal; Notable for the following components:   Tetrahydrocannabinol POSITIVE (*)    All other components within normal limits  LIPASE, BLOOD  OCCULT BLOOD GASTRIC / DUODENUM (SPECIMEN CUP)  RAPID URINE DRUG SCREEN, HOSP PERFORMED    EKG  EKG Interpretation None       Radiology No results found.  Procedures Procedures (including critical care time)  Medications Ordered in ED Medications  capsaicin (ZOSTRIX) 0.025 % cream (not administered)  ondansetron (ZOFRAN-ODT) disintegrating tablet 4 mg (4 mg Oral Given 09/05/17 1155)  haloperidol lactate (HALDOL) injection 5 mg (5 mg Intravenous Given 09/05/17 1413)  famotidine (PEPCID) IVPB 20 mg premix (0 mg Intravenous Stopped 09/05/17 1443)  ondansetron (ZOFRAN) injection 4 mg (4 mg Intravenous Given 09/05/17 1413)  sodium chloride 0.9 % bolus 2,000 mL (0 mLs Intravenous Stopped 09/05/17 1514)     Initial Impression / Assessment and Plan / ED Course  I have reviewed the triage  vital signs and the nursing notes.  Pertinent labs & imaging results that were available during my care of the patient were reviewed by me and considered in my medical decision making (see chart for details).     Vitals:   09/05/17 1000 09/05/17 1538  BP: 139/86 132/81  Pulse: 77 72  Resp: 18 18  Temp: 97.8 F (36.6 C)   TempSrc: Oral   SpO2: 96% 96%    Medications  capsaicin (ZOSTRIX) 0.025 % cream (not administered)  ondansetron (ZOFRAN-ODT) disintegrating tablet 4 mg (4 mg Oral Given 09/05/17 1155)  haloperidol lactate (HALDOL) injection 5 mg (5 mg Intravenous Given 09/05/17 1413)  famotidine (PEPCID) IVPB 20 mg premix (0  mg Intravenous Stopped 09/05/17 1443)  ondansetron (ZOFRAN) injection 4 mg (4 mg Intravenous Given 09/05/17 1413)  sodium chloride 0.9 % bolus 2,000 mL (0 mLs Intravenous Stopped 09/05/17 1514)    Shawnte Chestine SporeClark is 32 y.o. male presenting with multiple episodes of nausea and vomiting over the last several days it turned bloody, suspect Mallory-Weiss tear.  Will check a gastric cold.  Abdominal exam with some mild tenderness along the epigastrium.  I think this is likely related to cannabinoid hyperemesis, patient afebrile, vital signs reassuring, no peritoneal signs.  Of encourage patient to continue with cessation of marijuana and told him he will need to not smoke for at least 6 months to make sure it is cleared out of his system.  Blood work reassuring with no significant electrolyte abnormality, patient positive for THC, greater than 80 ketones in the urine.  He feels much better after hydration, he is tolerating p.o.'s.  Of advised him to try to DC smoking marijuana.  Evaluation does not show pathology that would require ongoing emergent intervention or inpatient treatment. Pt is hemodynamically stable and mentating appropriately. Discussed findings and plan with patient/guardian, who agrees with care plan. All questions answered. Return precautions discussed and outpatient  follow up given.    Final Clinical Impressions(s) / ED Diagnoses   Final diagnoses:  Vomiting without nausea, intractability of vomiting not specified, unspecified vomiting type    ED Discharge Orders        Ordered    ondansetron (ZOFRAN) 4 MG tablet  Every 8 hours PRN     09/05/17 1547       Maiyah Goyne, Mardella Laymanicole, PA-C 09/05/17 1642    Bethann BerkshireZammit, Joseph, MD 09/06/17 1255

## 2017-09-05 NOTE — ED Notes (Signed)
Bed: [facility]1 Expected date:  Expected time:  Means of arrival:  Comments: 

## 2017-09-05 NOTE — ED Triage Notes (Signed)
Per EMS, p complains of abdominal pain and nausea x 2 days, pt states he has been vomiting blood.   BP 158/96 HR 80 CBG 104

## 2017-09-05 NOTE — ED Notes (Signed)
Pt given ginger ale.

## 2017-09-05 NOTE — ED Notes (Signed)
Pt left before receiving discharge paperwork. 

## 2017-09-05 NOTE — ED Notes (Signed)
Bed: [facility]PT4 Expected date:  Expected time:  Means of arrival:  Comments: 

## 2017-09-05 NOTE — ED Notes (Signed)
Bed: [facility]2 Expected date:  Expected time:  Means of arrival:  Comments: 

## 2017-09-05 NOTE — ED Notes (Signed)
Per registration, patient decided to leave 

## 2017-09-05 NOTE — Discharge Instructions (Signed)

## 2017-09-05 NOTE — ED Notes (Signed)
Bed: [facility]05 Expected date:  Expected time:  Means of arrival:  Comments: 

## 2017-10-03 ENCOUNTER — Encounter (HOSPITAL_COMMUNITY): Payer: Self-pay

## 2017-10-03 ENCOUNTER — Other Ambulatory Visit: Payer: Self-pay

## 2017-10-03 ENCOUNTER — Emergency Department (HOSPITAL_COMMUNITY)
Admission: EM | Admit: 2017-10-03 | Discharge: 2017-10-03 | Discharge status: Against Medical Advice | Payer: Self-pay | Attending: Emergency Medicine | Admitting: Emergency Medicine

## 2017-10-03 ENCOUNTER — Emergency Department (HOSPITAL_COMMUNITY): Payer: Self-pay

## 2017-10-03 DIAGNOSIS — F17210 Nicotine dependence, cigarettes, uncomplicated: Secondary | ICD-10-CM | POA: Insufficient documentation

## 2017-10-03 DIAGNOSIS — R0789 Other chest pain: Secondary | ICD-10-CM | POA: Insufficient documentation

## 2017-10-03 LAB — BASIC METABOLIC PANEL
ANION GAP: 12 (ref 5–15)
BUN: 11 mg/dL (ref 6–20)
CALCIUM: 9.9 mg/dL (ref 8.9–10.3)
CO2: 21 mmol/L — AB (ref 22–32)
CREATININE: 0.87 mg/dL (ref 0.61–1.24)
Chloride: 105 mmol/L (ref 101–111)
Glucose, Bld: 112 mg/dL — ABNORMAL HIGH (ref 65–99)
Potassium: 3.7 mmol/L (ref 3.5–5.1)
SODIUM: 138 mmol/L (ref 135–145)

## 2017-10-03 LAB — I-STAT TROPONIN, ED: Troponin i, poc: 0 ng/mL (ref 0.00–0.08)

## 2017-10-03 LAB — HEPATIC FUNCTION PANEL
ALK PHOS: 53 U/L (ref 38–126)
ALT: 51 U/L (ref 17–63)
AST: 44 U/L — AB (ref 15–41)
Albumin: 4.8 g/dL (ref 3.5–5.0)
BILIRUBIN DIRECT: 0.1 mg/dL (ref 0.1–0.5)
BILIRUBIN INDIRECT: 0.9 mg/dL (ref 0.3–0.9)
Total Bilirubin: 1 mg/dL (ref 0.3–1.2)
Total Protein: 8.1 g/dL (ref 6.5–8.1)

## 2017-10-03 LAB — CBC
HCT: 50.4 % (ref 39.0–52.0)
HEMOGLOBIN: 17.7 g/dL — AB (ref 13.0–17.0)
MCH: 33.2 pg (ref 26.0–34.0)
MCHC: 35.1 g/dL (ref 30.0–36.0)
MCV: 94.6 fL (ref 78.0–100.0)
PLATELETS: 350 10*3/uL (ref 150–400)
RBC: 5.33 MIL/uL (ref 4.22–5.81)
RDW: 13.1 % (ref 11.5–15.5)
WBC: 11.3 10*3/uL — AB (ref 4.0–10.5)

## 2017-10-03 LAB — D-DIMER, QUANTITATIVE: D-Dimer, Quant: <0.27 ug/mL-FEU (ref 0.00–0.50)

## 2017-10-03 LAB — LIPASE, BLOOD: Lipase: 24 U/L (ref 11–51)

## 2017-10-03 MED ORDER — FAMOTIDINE IN NACL 20-0.9 MG/50ML-% IV SOLN
20.0000 mg | Freq: Once | INTRAVENOUS | Status: AC
  Administered 2017-10-03: 20 mg via INTRAVENOUS
  Filled 2017-10-03: qty 50

## 2017-10-03 MED ORDER — ONDANSETRON HCL 4 MG/2ML IJ SOLN
4.0000 mg | Freq: Once | INTRAMUSCULAR | Status: AC
  Administered 2017-10-03: 4 mg via INTRAVENOUS
  Filled 2017-10-03: qty 2

## 2017-10-03 MED ORDER — HALOPERIDOL LACTATE 5 MG/ML IJ SOLN
5.0000 mg | Freq: Once | INTRAMUSCULAR | Status: AC
  Administered 2017-10-03: 5 mg via INTRAVENOUS
  Filled 2017-10-03: qty 1

## 2017-10-03 MED ORDER — SODIUM CHLORIDE 0.9 % IV BOLUS (SEPSIS)
1000.0000 mL | Freq: Once | INTRAVENOUS | Status: AC
  Administered 2017-10-03: 1000 mL via INTRAVENOUS

## 2017-10-03 NOTE — ED Notes (Signed)
Pt is leaving AMA

## 2017-10-03 NOTE — ED Notes (Signed)
Bed: WA03 Expected date:  Expected time:  Means of arrival:  Comments: 

## 2017-10-03 NOTE — ED Notes (Signed)
Patient walked to the restroom without assistance from staff. Patient has bowl movement but no urine for sample.

## 2017-10-03 NOTE — ED Provider Notes (Signed)
[facility]-EMERGENCY DEPT Provider Note   CSN: [id number] Arrival date & time: 10/03/17  0906     History   Chief Complaint Chief Complaint  Patient presents with  . Chest Pain    HPI Christopher Estes is a 32 y.o. male.  HPI   Patient is a 32 year old male history of cannabis use presented to ED today complaining of central chest pain that began at around 5 AM this morning.  States pain is heavy and radiates to the left shoulder.  Rates it 6/10.  States it is constant.  Worse when he takes a deep breath.  He has not tried anything for the symptoms.  He also reports associated shortness of breath.  Denies any lower extremity swelling or pain in his calves. States he has a family history of VTE (mother).  Also with nausea, vomiting, abominal pain beginning this morning.  Reports epigastric abdominal pain that is constant in nature.  He believes it is associated with multiple episodes of vomiting.  Also reports one episode of diarrhea that occurred in the ER.  No bloody stool or bloody emesis.  No recent URI symptoms.    Denies leg pain/swelling, hemoptysis, recent surgery/trauma, recent long travel, hormone use, personal hx of cancer, or personal hx of DVT/PE.    Past Medical History:  Diagnosis Date  . Hyperlipidemia   . Insomnia   . Migraine     There are no active problems to display for this patient.   Past Surgical History:  Procedure Laterality Date  . INNER EAR SURGERY    . TONSILLECTOMY         Home Medications    Prior to Admission medications   Medication Sig Start Date End Date Taking? Authorizing Provider  benzonatate (TESSALON) 100 MG capsule Take 1 capsule (100 mg total) by mouth every 8 (eight) hours. Patient not taking: Reported on 07/28/2017 03/29/17   Janne NapoleonNeese, Hope M, NP  metoCLOPramide (REGLAN) 10 MG tablet Take 1 tablet (10 mg total) by mouth every 8 (eight) hours as needed for nausea or vomiting. Patient not taking: Reported on  07/28/2017 04/18/16   Azalia Bilisampos, Kevin, MD  ondansetron (ZOFRAN) 4 MG tablet Take 1 tablet (4 mg total) by mouth every 8 (eight) hours as needed for nausea or vomiting. Patient not taking: Reported on 10/03/2017 09/05/17   Pisciotta, Joni ReiningNicole, PA-C  promethazine (PHENERGAN) 12.5 MG tablet Take 1 tablet (12.5 mg total) by mouth every 6 (six) hours as needed for nausea or vomiting. Patient not taking: Reported on 09/05/2017 07/28/17   Raeford RazorKohut, Stephen, MD    Family History Family History  Problem Relation Age of Onset  . Diabetes Mother   . Hypertension Mother   . Asthma Mother   . Cancer Father   . Stroke Father   . Thyroid disease Father     Social History Social History   Tobacco Use  . Smoking status: Current Every Day Smoker    Packs/day: 1.00    Types: Cigarettes  . Smokeless tobacco: Never Used  Substance Use Topics  . Alcohol use: Yes    Alcohol/week: 14.4 oz    Types: 24 Cans of beer per week    Comment: weekly  . Drug use: Yes    Types: Marijuana     Allergies   Dilaudid [hydromorphone hcl]   Review of Systems Review of Systems  Constitutional: Negative for fever.  HENT: Negative for sore throat.   Eyes: Negative for visual disturbance.  Respiratory: Positive  for shortness of breath. Negative for cough and wheezing.   Cardiovascular: Positive for chest pain. Negative for palpitations and leg swelling.  Gastrointestinal: Positive for abdominal pain, diarrhea, nausea and vomiting. Negative for blood in stool and constipation.  Genitourinary: Negative for decreased urine volume and flank pain.  Musculoskeletal: Negative for back pain.  Skin: Negative for rash.  Neurological: Negative for headaches.     Physical Exam Updated Vital Signs BP 130/86   Pulse 69   Temp (!) 97.5 F (36.4 C) (Oral)   Resp (!) 9   SpO2 97%   Physical Exam  Constitutional: He appears well-developed and well-nourished.  Patient appears uncomfortable is troubling on exam.  He is vomiting  during my interview.  No blood noted.  HENT:  Head: Normocephalic and atraumatic.  Eyes: Conjunctivae and EOM are normal. Pupils are equal, round, and reactive to light.  Neck: Normal range of motion. Neck supple.  Cardiovascular: Normal rate, regular rhythm and normal pulses.  No murmur heard. Pulmonary/Chest: Effort normal and breath sounds normal. No tachypnea. No respiratory distress. He has no decreased breath sounds. He has no wheezes. He has no rhonchi. He has no rales.  No increased work of breathing.  No adventitious breath sounds.  Patient is speaking in full sentences in no respiratory distress.  Abdominal: Soft. Bowel sounds are normal.  Mild epigastric tenderness with no guarding.  Belly is soft.  No distention.  No pulsatile abdominal mass.  Musculoskeletal: Normal range of motion. He exhibits no edema.  No calf TTP, erythema, swelling.  Neurological: He is alert.  Skin: Skin is warm and dry. Capillary refill takes less than 2 seconds.  Psychiatric: He has a normal mood and affect.  Nursing note and vitals reviewed.    ED Treatments / Results  Labs (all labs ordered are listed, but only abnormal results are displayed) Labs Reviewed  BASIC METABOLIC PANEL - Abnormal; Notable for the following components:      Result Value   CO2 21 (*)    Glucose, Bld 112 (*)    All other components within normal limits  CBC - Abnormal; Notable for the following components:   WBC 11.3 (*)    Hemoglobin 17.7 (*)    All other components within normal limits  HEPATIC FUNCTION PANEL - Abnormal; Notable for the following components:   AST 44 (*)    All other components within normal limits  LIPASE, BLOOD  D-DIMER, QUANTITATIVE (NOT AT [facility])  I-STAT TROPONIN, ED  I-STAT TROPONIN, ED    EKG  EKG Interpretation  Date/Time:  Wednesday October 03 2017 09:12:24 EST Ventricular Rate:  91 PR Interval:    QRS Duration: 89 QT Interval:  353 QTC Calculation: 435 R Axis:   91 Text  Interpretation:  Sinus rhythm Borderline right axis deviation Baseline wander in lead(s) II III aVF V2 besides wander, no significant change since Nov 2017 Confirmed by Pricilla Loveless [phone]) on 10/03/2017 10:17:18 AM Also confirmed by Pricilla Loveless [phone]), editor Barbette Hair [phone])  on 10/03/2017 11:28:32 AM       Radiology Dg Chest 2 View  Result Date: 10/03/2017 CLINICAL DATA:  Central chest pain abdominal pain EXAM: CHEST - 2 VIEW COMPARISON:  04/01/2017 FINDINGS: Normal mediastinum and cardiac silhouette. Normal pulmonary vasculature. No evidence of effusion, infiltrate, or pneumothorax. No acute bony abnormality. IMPRESSION: Normal chest radiograph. Electronically Signed   By: Genevive Bi M.D.   On: 10/03/2017 10:42    Procedures Procedures (including critical care time)  Medications Ordered in ED Medications  sodium chloride 0.9 % bolus 1,000 mL (0 mLs Intravenous Stopped 10/03/17 1228)  ondansetron (ZOFRAN) injection 4 mg (4 mg Intravenous Given 10/03/17 1055)  famotidine (PEPCID) IVPB 20 mg premix (0 mg Intravenous Stopped 10/03/17 1137)  haloperidol lactate (HALDOL) injection 5 mg (5 mg Intravenous Given 10/03/17 1141)     Initial Impression / Assessment and Plan / ED Course  I have reviewed the triage vital signs and the nursing notes.  Pertinent labs & imaging results that were available during my care of the patient were reviewed by me and considered in my medical decision making (see chart for details).    Discussed pt presentation and exam findings with Dr. Rayford Halsted, who agrees with the current workup.  Nursing informed me that patient would like to leave AMA prior to receiving a second troponin.  States he does not want to wait for the results of the troponin or urine.  Reevaluated patient and he is standing up in the room in NAD.  States his chest pain is improved, and he is no longer feeling short of breath.  States he still has some abdominal pain, but it is has also  improved. Repeat abd exam benign, nonsurgical abd. No guarding. Has been able to tolerate liquids.  States he has a ride coming in 30 minutes and that he needs to leave right now.  Explained the risks of leaving without completing second troponin including the risk of death due to heart attack or other etiology, patient states that he is very aware of the risks and he does not think he is having a heart attack and will come back if he needs to, but he needs to catch his ride so he would like to sign out AMA. Discussed other labwork with pt and plan for f/u and reasons to return. He agrees to return if worse.   Final Clinical Impressions(s) / ED Diagnoses   Final diagnoses:  Atypical chest pain   Patient presenting with chest pain and shortness of breath as well as nausea, vomiting, diarrhea.  Vital signs have been stable here.  Labs grossly reassuring with mild elevation in AST to 44 (advised to f/u as outpt about this).  CBC with mild leukocytosis to 11.3 which is improved from prior visits. Leukocytosis likely secondary to viral gastroenteritis. BMP grossly negative. Lipase negative. DDimer negative, and doubt PE. Initial trop negative. CXR negative. No evidence of PNA or PTX. Doubt AAA as no widened mediastinum. ECG normal with no ischemic changes. doubt acs given pts age and presentation, however was unable to repeat trop due to pt wanting to leave ama. Doubt emergent intraabdominal pathology given benign abd exam and reassuring labs and VS. Risks discussed and reasons to return discussed.  ED Discharge Orders    None       Karrie Meres, [place] 10/03/17 1850    Pricilla Loveless, MD 10/06/17 [phone]

## 2017-10-03 NOTE — Discharge Instructions (Signed)
you need to follow-up with your primary care doctor for reevaluation of your symptoms within 1 week.  If you do not have a primary care provider [facility] which you can follow-up with and establish primary care.  You need to return to the emergency department for any new or worsening symptoms including any chest pain, shortness of breath.

## 2017-10-03 NOTE — ED Notes (Signed)
Walked into pt room to collect urine and blood. Pt stated he wants to check out now. I attempted to explain to pt why we need to runs these additional tests. Pt threw his hand up and stated "I know, but Im ready to leave". I attempted to explain once more and pt stated the same thing after maybe getting out 3 or 4 words. I said ok left room and grabbed provider. Provider is in room with pt.

## 2017-10-03 NOTE — ED Notes (Signed)
Patient transported to X-ray 

## 2017-10-03 NOTE — ED Triage Notes (Signed)
He states he experienced central chest discomfort radiating into left arm. He states he vomited x 1 also. He is in no distress. EKG/labs performed at triage.

## 2017-10-03 NOTE — ED Notes (Signed)
Patient transported to Ultrasound 

## 2017-11-20 ENCOUNTER — Emergency Department (HOSPITAL_COMMUNITY)
Admission: EM | Admit: 2017-11-20 | Discharge: 2017-11-20 | Disposition: A | Discharge status: Home / Self Care | Payer: Self-pay | Attending: Emergency Medicine | Admitting: Emergency Medicine

## 2017-11-20 ENCOUNTER — Encounter (HOSPITAL_COMMUNITY): Payer: Self-pay | Admitting: Emergency Medicine

## 2017-11-20 ENCOUNTER — Emergency Department (HOSPITAL_COMMUNITY): Payer: Self-pay

## 2017-11-20 DIAGNOSIS — F17210 Nicotine dependence, cigarettes, uncomplicated: Secondary | ICD-10-CM | POA: Insufficient documentation

## 2017-11-20 DIAGNOSIS — R1084 Generalized abdominal pain: Secondary | ICD-10-CM | POA: Insufficient documentation

## 2017-11-20 DIAGNOSIS — Z79899 Other long term (current) drug therapy: Secondary | ICD-10-CM | POA: Insufficient documentation

## 2017-11-20 DIAGNOSIS — R197 Diarrhea, unspecified: Secondary | ICD-10-CM | POA: Insufficient documentation

## 2017-11-20 DIAGNOSIS — R112 Nausea with vomiting, unspecified: Secondary | ICD-10-CM | POA: Insufficient documentation

## 2017-11-20 LAB — COMPREHENSIVE METABOLIC PANEL
ALBUMIN: 4.7 g/dL (ref 3.5–5.0)
ALT: 52 U/L (ref 17–63)
ANION GAP: 15 (ref 5–15)
AST: 42 U/L — ABNORMAL HIGH (ref 15–41)
Alkaline Phosphatase: 48 U/L (ref 38–126)
BUN: 9 mg/dL (ref 6–20)
CO2: 21 mmol/L — AB (ref 22–32)
Calcium: 10 mg/dL (ref 8.9–10.3)
Chloride: 104 mmol/L (ref 101–111)
Creatinine, Ser: 0.83 mg/dL (ref 0.61–1.24)
GFR calc Af Amer: >60 mL/min (ref >60)
GFR calc non Af Amer: >60 mL/min (ref >60)
GLUCOSE: 123 mg/dL — AB (ref 65–99)
POTASSIUM: 4.1 mmol/L (ref 3.5–5.1)
Sodium: 140 mmol/L (ref 135–145)
TOTAL PROTEIN: 7.9 g/dL (ref 6.5–8.1)
Total Bilirubin: 1 mg/dL (ref 0.3–1.2)

## 2017-11-20 LAB — CBC WITH DIFFERENTIAL/PLATELET
Basophils Absolute: 0 10*3/uL (ref 0.0–0.1)
Basophils Relative: 0 %
EOS ABS: 0 10*3/uL (ref 0.0–0.7)
Eosinophils Relative: 0 %
HCT: 47.7 % (ref 39.0–52.0)
Hemoglobin: 17.3 g/dL — ABNORMAL HIGH (ref 13.0–17.0)
LYMPHS ABS: 1.5 10*3/uL (ref 0.7–4.0)
Lymphocytes Relative: 8 %
MCH: 33.4 pg (ref 26.0–34.0)
MCHC: 36.3 g/dL — AB (ref 30.0–36.0)
MCV: 92.1 fL (ref 78.0–100.0)
MONOS PCT: 5 %
Monocytes Absolute: 0.8 10*3/uL (ref 0.1–1.0)
Neutro Abs: 15.6 10*3/uL — ABNORMAL HIGH (ref 1.7–7.7)
Neutrophils Relative %: 87 %
Platelets: 363 10*3/uL (ref 150–400)
RBC: 5.18 MIL/uL (ref 4.22–5.81)
RDW: 12.9 % (ref 11.5–15.5)
WBC: 18 10*3/uL — AB (ref 4.0–10.5)

## 2017-11-20 LAB — URINALYSIS, ROUTINE W REFLEX MICROSCOPIC
Bilirubin Urine: NEGATIVE
GLUCOSE, UA: NEGATIVE mg/dL
Hgb urine dipstick: NEGATIVE
Ketones, ur: 20 mg/dL — AB
Leukocytes, UA: NEGATIVE
Nitrite: NEGATIVE
PH: 8 (ref 5.0–8.0)
PROTEIN: NEGATIVE mg/dL
Specific Gravity, Urine: 1.042 — ABNORMAL HIGH (ref 1.005–1.030)

## 2017-11-20 LAB — RAPID URINE DRUG SCREEN, HOSP PERFORMED
Amphetamines: NOT DETECTED
BENZODIAZEPINES: NOT DETECTED
Barbiturates: NOT DETECTED
Cocaine: NOT DETECTED
Opiates: POSITIVE — AB
Tetrahydrocannabinol: POSITIVE — AB

## 2017-11-20 LAB — LIPASE, BLOOD: Lipase: 29 U/L (ref 11–51)

## 2017-11-20 MED ORDER — IOPAMIDOL (ISOVUE-300) INJECTION 61%
100.0000 mL | Freq: Once | INTRAVENOUS | Status: AC | PRN
  Administered 2017-11-20: 100 mL via INTRAVENOUS

## 2017-11-20 MED ORDER — MORPHINE SULFATE (PF) 4 MG/ML IV SOLN
4.0000 mg | Freq: Once | INTRAVENOUS | Status: AC
  Administered 2017-11-20: 4 mg via INTRAVENOUS
  Filled 2017-11-20: qty 1

## 2017-11-20 MED ORDER — ONDANSETRON 4 MG PO TBDP: 4.0000 mg | ORAL_TABLET | Freq: Three times a day (TID) | ORAL | 0 refills | Status: DC | PRN

## 2017-11-20 MED ORDER — PROMETHAZINE HCL 25 MG/ML IJ SOLN
25.0000 mg | Freq: Once | INTRAMUSCULAR | Status: AC
  Administered 2017-11-20: 25 mg via INTRAVENOUS
  Filled 2017-11-20: qty 1

## 2017-11-20 MED ORDER — SODIUM CHLORIDE 0.9 % IV BOLUS
1000.0000 mL | Freq: Once | INTRAVENOUS | Status: AC
  Administered 2017-11-20: 1000 mL via INTRAVENOUS

## 2017-11-20 MED ORDER — IOPAMIDOL (ISOVUE-300) INJECTION 61%
INTRAVENOUS | Status: AC
  Administered 2017-11-20: 100 mL via INTRAVENOUS
  Filled 2017-11-20: qty 100

## 2017-11-20 NOTE — ED Notes (Signed)
Patient still unable to void at this time. Urinal at bedside. Patient has received 1 liter of fluids.

## 2017-11-20 NOTE — ED Triage Notes (Signed)
Per [t, states sharp abdominal pain that started this morning-states blood tinged vomit as well

## 2017-11-20 NOTE — ED Notes (Signed)
Patient aware we need urine sample. Patient can not void at this time. Urinal at bedside.

## 2017-11-20 NOTE — ED Provider Notes (Signed)
[facility]-EMERGENCY DEPT Provider Note   CSN: [id number] Arrival date & time: 11/20/17  7829     History   Chief Complaint Chief Complaint  Patient presents with  . Abdominal Pain    HPI Christopher Estes is a 32 y.o. male with a past medical history of migraines, hyperlipidemia, who presents today for evaluation of abdominal pain.  He reports that when he woke up this morning he had diffuse abdominal pain with associated nausea, vomiting, and diarrhea.  He reports that he has vomited "more times than I can count."  He reports that 1 of his episodes had red streaks in it.  He denies vomiting frank blood.  He reports that his diarrhea is nonbloody, not dark, tarry, or sticky.  He denies any recent trauma.  He reports that he has never had abdominal pain like this before.  He denies excess NSAID or aspirin use.  He reports that he normally drinks "a few" drinks every night however he has only had 1-2 drinks per night over the past few days.  He denies any history of abdominal surgeries.  HPI  Past Medical History:  Diagnosis Date  . Hyperlipidemia   . Insomnia   . Migraine     There are no active problems to display for this patient.   Past Surgical History:  Procedure Laterality Date  . INNER EAR SURGERY    . TONSILLECTOMY          Home Medications    Prior to Admission medications   Medication Sig Start Date End Date Taking? Authorizing Provider  PAPAYA PO Take 1 tablet by mouth daily.   Yes [provider]  benzonatate (TESSALON) 100 MG capsule Take 1 capsule (100 mg total) by mouth every 8 (eight) hours. Patient not taking: Reported on 11/20/2017 03/29/17   Janne Napoleon, NP  metoCLOPramide (REGLAN) 10 MG tablet Take 1 tablet (10 mg total) by mouth every 8 (eight) hours as needed for nausea or vomiting. Patient not taking: Reported on 11/20/2017 04/18/16   Azalia Bilis, MD  ondansetron (ZOFRAN ODT) 4 MG disintegrating tablet Take 1 tablet (4 mg  total) by mouth every 8 (eight) hours as needed for nausea or vomiting. 11/20/17   Cristina Gong, PA-C  ondansetron (ZOFRAN) 4 MG tablet Take 1 tablet (4 mg total) by mouth every 8 (eight) hours as needed for nausea or vomiting. Patient not taking: Reported on 11/20/2017 09/05/17   Pisciotta, Joni Reining, PA-C  promethazine (PHENERGAN) 12.5 MG tablet Take 1 tablet (12.5 mg total) by mouth every 6 (six) hours as needed for nausea or vomiting. Patient not taking: Reported on 11/20/2017 07/28/17   Raeford Razor, MD    Family History Family History  Problem Relation Age of Onset  . Diabetes Mother   . Hypertension Mother   . Asthma Mother   . Cancer Father   . Stroke Father   . Thyroid disease Father     Social History Social History   Tobacco Use  . Smoking status: Current Every Day Smoker    Packs/day: 1.00    Types: Cigarettes  . Smokeless tobacco: Never Used  Substance Use Topics  . Alcohol use: Yes    Alcohol/week: 14.4 oz    Types: 24 Cans of beer per week    Comment: weekly  . Drug use: Yes    Types: Marijuana     Allergies   Dilaudid [hydromorphone hcl]   Review of Systems Review of Systems  Constitutional:  Negative for chills and fever.  HENT: Negative for congestion and nosebleeds.   Respiratory: Negative for chest tightness and shortness of breath.   Gastrointestinal: Positive for abdominal pain, diarrhea, nausea and vomiting. Negative for blood in stool.  Genitourinary: Negative for decreased urine volume, dysuria, hematuria and urgency.  Musculoskeletal: Negative for arthralgias, myalgias and neck pain.  Skin: Negative for rash.  Neurological: Negative for light-headedness and headaches.  All other systems reviewed and are negative.    Physical Exam Updated Vital Signs BP 128/75 (BP Location: Left Arm)   Pulse 76   Temp 97.8 F (36.6 C) (Oral)   Resp 17   SpO2 97%   Physical Exam  Constitutional: He appears well-developed and well-nourished.    HENT:  Head: Normocephalic and atraumatic.  Mouth/Throat: Oropharynx is clear and moist.  Eyes: Conjunctivae are normal. No scleral icterus.  Neck: Neck supple.  Cardiovascular: Normal rate, regular rhythm and normal heart sounds.  No murmur heard. Pulmonary/Chest: Effort normal and breath sounds normal. No respiratory distress.  Abdominal: Soft. Normal appearance and bowel sounds are normal. There is generalized tenderness and tenderness in the right lower quadrant, periumbilical area and suprapubic area. There is rebound and positive Murphy's sign. There is no rigidity and no guarding.  Musculoskeletal: He exhibits no edema.  Neurological: He is alert.  Skin: Skin is warm and dry.  Psychiatric: He has a normal mood and affect.  Nursing note and vitals reviewed.    ED Treatments / Results  Labs (all labs ordered are listed, but only abnormal results are displayed) Labs Reviewed  COMPREHENSIVE METABOLIC PANEL - Abnormal; Notable for the following components:      Result Value   CO2 21 (*)    Glucose, Bld 123 (*)    AST 42 (*)    All other components within normal limits  URINALYSIS, ROUTINE W REFLEX MICROSCOPIC - Abnormal; Notable for the following components:   Specific Gravity, Urine 1.042 (*)    Ketones, ur 20 (*)    All other components within normal limits  RAPID URINE DRUG SCREEN, HOSP PERFORMED - Abnormal; Notable for the following components:   Opiates POSITIVE (*)    Tetrahydrocannabinol POSITIVE (*)    All other components within normal limits  CBC WITH DIFFERENTIAL/PLATELET - Abnormal; Notable for the following components:   WBC 18.0 (*)    Hemoglobin 17.3 (*)    MCHC 36.3 (*)    Neutro Abs 15.6 (*)    All other components within normal limits  LIPASE, BLOOD    EKG None  Radiology Ct Abdomen Pelvis W Contrast  Result Date: 11/20/2017 CLINICAL DATA:  Right-sided abdominal pain beginning this morning. Blood tinged vomit. EXAM: CT ABDOMEN AND PELVIS WITH  CONTRAST TECHNIQUE: Multidetector CT imaging of the abdomen and pelvis was performed using the standard protocol following bolus administration of intravenous contrast. CONTRAST:  ISOVUE-300 IOPAMIDOL (ISOVUE-300) INJECTION 61% COMPARISON:  Dec 24, 2014 FINDINGS: Lower chest: No acute abnormality. Hepatobiliary: Mild hepatic steatosis. No focal mass. The liver, gallbladder, and portal vein are otherwise normal. Pancreas: Unremarkable. No pancreatic ductal dilatation or surrounding inflammatory changes. Spleen: Normal in size without focal abnormality. Adrenals/Urinary Tract: Adrenal glands are unremarkable. Kidneys are normal, without renal calculi, focal lesion, or hydronephrosis. Bladder is unremarkable. Stomach/Bowel: The stomach and small bowel are normal. Evaluation of the colon is mildly limited due to poor distention. However, no evidence colitis or colonic abnormality identified. A few scattered colonic diverticuli are seen without diverticulitis. The appendix is  normal. Vascular/Lymphatic: The abdominal aorta is normal in caliber with no dissection. No atherosclerosis. No adenopathy. Reproductive: Prostate is unremarkable. Other: No abdominal wall hernia or abnormality. No abdominopelvic ascites. Musculoskeletal: No acute or significant osseous findings. IMPRESSION: 1. No cause for the patient's symptoms identified. 2. Mild hepatic steatosis. 3. There are scattered colonic diverticuli with no diverticulitis. 4. No other abnormalities. Electronically Signed   By: Gerome Samavid  Williams III M.D   On: 11/20/2017 14:00    Procedures Procedures (including critical care time)  Medications Ordered in ED Medications  sodium chloride 0.9 % bolus 1,000 mL (0 mLs Intravenous Stopped 11/20/17 1316)  promethazine (PHENERGAN) injection 25 mg (25 mg Intravenous Given 11/20/17 1226)  morphine 4 MG/ML injection 4 mg (4 mg Intravenous Given 11/20/17 1227)  iopamidol (ISOVUE-300) 61 % injection 100 mL (100 mLs  Intravenous Contrast Given 11/20/17 1327)  sodium chloride 0.9 % bolus 1,000 mL (0 mLs Intravenous Stopped 11/20/17 1547)     Initial Impression / Assessment and Plan / ED Course  I have reviewed the triage vital signs and the nursing notes.  Pertinent labs & imaging results that were available during my care of the patient were reviewed by me and considered in my medical decision making (see chart for details).    Patient is nontoxic, nonseptic appearing, in no apparent distress.  Patient's pain and other symptoms adequately managed in emergency department.  Fluid bolus given.  Labs, imaging and vitals reviewed.  Patient does not meet the SIRS or Sepsis criteria.  On repeat exam patient does not have a surgical abdomen and there are no peritoneal signs.  No indication of appendicitis, bowel obstruction, bowel perforation, cholecystitis, diverticulitis.  CAT scan does not reveal any cause for his symptoms today.  He does report that last night he was around a friend who smoke marijuana.  His UDS was positive for marijuana.  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  Patient discharged home with symptomatic treatment and given strict instructions for follow-up with their primary care physician.  I have also discussed reasons to return immediately to the ER.  Patient expresses understanding and agrees with plan.  Patient was instructed to continue rehydration at home, he states that he is going to get a bottle of Pedialyte on his way home today.  Patient did not have any episodes of vomiting or diarrhea while in the emergency room.  Final Clinical Impressions(s) / ED Diagnoses   Final diagnoses:  Generalized abdominal pain  Nausea, vomiting, and diarrhea    ED Discharge Orders        Ordered    ondansetron (ZOFRAN ODT) 4 MG disintegrating tablet  Every 8 hours PRN     11/20/17 1539       Cristina GongHammond, Cloyde Oregel  W, PA-C 11/20/17 1600    Lorre NickAllen, Anthony, MD 11/21/17 [phone]

## 2017-11-20 NOTE — Discharge Instructions (Signed)
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  This is a syndrome seen in people who use marijuana. It is associated with nausea, vomiting, abdominal pain, and generally not feeling well.  Often times patients will report that their symptoms can be relieved with a warm shower.  The best way to prevent this is to completely stop smoking/using all forms of marijuana.  Some people find symptom relief from applying capsaicin cream to their abdomen (belly).  Please be advised that capsaicin cream may cause a burning feeling.  Capsaicin cream may relieve some chest symptoms, however it will not prevent or fully resolve this condition. ° ° ° °

## 2018-07-13 ENCOUNTER — Other Ambulatory Visit: Payer: Self-pay

## 2018-07-13 ENCOUNTER — Emergency Department (HOSPITAL_COMMUNITY)
Admission: EM | Admit: 2018-07-13 | Discharge: 2018-07-13 | Disposition: A | Discharge status: Home / Self Care | Payer: Self-pay | Attending: Emergency Medicine | Admitting: Emergency Medicine

## 2018-07-13 ENCOUNTER — Encounter (HOSPITAL_COMMUNITY): Payer: Self-pay

## 2018-07-13 DIAGNOSIS — Z79899 Other long term (current) drug therapy: Secondary | ICD-10-CM | POA: Insufficient documentation

## 2018-07-13 DIAGNOSIS — F17210 Nicotine dependence, cigarettes, uncomplicated: Secondary | ICD-10-CM | POA: Insufficient documentation

## 2018-07-13 DIAGNOSIS — H6504 Acute serous otitis media, recurrent, right ear: Secondary | ICD-10-CM | POA: Insufficient documentation

## 2018-07-13 MED ORDER — ACETAMINOPHEN 500 MG PO TABS
1000.0000 mg | ORAL_TABLET | Freq: Once | ORAL | Status: AC
  Administered 2018-07-13: 1000 mg via ORAL
  Filled 2018-07-13: qty 2

## 2018-07-13 MED ORDER — AMOXICILLIN 500 MG PO CAPS: 500.0000 mg | ORAL_CAPSULE | Freq: Three times a day (TID) | ORAL | 0 refills | Status: DC

## 2018-07-13 MED ORDER — IBUPROFEN 200 MG PO TABS
600.0000 mg | ORAL_TABLET | Freq: Once | ORAL | Status: AC
  Administered 2018-07-13: 600 mg via ORAL
  Filled 2018-07-13: qty 3

## 2018-07-13 NOTE — ED Triage Notes (Signed)
Pt states he is having "very severe ear pain". He hasn't slept in two days because of this. +N, -dizziness, -blurred vision. Pt states he had a cold a couple weeks ago.

## 2018-07-13 NOTE — ED Provider Notes (Signed)
[facility]-EMERGENCY DEPT Provider Note   CSN: [id number] Arrival date & time: 07/13/18  0431     History   Chief Complaint Chief Complaint  Patient presents with  . Otalgia    HPI Christopher Estes is a 32 y.o. male.  HPI Patient is a 32 year old male reports 2 days of worsening right ear pain.  History of recurrent otitis media.  She denies fevers and chills.  Upper respiratory symptoms last week.  Symptoms are mild to moderate in severity.  Tried ibuprofen and Tylenol at home without improvement in his pain.  No other significant complaints at this time.   Past Medical History:  Diagnosis Date  . Hyperlipidemia   . Insomnia   . Migraine     There are no active problems to display for this patient.   Past Surgical History:  Procedure Laterality Date  . INNER EAR SURGERY    . TONSILLECTOMY          Home Medications    Prior to Admission medications   Medication Sig Start Date End Date Taking? Authorizing Provider  amoxicillin (AMOXIL) 500 MG capsule Take 1 capsule (500 mg total) by mouth 3 (three) times daily. 07/13/18   Azalia Bilisampos, Oaklan Persons, MD  benzonatate (TESSALON) 100 MG capsule Take 1 capsule (100 mg total) by mouth every 8 (eight) hours. Patient not taking: Reported on 11/20/2017 03/29/17   Janne NapoleonNeese, Hope M, NP  metoCLOPramide (REGLAN) 10 MG tablet Take 1 tablet (10 mg total) by mouth every 8 (eight) hours as needed for nausea or vomiting. Patient not taking: Reported on 11/20/2017 04/18/16   Azalia Bilisampos, Jahmar Mckelvy, MD  ondansetron (ZOFRAN ODT) 4 MG disintegrating tablet Take 1 tablet (4 mg total) by mouth every 8 (eight) hours as needed for nausea or vomiting. 11/20/17   Cristina GongHammond, Elizabeth W, PA-C  ondansetron (ZOFRAN) 4 MG tablet Take 1 tablet (4 mg total) by mouth every 8 (eight) hours as needed for nausea or vomiting. Patient not taking: Reported on 11/20/2017 09/05/17   Pisciotta, Joni ReiningNicole, PA-C  PAPAYA PO Take 1 tablet by mouth daily.    [provider]   promethazine (PHENERGAN) 12.5 MG tablet Take 1 tablet (12.5 mg total) by mouth every 6 (six) hours as needed for nausea or vomiting. Patient not taking: Reported on 11/20/2017 07/28/17   Raeford RazorKohut, Stephen, MD    Family History Family History  Problem Relation Age of Onset  . Diabetes Mother   . Hypertension Mother   . Asthma Mother   . Cancer Father   . Stroke Father   . Thyroid disease Father     Social History Social History   Tobacco Use  . Smoking status: Current Every Day Smoker    Packs/day: 1.00    Types: Cigarettes  . Smokeless tobacco: Never Used  Substance Use Topics  . Alcohol use: Yes    Alcohol/week: 24.0 standard drinks    Types: 24 Cans of beer per week    Comment: weekly  . Drug use: Yes    Types: Marijuana     Allergies   Dilaudid [hydromorphone hcl]   Review of Systems Review of Systems  All other systems reviewed and are negative.    Physical Exam Updated Vital Signs BP (!) 165/114 (BP Location: Left Arm)   Pulse 67   Temp (!) 97.5 F (36.4 C) (Oral)   Resp 18   Ht 5\' 11"  (1.803 m)   Wt 88.5 kg   SpO2 100%   BMI 27.20 kg/m  Physical Exam Vitals signs and nursing note reviewed.  Constitutional:      Appearance: He is well-developed.  HENT:     Head: Normocephalic.     Comments: Left TM occluded by cerumen.  Erythema of the right TM without perforation or drainage.    Mouth/Throat:     Mouth: Mucous membranes are moist.     Pharynx: Oropharynx is clear.  Neck:     Musculoskeletal: Normal range of motion.  Pulmonary:     Effort: Pulmonary effort is normal.  Abdominal:     General: There is no distension.  Musculoskeletal: Normal range of motion.  Neurological:     Mental Status: He is alert and oriented to person, place, and time.      ED Treatments / Results  Labs (all labs ordered are listed, but only abnormal results are displayed) Labs Reviewed - No data to display  EKG None  Radiology No results  found.  Procedures Procedures (including critical care time)  Medications Ordered in ED Medications  ibuprofen (ADVIL,MOTRIN) tablet 600 mg (has no administration in time range)  acetaminophen (TYLENOL) tablet 1,000 mg (has no administration in time range)     Initial Impression / Assessment and Plan / ED Course  I have reviewed the triage vital signs and the nursing notes.  Pertinent labs & imaging results that were available during my care of the patient were reviewed by me and considered in my medical decision making (see chart for details).     Acute otitis media of the right ear.  Ibuprofen Tylenol for pain.  Amoxicillin.  Primary care follow-up. Final Clinical Impressions(s) / ED Diagnoses   Final diagnoses:  Recurrent acute serous otitis media of right ear    ED Discharge Orders         Ordered    amoxicillin (AMOXIL) 500 MG capsule  3 times daily     07/13/18 0810           Azalia Bilis, MD 07/13/18 [phone]

## 2018-07-15 ENCOUNTER — Emergency Department (HOSPITAL_COMMUNITY)
Admission: EM | Admit: 2018-07-15 | Discharge: 2018-07-15 | Disposition: A | Discharge status: Home / Self Care | Payer: Self-pay | Attending: Emergency Medicine | Admitting: Emergency Medicine

## 2018-07-15 ENCOUNTER — Encounter (HOSPITAL_COMMUNITY): Payer: Self-pay

## 2018-07-15 DIAGNOSIS — H66004 Acute suppurative otitis media without spontaneous rupture of ear drum, recurrent, right ear: Secondary | ICD-10-CM | POA: Insufficient documentation

## 2018-07-15 DIAGNOSIS — F17210 Nicotine dependence, cigarettes, uncomplicated: Secondary | ICD-10-CM | POA: Insufficient documentation

## 2018-07-15 DIAGNOSIS — Z79899 Other long term (current) drug therapy: Secondary | ICD-10-CM | POA: Insufficient documentation

## 2018-07-15 MED ORDER — KETOROLAC TROMETHAMINE 60 MG/2ML IM SOLN
60.0000 mg | Freq: Once | INTRAMUSCULAR | Status: AC
  Administered 2018-07-15: 60 mg via INTRAMUSCULAR
  Filled 2018-07-15: qty 2

## 2018-07-15 MED ORDER — STERILE WATER FOR INJECTION IJ SOLN
INTRAMUSCULAR | Status: AC
  Administered 2018-07-15: 2.1 mL
  Filled 2018-07-15: qty 10

## 2018-07-15 MED ORDER — CEFTRIAXONE SODIUM 1 G IJ SOLR
1.0000 g | Freq: Once | INTRAMUSCULAR | Status: AC
  Administered 2018-07-15: 1 g via INTRAMUSCULAR
  Filled 2018-07-15: qty 10

## 2018-07-15 MED ORDER — TRAMADOL HCL 50 MG PO TABS: 50.0000 mg | ORAL_TABLET | Freq: Four times a day (QID) | ORAL | 0 refills | Status: DC | PRN

## 2018-07-15 NOTE — ED Triage Notes (Signed)
Pt seen two days ago with right ear pain, pt states he is taking his prescription. Pt now in ED stating that his neck is stiff now as well and increased pain. Reports nausea and vomiting.

## 2018-07-15 NOTE — ED Provider Notes (Signed)
[facility]-EMERGENCY DEPT Provider Note   CSN: [id number] Arrival date & time: 07/15/18  0350     History   Chief Complaint Chief Complaint  Patient presents with  . Otalgia    HPI Christopher Estes is a 32 y.o. male.  Patient presents to the emergency department for evaluation of right ear pain.  Patient reports that he was in the ER 2 days ago for right ear pain, diagnosed with ear infection.  Patient reports that the pain has worsened.  He is now complaining of a headache and he has had nausea and vomiting.     Past Medical History:  Diagnosis Date  . Hyperlipidemia   . Insomnia   . Migraine     There are no active problems to display for this patient.   Past Surgical History:  Procedure Laterality Date  . INNER EAR SURGERY    . TONSILLECTOMY          Home Medications    Prior to Admission medications   Medication Sig Start Date End Date Taking? Authorizing Provider  amoxicillin (AMOXIL) 500 MG capsule Take 1 capsule (500 mg total) by mouth 3 (three) times daily. 07/13/18   Azalia Bilis, MD  benzonatate (TESSALON) 100 MG capsule Take 1 capsule (100 mg total) by mouth every 8 (eight) hours. Patient not taking: Reported on 11/20/2017 03/29/17   Janne Napoleon, NP  metoCLOPramide (REGLAN) 10 MG tablet Take 1 tablet (10 mg total) by mouth every 8 (eight) hours as needed for nausea or vomiting. Patient not taking: Reported on 11/20/2017 04/18/16   Azalia Bilis, MD  ondansetron (ZOFRAN ODT) 4 MG disintegrating tablet Take 1 tablet (4 mg total) by mouth every 8 (eight) hours as needed for nausea or vomiting. 11/20/17   Cristina Gong, PA-C  ondansetron (ZOFRAN) 4 MG tablet Take 1 tablet (4 mg total) by mouth every 8 (eight) hours as needed for nausea or vomiting. Patient not taking: Reported on 11/20/2017 09/05/17   Pisciotta, Joni Reining, PA-C  PAPAYA PO Take 1 tablet by mouth daily.    [provider]  promethazine (PHENERGAN) 12.5 MG tablet  Take 1 tablet (12.5 mg total) by mouth every 6 (six) hours as needed for nausea or vomiting. Patient not taking: Reported on 11/20/2017 07/28/17   Raeford Razor, MD  traMADol (ULTRAM) 50 MG tablet Take 1 tablet (50 mg total) by mouth every 6 (six) hours as needed. 07/15/18   Gilda Crease, MD    Family History Family History  Problem Relation Age of Onset  . Diabetes Mother   . Hypertension Mother   . Asthma Mother   . Cancer Father   . Stroke Father   . Thyroid disease Father     Social History Social History   Tobacco Use  . Smoking status: Current Every Day Smoker    Packs/day: 1.00    Types: Cigarettes  . Smokeless tobacco: Never Used  Substance Use Topics  . Alcohol use: Yes    Alcohol/week: 24.0 standard drinks    Types: 24 Cans of beer per week    Comment: weekly  . Drug use: Yes    Types: Marijuana     Allergies   Dilaudid [hydromorphone hcl]   Review of Systems Review of Systems  HENT: Positive for ear pain.   Gastrointestinal: Positive for nausea and vomiting.  Neurological: Positive for headaches.  All other systems reviewed and are negative.    Physical Exam Updated Vital Signs BP Marland Kitchen)  142/110 (BP Location: Right Arm)   Pulse 80   Temp 97.8 F (36.6 C) (Oral)   Resp 16   Ht 5\' 11"  (1.803 m)   Wt 88 kg   SpO2 99%   BMI 27.06 kg/m   Physical Exam Vitals signs and nursing note reviewed.  Constitutional:      General: He is not in acute distress.    Appearance: Normal appearance. He is well-developed.  HENT:     Head: Normocephalic and atraumatic.     Right Ear: Hearing normal. Tympanic membrane is erythematous. Tympanic membrane is not perforated.     Left Ear: Hearing normal.     Ears:     Comments: Left external canal occluded by cerumen, cannot see tympanic membrane  Right tympanic membrane still erythematous, no rupture    Nose: Nose normal.  Eyes:     Conjunctiva/sclera: Conjunctivae normal.     Pupils: Pupils are  equal, round, and reactive to light.  Neck:     Musculoskeletal: Normal range of motion and neck supple.     Meningeal: Brudzinski's sign and Kernig's sign present.  Cardiovascular:     Rate and Rhythm: Regular rhythm.     Heart sounds: S1 normal and S2 normal. No murmur. No friction rub. No gallop.   Pulmonary:     Effort: Pulmonary effort is normal. No respiratory distress.     Breath sounds: Normal breath sounds.  Chest:     Chest wall: No tenderness.  Abdominal:     General: Bowel sounds are normal.     Palpations: Abdomen is soft.     Tenderness: There is no abdominal tenderness. There is no guarding or rebound. Negative signs include Murphy's sign and McBurney's sign.     Hernia: No hernia is present.  Musculoskeletal: Normal range of motion.  Skin:    General: Skin is warm and dry.     Findings: No rash.  Neurological:     Mental Status: He is alert and oriented to person, place, and time.     GCS: GCS eye subscore is 4. GCS verbal subscore is 5. GCS motor subscore is 6.     Cranial Nerves: No cranial nerve deficit.     Sensory: No sensory deficit.     Coordination: Coordination normal.  Psychiatric:        Speech: Speech normal.        Behavior: Behavior normal.        Thought Content: Thought content normal.      ED Treatments / Results  Labs (all labs ordered are listed, but only abnormal results are displayed) Labs Reviewed - No data to display  EKG None  Radiology No results found.  Procedures Procedures (including critical care time)  Medications Ordered in ED Medications  cefTRIAXone (ROCEPHIN) injection 1 g (has no administration in time range)  ketorolac (TORADOL) injection 60 mg (has no administration in time range)     Initial Impression / Assessment and Plan / ED Course  I have reviewed the triage vital signs and the nursing notes.  Pertinent labs & imaging results that were available during my care of the patient were reviewed by me and  considered in my medical decision making (see chart for details).     Patient returns for continued right ear pain.  Patient diagnosed with otitis media 2 days ago.  Reports no improvement.  He still has some erythema of the eardrum, however no longer bulging.  Suspect that it has  somewhat improved.  Remainder of his examination unremarkable.  Final Clinical Impressions(s) / ED Diagnoses   Final diagnoses:  Recurrent acute suppurative otitis media of right ear without spontaneous rupture of tympanic membrane    ED Discharge Orders         Ordered    traMADol (ULTRAM) 50 MG tablet  Every 6 hours PRN     07/15/18 0402           Gilda CreasePollina, Tyrus Wilms J, MD 07/15/18 [phone]

## 2018-07-15 NOTE — ED Notes (Signed)
Bed: WA17 Expected date:  Expected time:  Means of arrival:  Comments: 32 yo M/ear pain

## 2019-07-12 ENCOUNTER — Other Ambulatory Visit: Payer: Self-pay

## 2019-07-12 DIAGNOSIS — Z20822 Contact with and (suspected) exposure to covid-19: Secondary | ICD-10-CM

## 2019-07-13 LAB — NOVEL CORONAVIRUS, NAA: SARS-CoV-2, NAA: NOT DETECTED

## 2019-09-16 ENCOUNTER — Encounter (HOSPITAL_COMMUNITY): Payer: Self-pay

## 2019-09-16 ENCOUNTER — Ambulatory Visit (INDEPENDENT_AMBULATORY_CARE_PROVIDER_SITE_OTHER): Payer: Self-pay

## 2019-09-16 ENCOUNTER — Other Ambulatory Visit: Payer: Self-pay

## 2019-09-16 ENCOUNTER — Ambulatory Visit (HOSPITAL_COMMUNITY)
Admission: EM | Admit: 2019-09-16 | Discharge: 2019-09-16 | Disposition: A | Discharge status: Home / Self Care | Payer: Self-pay | Attending: Family Medicine | Admitting: Family Medicine

## 2019-09-16 DIAGNOSIS — S60221A Contusion of right hand, initial encounter: Secondary | ICD-10-CM

## 2019-09-16 NOTE — ED Provider Notes (Signed)
[facility]    CSN: [id number] Arrival date & time: 09/16/19  1458      History   Chief Complaint Chief Complaint  Patient presents with  . Hand Injury    HPI Christopher Estes is a 34 y.o. male.   HPI  Patient states he punched a wall on Saturday.  He has been having pain ever since.  Today is Tuesday and he had difficulty at work.  He works as a Administrator.  He is here for evaluation.  Pain is in his right hand, ring and little finger knuckles.  Past Medical History:  Diagnosis Date  . Hyperlipidemia   . Insomnia   . Migraine     There are no problems to display for this patient.   Past Surgical History:  Procedure Laterality Date  . INNER EAR SURGERY    . TONSILLECTOMY         Home Medications    Prior to Admission medications   Medication Sig Start Date End Date Taking? Authorizing Provider  metoCLOPramide (REGLAN) 10 MG tablet Take 1 tablet (10 mg total) by mouth every 8 (eight) hours as needed for nausea or vomiting. Patient not taking: Reported on 11/20/2017 04/18/16 09/16/19  Azalia Bilis, MD  promethazine (PHENERGAN) 12.5 MG tablet Take 1 tablet (12.5 mg total) by mouth every 6 (six) hours as needed for nausea or vomiting. Patient not taking: Reported on 11/20/2017 07/28/17 09/16/19  Raeford Razor, MD  At the time of his visit, patient is on no medications  Family History Family History  Problem Relation Age of Onset  . Diabetes Mother   . Hypertension Mother   . Asthma Mother   . Cancer Father   . Stroke Father   . Thyroid disease Father     Social History Social History   Tobacco Use  . Smoking status: Current Every Day Smoker    Packs/day: 1.00    Types: Cigarettes  . Smokeless tobacco: Never Used  Substance Use Topics  . Alcohol use: Yes    Alcohol/week: 24.0 standard drinks    Types: 24 Cans of beer per week    Comment: weekly  . Drug use: Yes    Types: Marijuana     Allergies   Dilaudid [hydromorphone hcl]   Review  of Systems Review of Systems  Musculoskeletal: Positive for arthralgias.       Hand pain right     Physical Exam Triage Vital Signs ED Triage Vitals  Enc Vitals Group     BP 09/16/19 1604 (!) 151/71     Pulse Rate 09/16/19 1604 70     Resp 09/16/19 1604 16     Temp 09/16/19 1604 98.6 F (37 C)     Temp Source 09/16/19 1604 Oral     SpO2 09/16/19 1604 100 %     Weight 09/16/19 1607 198 lb (89.8 kg)     Height --      Head Circumference --      Peak Flow --      Pain Score 09/16/19 1607 8     Pain Loc --      Pain Edu? --      Excl. in GC? --    No data found.  Updated Vital Signs BP (!) 151/71 (BP Location: Left Arm)   Pulse 70   Temp 98.6 F (37 C) (Oral)   Resp 16   Wt 89.8 kg   SpO2 100%   BMI 27.62 kg/m  Physical Exam Constitutional:      General: He is not in acute distress.    Appearance: Normal appearance. He is well-developed.     Comments: Exam by observation  HENT:     Head: Normocephalic and atraumatic.     Mouth/Throat:     Comments: Mask in place Eyes:     Conjunctiva/sclera: Conjunctivae normal.     Pupils: Pupils are equal, round, and reactive to light.  Cardiovascular:     Rate and Rhythm: Normal rate.  Pulmonary:     Effort: Pulmonary effort is normal. No respiratory distress.  Musculoskeletal:        General: Normal range of motion.     Cervical back: Normal range of motion.     Comments: Right hand has swelling and ecchymosis over the fourth and fifth MCP joints.  Limited flexion and extension secondary to pain.  No rotational defect.  No deformity noted.  Tenderness over the distal metacarpals 4 and 5  Skin:    General: Skin is warm and dry.  Neurological:     General: No focal deficit present.     Mental Status: He is alert.     Sensory: No sensory deficit.  Psychiatric:        Mood and Affect: Mood normal.        Behavior: Behavior normal.      UC Treatments / Results  Labs (all labs ordered are listed, but only  abnormal results are displayed) Labs Reviewed - No data to display  EKG   Radiology DG Hand Complete Right  Result Date: 09/16/2019 CLINICAL DATA:  Punched a wall with hand pain EXAM: RIGHT HAND - COMPLETE 3+ VIEW COMPARISON:  None. FINDINGS: There is no evidence of fracture or dislocation. There is no evidence of arthropathy or other focal bone abnormality. Soft tissues are unremarkable. IMPRESSION: Negative. Electronically Signed   By: Donavan Foil M.D.   On: 09/16/2019 16:41    Procedures Procedures (including critical care time)  Medications Ordered in UC Medications - No data to display  Initial Impression / Assessment and Plan / UC Course  I have reviewed the triage vital signs and the nursing notes.  Pertinent labs & imaging results that were available during my care of the patient were reviewed by me and considered in my medical decision making (see chart for details).     Told patient no fracture Avoid heavy use OTC medications RTC as needed Final Clinical Impressions(s) / UC Diagnoses   Final diagnoses:  Contusion of right hand, initial encounter     Discharge Instructions     No fracture Use ice and elevation for the pain Ibuprofen 600 mg every 8 hours for pain Return as needed    ED Prescriptions    None     PDMP not reviewed this encounter.   Raylene Everts, MD 09/16/19 [phone]

## 2019-09-16 NOTE — ED Triage Notes (Addendum)
Pt state he punched a wall Saturday because he was mad. Pt has right hand pain.

## 2019-09-16 NOTE — Discharge Instructions (Addendum)
No fracture Use ice and elevation for the pain Ibuprofen 600 mg every 8 hours for pain Return as needed

## 2019-12-18 ENCOUNTER — Emergency Department (HOSPITAL_COMMUNITY)
Admission: EM | Admit: 2019-12-18 | Discharge: 2019-12-18 | Disposition: A | Discharge status: Home / Self Care | Payer: Self-pay | Attending: Emergency Medicine | Admitting: Emergency Medicine

## 2019-12-18 ENCOUNTER — Encounter (HOSPITAL_COMMUNITY): Payer: Self-pay | Admitting: Emergency Medicine

## 2019-12-18 ENCOUNTER — Other Ambulatory Visit: Payer: Self-pay

## 2019-12-18 DIAGNOSIS — R112 Nausea with vomiting, unspecified: Secondary | ICD-10-CM | POA: Insufficient documentation

## 2019-12-18 DIAGNOSIS — K625 Hemorrhage of anus and rectum: Secondary | ICD-10-CM | POA: Insufficient documentation

## 2019-12-18 DIAGNOSIS — R197 Diarrhea, unspecified: Secondary | ICD-10-CM | POA: Insufficient documentation

## 2019-12-18 DIAGNOSIS — F17210 Nicotine dependence, cigarettes, uncomplicated: Secondary | ICD-10-CM | POA: Insufficient documentation

## 2019-12-18 LAB — COMPREHENSIVE METABOLIC PANEL
ALT: 42 U/L (ref 0–44)
AST: 31 U/L (ref 15–41)
Albumin: 4.4 g/dL (ref 3.5–5.0)
Alkaline Phosphatase: 57 U/L (ref 38–126)
Anion gap: 8 (ref 5–15)
BUN: 10 mg/dL (ref 6–20)
CO2: 24 mmol/L (ref 22–32)
Calcium: 8.9 mg/dL (ref 8.9–10.3)
Chloride: 106 mmol/L (ref 98–111)
Creatinine, Ser: 0.75 mg/dL (ref 0.61–1.24)
GFR calc Af Amer: >60 mL/min (ref >60)
GFR calc non Af Amer: >60 mL/min (ref >60)
Glucose, Bld: 100 mg/dL — ABNORMAL HIGH (ref 70–99)
Potassium: 3.9 mmol/L (ref 3.5–5.1)
Sodium: 138 mmol/L (ref 135–145)
Total Bilirubin: 0.9 mg/dL (ref 0.3–1.2)
Total Protein: 7.5 g/dL (ref 6.5–8.1)

## 2019-12-18 LAB — CBC
HCT: 49 % (ref 39.0–52.0)
Hemoglobin: 17 g/dL (ref 13.0–17.0)
MCH: 33.1 pg (ref 26.0–34.0)
MCHC: 34.7 g/dL (ref 30.0–36.0)
MCV: 95.3 fL (ref 80.0–100.0)
Platelets: 362 10*3/uL (ref 150–400)
RBC: 5.14 MIL/uL (ref 4.22–5.81)
RDW: 13.2 % (ref 11.5–15.5)
WBC: 7.8 10*3/uL (ref 4.0–10.5)
nRBC: 0 % (ref 0.0–0.2)

## 2019-12-18 LAB — LIPASE, BLOOD: Lipase: 26 U/L (ref 11–51)

## 2019-12-18 LAB — TYPE AND SCREEN
ABO/RH(D): O POS
Antibody Screen: NEGATIVE

## 2019-12-18 LAB — ABO/RH: ABO/RH(D): O POS

## 2019-12-18 MED ORDER — PANTOPRAZOLE SODIUM 40 MG IV SOLR
40.0000 mg | Freq: Once | INTRAVENOUS | Status: AC
  Administered 2019-12-18: 40 mg via INTRAVENOUS
  Filled 2019-12-18: qty 40

## 2019-12-18 MED ORDER — SUCRALFATE 1 G PO TABS: 1.0000 g | ORAL_TABLET | Freq: Three times a day (TID) | ORAL | 0 refills | Status: DC

## 2019-12-18 MED ORDER — ALUM & MAG HYDROXIDE-SIMETH 200-200-20 MG/5ML PO SUSP
30.0000 mL | Freq: Once | ORAL | Status: AC
  Administered 2019-12-18: 30 mL via ORAL
  Filled 2019-12-18: qty 30

## 2019-12-18 MED ORDER — ONDANSETRON HCL 4 MG/2ML IJ SOLN
4.0000 mg | Freq: Once | INTRAMUSCULAR | Status: AC
  Administered 2019-12-18: 4 mg via INTRAVENOUS
  Filled 2019-12-18: qty 2

## 2019-12-18 MED ORDER — LIDOCAINE VISCOUS HCL 2 % MT SOLN
15.0000 mL | Freq: Once | OROMUCOSAL | Status: AC
  Administered 2019-12-18: 15 mL via ORAL
  Filled 2019-12-18: qty 15

## 2019-12-18 MED ORDER — SODIUM CHLORIDE 0.9 % IV BOLUS
1000.0000 mL | Freq: Once | INTRAVENOUS | Status: AC
  Administered 2019-12-18: 1000 mL via INTRAVENOUS

## 2019-12-18 MED ORDER — ONDANSETRON HCL 4 MG PO TABS: 4.0000 mg | ORAL_TABLET | Freq: Four times a day (QID) | ORAL | 0 refills | Status: DC

## 2019-12-18 MED ORDER — SODIUM CHLORIDE 0.9% FLUSH
3.0000 mL | Freq: Once | INTRAVENOUS | Status: AC
  Administered 2019-12-18: 3 mL via INTRAVENOUS

## 2019-12-18 NOTE — ED Triage Notes (Signed)
Pt having abd pains with nausea x week. Reports rectal bleeding yesterday, vomited blood today. Hx diverticulitis.

## 2019-12-18 NOTE — ED Notes (Signed)
Patient made aware UA is needed. Urinal at bedside.

## 2019-12-18 NOTE — ED Provider Notes (Signed)
[facility]-EMERGENCY DEPT Provider Note   CSN: [id number] Arrival date & time: 12/18/19  5366     History Chief Complaint  Patient presents with  . Abdominal Pain  . Hematemesis  . Rectal Bleeding    Christopher Estes is a 34 y.o. male.  HPI Patient presents to the emergency department with chief complaint of nausea and abdominal pain that started 1 week ago.  Patient scribes the abdominal pain as epigastric, does not radiate and is constant.  Denies any alleviating or aggravating factors.  Patient explains that he has a history of diverticulitis and has been stressed over the last few months.  He admits that he has had several episodes of vomiting and one incident of some blood in his vomit this morning.  He also admits that he has had frequent diarrhea and one episode of bloody diarrhea that happened yesterday.  Since that one episode of bloody diarrhea he has had bowel movements without blood noted in it.  He denies seeing dark tarry stools.  Patient notes that he frequently drinks 3 mixed drinks and has a couple of beers a day and that he has had uncontrolled acid reflux for the last week.  He has tried taking antiacids which has not been helping him.  He denies fever, chills, chest pain, shortness of breath, difficulty breathing, pedal edema.    Past Medical History:  Diagnosis Date  . Hyperlipidemia   . Insomnia   . Migraine     There are no problems to display for this patient.   Past Surgical History:  Procedure Laterality Date  . INNER EAR SURGERY    . TONSILLECTOMY         Family History  Problem Relation Age of Onset  . Diabetes Mother   . Hypertension Mother   . Asthma Mother   . Cancer Father   . Stroke Father   . Thyroid disease Father     Social History   Tobacco Use  . Smoking status: Current Every Day Smoker    Packs/day: 1.00    Types: Cigarettes  . Smokeless tobacco: Never Used  Substance Use Topics  . Alcohol use: Yes   Alcohol/week: 24.0 standard drinks    Types: 24 Cans of beer per week    Comment: weekly  . Drug use: Yes    Types: Marijuana    Home Medications Prior to Admission medications   Medication Sig Start Date End Date Taking? Authorizing Provider  ondansetron (ZOFRAN) 4 MG tablet Take 1 tablet (4 mg total) by mouth every 6 (six) hours. 12/18/19   Carroll Sage, PA-C  sucralfate (CARAFATE) 1 g tablet Take 1 tablet (1 g total) by mouth 4 (four) times daily -  with meals and at bedtime for 10 days. 12/18/19 12/28/19  Carroll Sage, PA-C  metoCLOPramide (REGLAN) 10 MG tablet Take 1 tablet (10 mg total) by mouth every 8 (eight) hours as needed for nausea or vomiting. Patient not taking: Reported on 11/20/2017 04/18/16 09/16/19  Azalia Bilis, MD  promethazine (PHENERGAN) 12.5 MG tablet Take 1 tablet (12.5 mg total) by mouth every 6 (six) hours as needed for nausea or vomiting. Patient not taking: Reported on 11/20/2017 07/28/17 09/16/19  Raeford Razor, MD    Allergies    Dilaudid [hydromorphone hcl]  Review of Systems   Review of Systems  Constitutional: Negative for chills, diaphoresis and fever.  HENT: Negative for congestion and sore throat.   Respiratory: Negative for cough and shortness  of breath.   Cardiovascular: Negative for chest pain and leg swelling.  Gastrointestinal: Positive for abdominal pain, blood in stool, diarrhea, nausea and vomiting. Negative for constipation and rectal pain.  Genitourinary: Negative for dysuria, enuresis and flank pain.  Musculoskeletal: Negative for back pain.  Skin: Negative for rash.  Neurological: Negative for dizziness, light-headedness and headaches.  Hematological: Does not bruise/bleed easily.    Physical Exam Updated Vital Signs BP (!) 131/97   Pulse (!) 57   Temp 98.3 F (36.8 C)   Resp 18   SpO2 96%   Physical Exam Vitals and nursing note reviewed. Exam conducted with a chaperone present.  Constitutional:      General: He is  not in acute distress.    Appearance: He is not ill-appearing.  HENT:     Head: Normocephalic and atraumatic.     Nose: No congestion.     Mouth/Throat:     Mouth: Mucous membranes are moist.     Pharynx: Oropharynx is clear.  Eyes:     General: No scleral icterus. Cardiovascular:     Rate and Rhythm: Normal rate and regular rhythm.     Pulses: Normal pulses.     Heart sounds: No murmur. No friction rub. No gallop.   Pulmonary:     Effort: No respiratory distress.     Breath sounds: No wheezing, rhonchi or rales.  Abdominal:     General: Bowel sounds are normal. There is no distension.     Palpations: Abdomen is soft.     Tenderness: There is abdominal tenderness in the epigastric area. There is no guarding or rebound. Negative signs include Rovsing's sign and McBurney's sign.     Hernia: No hernia is present.  Genitourinary:    Comments: A rectal exam was performed, there was no noted external hemorrhoids or blood seen around the rectum. There was no internal hemorrhoids felt or blood visualized after exam.  Hemoccult came back negative Musculoskeletal:        General: No swelling.  Skin:    General: Skin is warm and dry.     Findings: No rash.  Neurological:     Mental Status: He is alert.  Psychiatric:        Mood and Affect: Mood normal.     ED Results / Procedures / Treatments   Labs (all labs ordered are listed, but only abnormal results are displayed) Labs Reviewed  COMPREHENSIVE METABOLIC PANEL - Abnormal; Notable for the following components:      Result Value   Glucose, Bld 100 (*)    All other components within normal limits  LIPASE, BLOOD  CBC  URINALYSIS, ROUTINE W REFLEX MICROSCOPIC  POC OCCULT BLOOD, ED  TYPE AND SCREEN  ABO/RH    EKG None  Radiology No results found.  Procedures Procedures (including critical care time)  Medications Ordered in ED Medications  sodium chloride flush (NS) 0.9 % injection 3 mL (3 mLs Intravenous Given 12/18/19  0824)  sodium chloride 0.9 % bolus 1,000 mL (0 mLs Intravenous Stopped 12/18/19 1009)  ondansetron (ZOFRAN) injection 4 mg (4 mg Intravenous Given 12/18/19 0830)  pantoprazole (PROTONIX) injection 40 mg (40 mg Intravenous Given 12/18/19 0831)  alum & mag hydroxide-simeth (MAALOX/MYLANTA) 200-200-20 MG/5ML suspension 30 mL (30 mLs Oral Given 12/18/19 1007)    And  lidocaine (XYLOCAINE) 2 % viscous mouth solution 15 mL (15 mLs Oral Given 12/18/19 1007)    ED Course  I have reviewed the triage vital signs and  the nursing notes.  Pertinent labs & imaging results that were available during my care of the patient were reviewed by me and considered in my medical decision making (see chart for details).  Clinical Course as of Dec 17 1224  Thu Dec 18, 2019  0903 Patient was reassessed after given Zofran for nausea which she states has helped.  Still complains of epigastric pain.  Patient CBC does not show anemia, or infection.  Patient's CMP did not show any electrolyte abnormalities or AKI.  Patient had a Hemoccult done and it was negative for blood.  There was no external hemorrhoids noted or internal hemorrhoids felt.   [WF]    Clinical Course User Index [WF] Marcello Fennel, PA-C   MDM Rules/Calculators/A&P                     Patient does not appear to be in any acute distress, and is resting comfortably in bed and not having difficulty breathing.  Patient's vitals have remained stable, he is afebrile, nontachycardic, has a blood pressure of 141/86 and is satting at 98% on room air.  Patient does not have peritoneal sign or show signs of a acute abdomen.  Patient states his epigastric  pain and nausea has improved after given a GI cocktail.  I have low suspicion that the patient's abdominal pain is from diverticulitis as the pain is more epigastric instead of lower abdomen and improved on a GI cocktail.  It is more likely the patient's abdominal pain is a result of alcohol induced gastritis with  possible ulcer as he drinks frequently and has been under stress for the last few months.  It is recommended that the patient  take  Nexium and Carafate for abdominal pain and modifies his diet.  I will also refer him to a GI specialist to continue to monitor and manage his care. I will also provide him with information for a primary care doctor for continue management.  The patient was explained the results and plan patient verbalized that he agrees with plan above.  Final Clinical Impression(s) / ED Diagnoses Final diagnoses:  Intractable vomiting with nausea, unspecified vomiting type  Diarrhea, unspecified type  Rectal bleeding    Rx / DC Orders ED Discharge Orders         Ordered    ondansetron (ZOFRAN) 4 MG tablet  Every 6 hours     12/18/19 1059    sucralfate (CARAFATE) 1 g tablet  3 times daily with meals & bedtime     12/18/19 1059           Aron Baba 12/18/19 [place], Ankit, MD 12/18/19 1325

## 2019-12-18 NOTE — Discharge Instructions (Addendum)
You are being treated for nausea, vomiting, diarrhea and were sent home with Zofran for vomiting take as needed.  You are also sent home with Carafate which she can use for epigastric pain take as prescribed.  To help with stomach pain please refrain from drinking alcohol, spicy food, acidic foods, carbonated drinks.  Please take over-the-counter Nexium follow instructions found on the bottle.  Can consult with pharmacist for further dosing and questioning.  You are also given information for community health and wellness see them first to find a PCP and they can help with the GI specialist.  return the hospital if symptoms worsen i.e. you have uncontrolled vomiting or nausea, have severe abdominal pain, develops a fever, have increased frequency of blood in stool or vomit

## 2019-12-29 ENCOUNTER — Other Ambulatory Visit: Payer: Self-pay

## 2019-12-29 ENCOUNTER — Emergency Department (HOSPITAL_COMMUNITY): Payer: Self-pay

## 2019-12-29 ENCOUNTER — Encounter (HOSPITAL_COMMUNITY): Payer: Self-pay

## 2019-12-29 ENCOUNTER — Emergency Department (HOSPITAL_COMMUNITY)
Admission: EM | Admit: 2019-12-29 | Discharge: 2019-12-29 | Disposition: A | Discharge status: Home / Self Care | Payer: Self-pay | Attending: Emergency Medicine | Admitting: Emergency Medicine

## 2019-12-29 DIAGNOSIS — R1084 Generalized abdominal pain: Secondary | ICD-10-CM | POA: Insufficient documentation

## 2019-12-29 DIAGNOSIS — F17210 Nicotine dependence, cigarettes, uncomplicated: Secondary | ICD-10-CM | POA: Insufficient documentation

## 2019-12-29 DIAGNOSIS — R112 Nausea with vomiting, unspecified: Secondary | ICD-10-CM | POA: Insufficient documentation

## 2019-12-29 DIAGNOSIS — R197 Diarrhea, unspecified: Secondary | ICD-10-CM | POA: Insufficient documentation

## 2019-12-29 LAB — URINALYSIS, ROUTINE W REFLEX MICROSCOPIC
Bilirubin Urine: NEGATIVE
Glucose, UA: NEGATIVE mg/dL
Hgb urine dipstick: NEGATIVE
Ketones, ur: 5 mg/dL — AB
Leukocytes,Ua: NEGATIVE
Nitrite: NEGATIVE
Protein, ur: 30 mg/dL — AB
Specific Gravity, Urine: 1.029 (ref 1.005–1.030)
pH: 9 — ABNORMAL HIGH (ref 5.0–8.0)

## 2019-12-29 LAB — CBC WITH DIFFERENTIAL/PLATELET
Abs Immature Granulocytes: 0.1 10*3/uL — ABNORMAL HIGH (ref 0.00–0.07)
Basophils Absolute: 0.1 10*3/uL (ref 0.0–0.1)
Basophils Relative: 1 %
Eosinophils Absolute: 0.3 10*3/uL (ref 0.0–0.5)
Eosinophils Relative: 1 %
HCT: 48.4 % (ref 39.0–52.0)
Hemoglobin: 17.3 g/dL — ABNORMAL HIGH (ref 13.0–17.0)
Immature Granulocytes: 1 %
Lymphocytes Relative: 13 %
Lymphs Abs: 2.3 10*3/uL (ref 0.7–4.0)
MCH: 33.1 pg (ref 26.0–34.0)
MCHC: 35.7 g/dL (ref 30.0–36.0)
MCV: 92.5 fL (ref 80.0–100.0)
Monocytes Absolute: 1 10*3/uL (ref 0.1–1.0)
Monocytes Relative: 6 %
Neutro Abs: 14.3 10*3/uL — ABNORMAL HIGH (ref 1.7–7.7)
Neutrophils Relative %: 78 %
Platelets: 359 10*3/uL (ref 150–400)
RBC: 5.23 MIL/uL (ref 4.22–5.81)
RDW: 12.8 % (ref 11.5–15.5)
WBC: 18.1 10*3/uL — ABNORMAL HIGH (ref 4.0–10.5)
nRBC: 0 % (ref 0.0–0.2)

## 2019-12-29 LAB — COMPREHENSIVE METABOLIC PANEL
ALT: 32 U/L (ref 0–44)
AST: 27 U/L (ref 15–41)
Albumin: 4.7 g/dL (ref 3.5–5.0)
Alkaline Phosphatase: 60 U/L (ref 38–126)
Anion gap: 14 (ref 5–15)
BUN: 15 mg/dL (ref 6–20)
CO2: 23 mmol/L (ref 22–32)
Calcium: 9.5 mg/dL (ref 8.9–10.3)
Chloride: 103 mmol/L (ref 98–111)
Creatinine, Ser: 0.92 mg/dL (ref 0.61–1.24)
GFR calc Af Amer: >60 mL/min (ref >60)
GFR calc non Af Amer: >60 mL/min (ref >60)
Glucose, Bld: 121 mg/dL — ABNORMAL HIGH (ref 70–99)
Potassium: 4 mmol/L (ref 3.5–5.1)
Sodium: 140 mmol/L (ref 135–145)
Total Bilirubin: 0.6 mg/dL (ref 0.3–1.2)
Total Protein: 8 g/dL (ref 6.5–8.1)

## 2019-12-29 LAB — LIPASE, BLOOD: Lipase: 47 U/L (ref 11–51)

## 2019-12-29 LAB — RAPID URINE DRUG SCREEN, HOSP PERFORMED
Amphetamines: NOT DETECTED
Barbiturates: NOT DETECTED
Benzodiazepines: NOT DETECTED
Cocaine: NOT DETECTED
Opiates: NOT DETECTED
Tetrahydrocannabinol: POSITIVE — AB

## 2019-12-29 MED ORDER — SODIUM CHLORIDE (PF) 0.9 % IJ SOLN
INTRAMUSCULAR | Status: AC
  Filled 2019-12-29: qty 50

## 2019-12-29 MED ORDER — LIDOCAINE VISCOUS HCL 2 % MT SOLN
15.0000 mL | Freq: Once | OROMUCOSAL | Status: AC
  Administered 2019-12-29: 15 mL via ORAL
  Filled 2019-12-29: qty 15

## 2019-12-29 MED ORDER — SODIUM CHLORIDE 0.9 % IV BOLUS
1000.0000 mL | Freq: Once | INTRAVENOUS | Status: AC
  Administered 2019-12-29: 1000 mL via INTRAVENOUS

## 2019-12-29 MED ORDER — ALUM & MAG HYDROXIDE-SIMETH 200-200-20 MG/5ML PO SUSP
30.0000 mL | Freq: Once | ORAL | Status: AC
  Administered 2019-12-29: 30 mL via ORAL
  Filled 2019-12-29: qty 30

## 2019-12-29 MED ORDER — ONDANSETRON HCL 4 MG/2ML IJ SOLN
4.0000 mg | Freq: Once | INTRAMUSCULAR | Status: AC
  Administered 2019-12-29: 4 mg via INTRAVENOUS
  Filled 2019-12-29: qty 2

## 2019-12-29 MED ORDER — ALUMINUM-MAGNESIUM-SIMETHICONE 200-200-20 MG/5ML PO SUSP: 15.0000 mL | Freq: Three times a day (TID) | ORAL | 0 refills | Status: AC

## 2019-12-29 MED ORDER — IOHEXOL 300 MG/ML  SOLN
100.0000 mL | Freq: Once | INTRAMUSCULAR | Status: AC | PRN
  Administered 2019-12-29: 100 mL via INTRAVENOUS

## 2019-12-29 MED ORDER — FENTANYL CITRATE (PF) 100 MCG/2ML IJ SOLN
50.0000 ug | Freq: Once | INTRAMUSCULAR | Status: AC
  Administered 2019-12-29: 50 ug via INTRAVENOUS
  Filled 2019-12-29: qty 2

## 2019-12-29 MED ORDER — PROMETHAZINE HCL 25 MG/ML IJ SOLN
12.5000 mg | Freq: Once | INTRAMUSCULAR | Status: AC
  Administered 2019-12-29: 12.5 mg via INTRAVENOUS
  Filled 2019-12-29: qty 1

## 2019-12-29 MED ORDER — ALUMINUM & MAGNESIUM HYDROXIDE 200-200 MG/5ML PO SUSP: 5.0000 mL | ORAL | 0 refills | Status: DC | PRN

## 2019-12-29 NOTE — ED Notes (Signed)
Patient made aware we need urine specimen, urinal at bedside.

## 2019-12-29 NOTE — ED Notes (Signed)
Blue and dark green save tube in main lab

## 2019-12-29 NOTE — ED Notes (Signed)
Labeled urine specimen and culture sent to lab. ENMiles 

## 2019-12-29 NOTE — ED Provider Notes (Signed)
[facility]-EMERGENCY DEPT Provider Note   CSN: [id number] Arrival date & time: 12/29/19  0554     History Chief Complaint  Patient presents with  . Abdominal Pain  . Emesis    Christopher Estes is a 34 y.o. male with pertinent past medical history of hyperlipidemia, insomnia, migraine that presents the emergency department today for abdominal pain.  Patient was seen here 5/20 for the same, was discharged after pain resolved  with GI cocktail, sent home with Zofran and Carafate.  Last provider thought that epigastric pain was related to alcohol induced gastritis.  When speaking to the patient today, patient states that after he left the emergency department he still continued to have pain and vomiting.  Patient states that he vomits about 10 times a day.  States that he has not been able to keep anything down. States that he feels dehydrated.  States that he has streaks of blood in his vomit, no gross hematemesis.  No coffee-ground emesis.  Patient states that his pain in his stomach is now in his umbilical region, states that pain when he arrived 11 days ago was in his epigastric region.  Also admits to diarrhea, states that bloody diarrhea has resolved.  Patient states that he has not drank since this incident.  Patient does admit to smoking marijuana daily.  Patient has not tried anything for this, states that he has been taking the Zofran and Carafate as prescribed which have not been helping.  Did not see GI specialist.  States that he had to miss work last week due to the pain and vomiting.  States that similar thing happened to him 5 years ago and was diagnosed with small bout of diverticulitis.  Was in normal health before previous emergency visit.  Also admits to some subjective fevers and chills.  Has not taken temperature at home.  Has not taken any antipyretics.  Denies any chest pain, shortness of breath, back pain, dizziness, headache, vision changes, weakness, fatigue.   Patient denies any abdominal surgeries.  HPI     Past Medical History:  Diagnosis Date  . Hyperlipidemia   . Insomnia   . Migraine     There are no problems to display for this patient.   Past Surgical History:  Procedure Laterality Date  . INNER EAR SURGERY    . TONSILLECTOMY         Family History  Problem Relation Age of Onset  . Diabetes Mother   . Hypertension Mother   . Asthma Mother   . Cancer Father   . Stroke Father   . Thyroid disease Father     Social History   Tobacco Use  . Smoking status: Current Every Day Smoker    Packs/day: 1.00    Types: Cigarettes  . Smokeless tobacco: Never Used  Substance Use Topics  . Alcohol use: Yes    Alcohol/week: 24.0 standard drinks    Types: 24 Cans of beer per week    Comment: weekly  . Drug use: Yes    Types: Marijuana    Home Medications Prior to Admission medications   Medication Sig Start Date End Date Taking? Authorizing Provider  ondansetron (ZOFRAN) 4 MG tablet Take 1 tablet (4 mg total) by mouth every 6 (six) hours. 12/18/19  Yes Carroll Sage, PA-C  sucralfate (CARAFATE) 1 g tablet Take 1 tablet (1 g total) by mouth 4 (four) times daily -  with meals and at bedtime for 10 days. 12/18/19  12/29/19 Yes Marcello Fennel, PA-C  aluminum-magnesium hydroxide-simethicone (MAALOX) 200-200-20 MG/5ML SUSP Take 15 mLs by mouth 4 (four) times daily -  before meals and at bedtime for 7 days. Do not use for more than one week. Only use as needed 12/29/19 01/05/20  Alfredia Client, PA-C  metoCLOPramide (REGLAN) 10 MG tablet Take 1 tablet (10 mg total) by mouth every 8 (eight) hours as needed for nausea or vomiting. Patient not taking: Reported on 11/20/2017 04/18/16 09/16/19  Jola Schmidt, MD  promethazine (PHENERGAN) 12.5 MG tablet Take 1 tablet (12.5 mg total) by mouth every 6 (six) hours as needed for nausea or vomiting. Patient not taking: Reported on 11/20/2017 07/28/17 09/16/19  Virgel Manifold, MD    Allergies      Dilaudid [hydromorphone hcl]  Review of Systems   Review of Systems  Constitutional: Negative for chills, diaphoresis, fatigue and fever.  HENT: Negative for congestion, rhinorrhea, sinus pressure, sore throat, trouble swallowing and voice change.   Eyes: Negative for pain and visual disturbance.  Respiratory: Negative for cough, shortness of breath and wheezing.   Cardiovascular: Negative for chest pain, palpitations and leg swelling.  Gastrointestinal: Positive for abdominal pain, diarrhea, nausea and vomiting. Negative for abdominal distention, blood in stool and rectal pain.  Genitourinary: Negative for difficulty urinating, flank pain, frequency and hematuria.  Musculoskeletal: Negative for back pain, neck pain and neck stiffness.  Skin: Negative for pallor.  Neurological: Negative for dizziness, tremors, syncope, speech difficulty, weakness, light-headedness, numbness and headaches.  Psychiatric/Behavioral: Negative for confusion.    Physical Exam Updated Vital Signs BP (!) 145/82   Pulse 87   Temp 97.9 F (36.6 C) (Oral)   Resp 16   Ht 5\' 11"  (1.803 m)   Wt 89.8 kg   SpO2 98%   BMI 27.62 kg/m   Physical Exam Constitutional:      General: He is not in acute distress.    Appearance: Normal appearance. He is not ill-appearing, toxic-appearing or diaphoretic.     Comments: Patient is actively dry heaving while examining him in the room.  Patient has about 100 cc of spit and vomit bag.  Patient appears uncomfortable, is nonacute distress.  HENT:     Head: Normocephalic.     Mouth/Throat:     Mouth: Mucous membranes are dry.     Pharynx: Oropharynx is clear.  Eyes:     General: No scleral icterus.    Extraocular Movements: Extraocular movements intact.     Conjunctiva/sclera: Conjunctivae normal.     Pupils: Pupils are equal, round, and reactive to light.  Cardiovascular:     Rate and Rhythm: Normal rate and regular rhythm.     Pulses: Normal pulses.     Heart  sounds: Normal heart sounds. No murmur.  Pulmonary:     Effort: Pulmonary effort is normal. No respiratory distress.     Breath sounds: Normal breath sounds. No stridor. No wheezing, rhonchi or rales.  Chest:     Chest wall: No tenderness.  Abdominal:     General: Abdomen is flat. Bowel sounds are normal. There is no distension.     Palpations: Abdomen is soft. There is no shifting dullness, hepatomegaly, splenomegaly, mass or pulsatile mass.     Tenderness: There is abdominal tenderness in the periumbilical area. There is guarding. There is no right CVA tenderness, left CVA tenderness or rebound. Negative signs include Murphy's sign, Rovsing's sign, McBurney's sign, psoas sign and obturator sign.  Musculoskeletal:  General: No swelling or tenderness. Normal range of motion.     Cervical back: Normal range of motion and neck supple. No rigidity.     Right lower leg: No edema.     Left lower leg: No edema.     Comments: No tenderness to cervical, thoracic, or lumbar spine.  No paraspinal muscle tenderness.  Skin:    General: Skin is warm and dry.     Capillary Refill: Capillary refill takes less than 2 seconds.     Coloration: Skin is not jaundiced or pale.     Findings: No bruising.  Neurological:     General: No focal deficit present.     Mental Status: He is alert and oriented to person, place, and time.     Cranial Nerves: No cranial nerve deficit.     Sensory: No sensory deficit.     Motor: No weakness.     Coordination: Coordination normal.  Psychiatric:        Mood and Affect: Mood normal. Mood is not anxious.        Behavior: Behavior normal.     ED Results / Procedures / Treatments   Labs (all labs ordered are listed, but only abnormal results are displayed) Labs Reviewed  CBC WITH DIFFERENTIAL/PLATELET - Abnormal; Notable for the following components:      Result Value   WBC 18.1 (*)    Hemoglobin 17.3 (*)    Neutro Abs 14.3 (*)    Abs Immature Granulocytes  0.10 (*)    All other components within normal limits  COMPREHENSIVE METABOLIC PANEL - Abnormal; Notable for the following components:   Glucose, Bld 121 (*)    All other components within normal limits  URINALYSIS, ROUTINE W REFLEX MICROSCOPIC - Abnormal; Notable for the following components:   pH 9.0 (*)    Ketones, ur 5 (*)    Protein, ur 30 (*)    Bacteria, UA RARE (*)    All other components within normal limits  RAPID URINE DRUG SCREEN, HOSP PERFORMED - Abnormal; Notable for the following components:   Tetrahydrocannabinol POSITIVE (*)    All other components within normal limits  LIPASE, BLOOD    EKG None  Radiology CT ABDOMEN PELVIS W CONTRAST  Result Date: 12/29/2019 CLINICAL DATA:  Abdominal pain EXAM: CT ABDOMEN AND PELVIS WITH CONTRAST TECHNIQUE: Multidetector CT imaging of the abdomen and pelvis was performed using the standard protocol following bolus administration of intravenous contrast. CONTRAST:  100mL OMNIPAQUE IOHEXOL 300 MG/ML  SOLN COMPARISON:  November 20, 2017 FINDINGS: Lower chest: Lung bases are clear. Hepatobiliary: No focal liver lesions are appreciable. Gallbladder wall is not appreciably thickened. There is no biliary duct dilatation. Pancreas: There is no pancreatic mass or inflammatory focus. Spleen: No splenic lesions are evident.  Small splenules noted. Adrenals/Urinary Tract: Adrenals bilaterally appear normal. Kidneys bilaterally show no evident mass or hydronephrosis on either side. There is no renal or ureteral calculus on either side. Urinary bladder is midline with wall thickness within normal limits. Stomach/Bowel: There is no appreciable bowel wall or mesenteric thickening. There is no evident bowel obstruction. The terminal ileum appears normal. There is no evident free air or portal venous air. Vascular/Lymphatic: No abdominal aortic aneurysm. No arterial vascular lesions evident. Major venous structures appear patent. There is no evident adenopathy in  the abdomen or pelvis. Reproductive: Prostate and seminal vesicles are normal in size and contour. No evident pelvic mass. Other: Appendix appears normal. No evident abscess or ascites  in the abdomen or pelvis. Musculoskeletal: No blastic or lytic bone lesions. No intramuscular or abdominal wall lesions are evident. IMPRESSION: 1. A cause for patient's symptoms has not been established with this study. 2. No bowel wall thickening or bowel obstruction. No abscess in the abdomen pelvis. Appendix appears normal. 3. No renal or ureteral calculus. No hydronephrosis. Urinary bladder wall thickness within normal limits. Electronically Signed   By: Bretta Bang III M.D.   On: 12/29/2019 09:21    Procedures Procedures (including critical care time)  Medications Ordered in ED Medications  sodium chloride 0.9 % bolus 1,000 mL (0 mLs Intravenous Stopped 12/29/19 0857)  fentaNYL (SUBLIMAZE) injection 50 mcg (50 mcg Intravenous Given 12/29/19 0732)  ondansetron (ZOFRAN) injection 4 mg (4 mg Intravenous Given 12/29/19 0732)  sodium chloride 0.9 % bolus 1,000 mL (0 mLs Intravenous Stopped 12/29/19 1131)  promethazine (PHENERGAN) injection 12.5 mg (12.5 mg Intravenous Given 12/29/19 0856)  sodium chloride (PF) 0.9 % injection (  Given by Other 12/29/19 1015)  iohexol (OMNIPAQUE) 300 MG/ML solution 100 mL (100 mLs Intravenous Contrast Given 12/29/19 0904)  alum & mag hydroxide-simeth (MAALOX/MYLANTA) 200-200-20 MG/5ML suspension 30 mL (30 mLs Oral Given 12/29/19 1013)    And  lidocaine (XYLOCAINE) 2 % viscous mouth solution 15 mL (15 mLs Oral Given 12/29/19 1013)    ED Course  I have reviewed the triage vital signs and the nursing notes.  Pertinent labs & imaging results that were available during my care of the patient were reviewed by me and considered in my medical decision making (see chart for details).  Clinical Course as of Dec 28 1229  Mon Dec 29, 2019  1205 WBC(!): 18.1 [SP]    Clinical Course User  Index [SP] Farrel Gordon, PA-C   MDM Rules/Calculators/A&P                     Emiliano Welshans is a 34 y.o. male with pertinent past medical history of hyperlipidemia, insomnia, migraine that presents the emergency department today for abdominal pain.  Patient was seen here 5/20 for the same, was discharged after resolve meant of pain with GI cocktail, sent home with Zofran and Carafate.  At the last visit rectal exam was done, Hemoccult was negative.  We will obtain basic labs today and also CT scan of the abdomen to make sure patient does not have diverticulitis again, since patient has returned to the emergency department and symptoms have not gone away.  No scans were done last time.  Although unlikely, need to rule out appendicitis, perforated ulcer, diverticulitis.  Initial interventions include IV fluids, Zofran, fentanyl for pain. Labs demonstrated CBC with leukocytosis of 18.1 Urinalysis suggestive of dehydration, no signs of urinary tract infection.  Patient without dysuria or hematuria.    Upon reassessment after Maalox, patient states that pain and nausea have gone down to a 2/10, is ready for discharge.  States that he feels so much better.  Requested p.o. challenge at this time.  Patient and I also discussed blood pressure elevation, and need for follow-up with PCP, patient agreeable.  Patient wants to be discharged with GI cocktail for home use.  Discussed that if symptoms persist to have stool studies done to see if patient has some type of gastroenteritis.  Nausea and vomiting is patient's main concern, and could be due to gastroenteritis or hyperemesis cannabis.  Leukocytosis is most likely reactive from vomiting.  No other signs of infection.  Patient is afebrile without  any antipyretics on board. Repeat abdominal exam without any tenderness of guarding.  Patient without any peritoneal or surgical abdomen.  Do not think we need to give antibiotics at this time.  .Doubt need for further  emergent work up at this time. I explained the diagnosis and have given explicit precautions to return to the ER including for any other new or worsening symptoms. The patient understands and accepts the medical plan as it's been dictated and I have answered their questions. Discharge instructions concerning home care and prescriptions have been given. The patient is STABLE and is discharged to home in good condition.  Passed PO challenge. Wants to go home. Will discharge with Maalox for one week. Pt still has GI referral from last visit. Return precautions discussed.   The plan for this patient was discussed with Dr. Doristine Counter, who voiced agreement and who oversaw evaluation and treatment of this patient.  Final Clinical Impression(s) / ED Diagnoses Final diagnoses:  Nausea vomiting and diarrhea  Generalized abdominal pain    Rx / DC Orders    Farrel Gordon, PA-C 12/30/19 1002    Arby Barrette, MD 01/07/20 1313

## 2019-12-29 NOTE — ED Notes (Signed)
Patient transported to CT 

## 2019-12-29 NOTE — ED Notes (Signed)
Shalyn, PA made aware patient diastolic BP has been over 100 since arrival.

## 2019-12-29 NOTE — ED Notes (Signed)
Patient given ice water and crackers for PO challenge per [place], PA.

## 2019-12-29 NOTE — Discharge Instructions (Addendum)
You are seen today for nausea, vomiting, abdominal pain.  This could be due to gastroenteritis or could be to to marijuana use.  Going to send you home with Maalox which was given to you today in the emergency department.  Your CT scan was normal as we discussed.  I want you to take that only when you have abdominal pain.  Come back to the emergency department for worsening pain.  I want you to follow-up with a primary care doctor and speak to them about your blood pressure and your ongoing abdominal pain.

## 2019-12-29 NOTE — ED Triage Notes (Signed)
Pt sts umbilicus abdominal pain since 5/20 when he was seen last. Pt shaking and dry heaving in triage.

## 2019-12-29 NOTE — ED Notes (Signed)
PT aware of urine sample 

## 2020-02-13 ENCOUNTER — Emergency Department (HOSPITAL_COMMUNITY)
Admission: EM | Admit: 2020-02-13 | Discharge: 2020-02-13 | Disposition: A | Discharge status: Home / Self Care | Payer: Self-pay | Attending: Emergency Medicine | Admitting: Emergency Medicine

## 2020-02-13 ENCOUNTER — Encounter (HOSPITAL_COMMUNITY): Payer: Self-pay | Admitting: Emergency Medicine

## 2020-02-13 DIAGNOSIS — F17210 Nicotine dependence, cigarettes, uncomplicated: Secondary | ICD-10-CM | POA: Insufficient documentation

## 2020-02-13 DIAGNOSIS — F1290 Cannabis use, unspecified, uncomplicated: Secondary | ICD-10-CM | POA: Insufficient documentation

## 2020-02-13 DIAGNOSIS — R1110 Vomiting, unspecified: Secondary | ICD-10-CM

## 2020-02-13 LAB — CBC WITH DIFFERENTIAL/PLATELET
Abs Immature Granulocytes: 0.09 10*3/uL — ABNORMAL HIGH (ref 0.00–0.07)
Basophils Absolute: 0.1 10*3/uL (ref 0.0–0.1)
Basophils Relative: 1 %
Eosinophils Absolute: 0 10*3/uL (ref 0.0–0.5)
Eosinophils Relative: 0 %
HCT: 50.2 % (ref 39.0–52.0)
Hemoglobin: 17.9 g/dL — ABNORMAL HIGH (ref 13.0–17.0)
Immature Granulocytes: 1 %
Lymphocytes Relative: 9 %
Lymphs Abs: 1.6 10*3/uL (ref 0.7–4.0)
MCH: 32.6 pg (ref 26.0–34.0)
MCHC: 35.7 g/dL (ref 30.0–36.0)
MCV: 91.4 fL (ref 80.0–100.0)
Monocytes Absolute: 0.6 10*3/uL (ref 0.1–1.0)
Monocytes Relative: 3 %
Neutro Abs: 16.4 10*3/uL — ABNORMAL HIGH (ref 1.7–7.7)
Neutrophils Relative %: 86 %
Platelets: 386 10*3/uL (ref 150–400)
RBC: 5.49 MIL/uL (ref 4.22–5.81)
RDW: 12.9 % (ref 11.5–15.5)
WBC: 18.8 10*3/uL — ABNORMAL HIGH (ref 4.0–10.5)
nRBC: 0 % (ref 0.0–0.2)

## 2020-02-13 LAB — COMPREHENSIVE METABOLIC PANEL
ALT: 31 U/L (ref 0–44)
AST: 26 U/L (ref 15–41)
Albumin: 4.4 g/dL (ref 3.5–5.0)
Alkaline Phosphatase: 59 U/L (ref 38–126)
Anion gap: 13 (ref 5–15)
BUN: 11 mg/dL (ref 6–20)
CO2: 19 mmol/L — ABNORMAL LOW (ref 22–32)
Calcium: 8.9 mg/dL (ref 8.9–10.3)
Chloride: 109 mmol/L (ref 98–111)
Creatinine, Ser: 0.68 mg/dL (ref 0.61–1.24)
GFR calc Af Amer: >60 mL/min (ref >60)
GFR calc non Af Amer: >60 mL/min (ref >60)
Glucose, Bld: 106 mg/dL — ABNORMAL HIGH (ref 70–99)
Potassium: 3.3 mmol/L — ABNORMAL LOW (ref 3.5–5.1)
Sodium: 141 mmol/L (ref 135–145)
Total Bilirubin: 0.8 mg/dL (ref 0.3–1.2)
Total Protein: 7.5 g/dL (ref 6.5–8.1)

## 2020-02-13 LAB — LIPASE, BLOOD: Lipase: 21 U/L (ref 11–51)

## 2020-02-13 MED ORDER — SODIUM CHLORIDE 0.9 % IV BOLUS
1000.0000 mL | Freq: Once | INTRAVENOUS | Status: AC
  Administered 2020-02-13: 1000 mL via INTRAVENOUS

## 2020-02-13 MED ORDER — HALOPERIDOL LACTATE 5 MG/ML IJ SOLN
5.0000 mg | Freq: Once | INTRAMUSCULAR | Status: AC
  Administered 2020-02-13: 5 mg via INTRAVENOUS
  Filled 2020-02-13: qty 1

## 2020-02-13 MED ORDER — PROMETHAZINE HCL 25 MG PO TABS
25.0000 mg | ORAL_TABLET | Freq: Four times a day (QID) | ORAL | 0 refills | Status: DC | PRN
Start: 2020-02-13 — End: 2020-03-08

## 2020-02-13 MED ORDER — FAMOTIDINE IN NACL 20-0.9 MG/50ML-% IV SOLN
20.0000 mg | INTRAVENOUS | Status: AC
  Administered 2020-02-13: 20 mg via INTRAVENOUS
  Filled 2020-02-13: qty 50

## 2020-02-13 MED ORDER — CAPSAICIN 0.025 % EX CREA
TOPICAL_CREAM | Freq: Once | CUTANEOUS | Status: AC
  Administered 2020-02-13: 1 via TOPICAL
  Filled 2020-02-13: qty 60

## 2020-02-13 MED ORDER — LIDOCAINE VISCOUS HCL 2 % MT SOLN
15.0000 mL | Freq: Once | OROMUCOSAL | Status: AC
  Administered 2020-02-13: 15 mL via ORAL
  Filled 2020-02-13: qty 15

## 2020-02-13 MED ORDER — ALUM & MAG HYDROXIDE-SIMETH 200-200-20 MG/5ML PO SUSP
30.0000 mL | Freq: Once | ORAL | Status: AC
  Administered 2020-02-13: 30 mL via ORAL
  Filled 2020-02-13: qty 30

## 2020-02-13 NOTE — Discharge Instructions (Signed)

## 2020-02-13 NOTE — ED Notes (Signed)
Patient reports he feels much better following Capsaicin cream and GI cocktail with fluids. Patient reports he understands the diet advancement plan and will follow.

## 2020-02-13 NOTE — ED Notes (Signed)
Patient has been sticking his fingers down his throat to self induce emesis. Patient is requesting more nausea medication. PA made aware

## 2020-02-13 NOTE — ED Triage Notes (Signed)
Reported syncopal episode in parking lot. Assisted on stretcher and into department by staff. C/o N/V x3 days. Hx cyclic vomiting.

## 2020-02-13 NOTE — ED Provider Notes (Signed)
[facility] Note   CSN: [id number] Arrival date & time: 02/13/20  1212     History Chief Complaint  Patient presents with  . Loss of Consciousness  . Emesis    Christopher Estes is a 34 y.o. male who presents with a cc vomiting. Patient has a history of recurrent episodes of nausea and vomiting.  He is a daily alcohol and marijuana user.  Patient will not verbally answer questions at this time but only nods yes or no.  He has had about 3 days of epigastric abdominal pain nausea and vomiting.  He indicates this with a #3 on his fingers.  He denies diarrhea.  He apparently told triage that he had a syncopal episode which he attributes to his dehydration.  He feels lightheaded when he stands.  HPI     Past Medical History:  Diagnosis Date  . Hyperlipidemia   . Insomnia   . Migraine     There are no problems to display for this patient.   Past Surgical History:  Procedure Laterality Date  . INNER EAR SURGERY    . TONSILLECTOMY         Family History  Problem Relation Age of Onset  . Diabetes Mother   . Hypertension Mother   . Asthma Mother   . Cancer Father   . Stroke Father   . Thyroid disease Father     Social History   Tobacco Use  . Smoking status: Current Every Day Smoker    Packs/day: 1.00    Types: Cigarettes  . Smokeless tobacco: Never Used  Vaping Use  . Vaping Use: Never used  Substance Use Topics  . Alcohol use: Yes    Alcohol/week: 24.0 standard drinks    Types: 24 Cans of beer per week    Comment: weekly  . Drug use: Yes    Types: Marijuana    Home Medications Prior to Admission medications   Medication Sig Start Date End Date Taking? Authorizing Provider  sucralfate (CARAFATE) 1 g tablet Take 1 tablet (1 g total) by mouth 4 (four) times daily -  with meals and at bedtime for 10 days. 12/18/19 02/13/20 Yes Carroll Sage, PA-C  ondansetron (ZOFRAN) 4 MG tablet Take 1 tablet (4 mg total) by mouth  every 6 (six) hours. Patient not taking: Reported on 02/13/2020 12/18/19   Carroll Sage, PA-C  promethazine (PHENERGAN) 25 MG tablet Take 1 tablet (25 mg total) by mouth every 6 (six) hours as needed for nausea or vomiting. 02/13/20   Arthor Captain, PA-C  metoCLOPramide (REGLAN) 10 MG tablet Take 1 tablet (10 mg total) by mouth every 8 (eight) hours as needed for nausea or vomiting. Patient not taking: Reported on 11/20/2017 04/18/16 09/16/19  Azalia Bilis, MD    Allergies    Dilaudid [hydromorphone hcl]  Review of Systems   Review of Systems Ten systems reviewed and are negative for acute change, except as noted in the HPI.   Physical Exam Updated Vital Signs BP 118/77 (BP Location: Right Arm)   Pulse 80   Temp 98 F (36.7 C) (Oral)   Resp 17   SpO2 96%   Physical Exam Vitals and nursing note reviewed.  Constitutional:      General: He is not in acute distress.    Appearance: He is well-developed. He is not diaphoretic.  HENT:     Head: Normocephalic and atraumatic.  Eyes:     General: No scleral icterus.  Conjunctiva/sclera: Conjunctivae normal.  Cardiovascular:     Rate and Rhythm: Normal rate and regular rhythm.     Heart sounds: Normal heart sounds.  Pulmonary:     Effort: Pulmonary effort is normal. No respiratory distress.     Breath sounds: Normal breath sounds.  Abdominal:     Palpations: Abdomen is soft.     Tenderness: There is abdominal tenderness.  Musculoskeletal:     Cervical back: Normal range of motion and neck supple.  Skin:    General: Skin is warm and dry.  Neurological:     Mental Status: He is alert.  Psychiatric:        Behavior: Behavior normal.     ED Results / Procedures / Treatments   Labs (all labs ordered are listed, but only abnormal results are displayed) Labs Reviewed  CBC WITH DIFFERENTIAL/PLATELET - Abnormal; Notable for the following components:      Result Value   WBC 18.8 (*)    Hemoglobin 17.9 (*)    Neutro Abs  16.4 (*)    Abs Immature Granulocytes 0.09 (*)    All other components within normal limits  COMPREHENSIVE METABOLIC PANEL - Abnormal; Notable for the following components:   Potassium 3.3 (*)    CO2 19 (*)    Glucose, Bld 106 (*)    All other components within normal limits  LIPASE, BLOOD    EKG EKG Interpretation  Date/Time:  Friday February 13 2020 13:49:07 EDT Ventricular Rate:  73 PR Interval:  134 QRS Duration: 92 QT Interval:  406 QTC Calculation: 447 R Axis:   87 Text Interpretation: Sinus rhythm with sinus arrhythmia with Fusion complexes Otherwise normal ECG Artifact Confirmed by Meridee Score [phone]) on 02/13/2020 2:24:11 PM   Radiology No results found.  Procedures Procedures (including critical care time)  Medications Ordered in ED Medications  haloperidol lactate (HALDOL) injection 5 mg (5 mg Intravenous Given 02/13/20 1227)  sodium chloride 0.9 % bolus 1,000 mL (0 mLs Intravenous Stopped 02/13/20 1331)  capsaicin (ZOSTRIX) 0.025 % cream (1 application Topical Given 02/13/20 1413)  alum & mag hydroxide-simeth (MAALOX/MYLANTA) 200-200-20 MG/5ML suspension 30 mL (30 mLs Oral Given 02/13/20 1331)    And  lidocaine (XYLOCAINE) 2 % viscous mouth solution 15 mL (15 mLs Oral Given 02/13/20 1331)  famotidine (PEPCID) IVPB 20 mg premix (0 mg Intravenous Stopped 02/13/20 1359)    ED Course  I have reviewed the triage vital signs and the nursing notes.  Pertinent labs & imaging results that were available during my care of the patient were reviewed by me and considered in my medical decision making (see chart for details).  Clinical Course as of Feb 13 899  Fri Feb 13, 2020  693 34 year old male here with complaint of 3 days of nausea vomiting epigastric abdominal pain and a syncopal event today.  Thinks he is dehydrated.  History of cyclic vomiting.  Last smoked marijuana 4 days ago and does not think it's the marijuana.  Abdomen is soft.  Getting fluids, labs,  symptomatic treatment   [MB]    Clinical Course User Index [MB] Terrilee Files, MD   MDM Rules/Calculators/A&P                          IE:[id number] pain /n/v VS:  Vitals:   02/13/20 1223 02/13/20 1434  BP: (!) 172/135 118/77  Pulse: 67 80  Resp: 20 17  Temp: 98 F (36.7 C) 98 F (  36.7 C)  TempSrc: Oral Oral  SpO2: 100% 96%    NI:DPOEUMP is gathered by patient  and EMR. Previous records obtained and reviewed. DDX:The patient's complaint of vomiting involves an extensive number of diagnostic and treatment options, and is a complaint that carries with it a high risk of complications, morbidity, and potential mortality. Given the large differential diagnosis, medical decision making is of high complexity. The emergent differential diagnosis for vomiting includes, but is not limited to ACS/MI, Boerhaave's, DKA, Intracranial Hemorrhage, Ischemic bowel, Meningitis, Sepsis, Acute radiation syndrome, Acute gastric dilation, Acetaminophen toxicity, Adrenal insufficiency, Appendicitis, Aspirin toxicity, Bowel obstruction/ileus, Carbon monoxide poisoning, Cholecystitis, CNS tumor. Digoxin toxicity, Electrolyte abnormalities, Elevated ICP, Gastric outlet obstruction, Hyperemesis gravidarum, Pancreatitis, Peritonitis, Ruptured viscus, Testicular torsion/ovarian torsion, Theophyline toxicity, Biliary colic, Cannabinoid hyperemesis syndrome, Chemotherapy, Disulfiram effect, Erythromycin, ETOH, Gastritis, Gastroenteritis, Gastroparesis, Hepatitis, Ibuprofen, Ipecac toxicity, Labyrinthitis, Migraine, Motion sickness, Narcotic withdrawal, Thyroid, Pregnancy, Peptic ulcer disease, Renal colic, and UTI Labs: I ordered reviewed and interpreted labs which include Lipase,CMP with mild hypokalemia, CBC shows elevated white blood cell count.  Hemoglobin is elevated.  He is a daily smoker and likely has some volume contraction. Imaging:. EKG: Sinus arrhythmia at a rate of 73 Consults: None MDM: Patient here  with recurrent episodes of nausea vomiting and abdominal minimal pain.  Patient was given Haldol, Pepcid, GI cocktail and capsaicin.  Patient states that he feels "amazing."  He states that his abdominal pain is totally resolved and on reevaluation he has no tenderness.  I do not think that there is an intra-abdominal process despite the patient's elevated white blood cell count.  I do think this is likely just acute phase reaction secondary to his pain and vomiting upon arrival along with some dehydration.  Patient vomiting is probably multifactorial and either secondary to gastritis from drinking alcohol or cannabis hyperemesis.  The patient states that he is trying to cut back on drinking and has done that however continues to drink daily.  He is tolerating p.o. fluids and would like to be discharged and feels looks appropriate for that at this time.  Discussed return precautions. Patient disposition:discahrge  The patient appears reasonably screened and/or stabilized for discharge and I doubt any other medical condition or other [facility] requiring further screening, evaluation, or treatment in the ED at this time prior to discharge. I have discussed lab and/or imaging findings with the patient and answered all questions/concerns to the best of my ability.I have discussed return precautions and OP follow up.    Final Clinical Impression(s) / ED Diagnoses Final diagnoses:  Recurrent vomiting    Rx / DC Orders ED Discharge Orders         Ordered    promethazine (PHENERGAN) 25 MG tablet  Every 6 hours PRN     Discontinue  Reprint     02/13/20 1457           Arthor Captain, PA-C 02/14/20 0902    Terrilee Files, MD 02/14/20 1021

## 2020-03-07 ENCOUNTER — Other Ambulatory Visit: Payer: Self-pay

## 2020-03-07 ENCOUNTER — Ambulatory Visit (HOSPITAL_COMMUNITY)
Admission: EM | Admit: 2020-03-07 | Discharge: 2020-03-07 | Disposition: A | Discharge status: Home / Self Care | Payer: Self-pay | Attending: Emergency Medicine | Admitting: Emergency Medicine

## 2020-03-07 ENCOUNTER — Encounter (HOSPITAL_COMMUNITY): Payer: Self-pay

## 2020-03-07 DIAGNOSIS — M25511 Pain in right shoulder: Secondary | ICD-10-CM

## 2020-03-07 DIAGNOSIS — S46811A Strain of other muscles, fascia and tendons at shoulder and upper arm level, right arm, initial encounter: Secondary | ICD-10-CM

## 2020-03-07 MED ORDER — CYCLOBENZAPRINE HCL 5 MG PO TABS: 5.0000 mg | ORAL_TABLET | Freq: Two times a day (BID) | ORAL | 0 refills | Status: DC | PRN

## 2020-03-07 MED ORDER — PREDNISONE 10 MG PO TABS: ORAL_TABLET | ORAL | 0 refills | Status: DC

## 2020-03-07 NOTE — Discharge Instructions (Signed)
Begin prednisone course over the next 6 days-she will begin with 6 tabs/60 mg on day 1, decrease by 1 tablet each day until complete-6, 5, 4, 3, 2, 1.  Take with food and in the morning if you are able You may use flexeril as needed to help with pain. This is a muscle relaxer and causes sedation- please use only at bedtime or when you will be home and not have to drive/work Alternate ice and heat Gentle stretching/exercises for shoulder at home-see attached  Please follow-up with sports medicine if symptoms persisting or worsening

## 2020-03-07 NOTE — ED Triage Notes (Signed)
Pt presents with right shoulder pain X 1 month not injury related.

## 2020-03-08 ENCOUNTER — Emergency Department (HOSPITAL_COMMUNITY)
Admission: EM | Admit: 2020-03-08 | Discharge: 2020-03-08 | Disposition: A | Discharge status: Home / Self Care | Payer: Self-pay | Attending: Emergency Medicine | Admitting: Emergency Medicine

## 2020-03-08 ENCOUNTER — Emergency Department (HOSPITAL_COMMUNITY): Payer: Self-pay

## 2020-03-08 ENCOUNTER — Encounter (HOSPITAL_COMMUNITY): Payer: Self-pay | Admitting: Student

## 2020-03-08 ENCOUNTER — Other Ambulatory Visit: Payer: Self-pay

## 2020-03-08 DIAGNOSIS — R55 Syncope and collapse: Secondary | ICD-10-CM | POA: Insufficient documentation

## 2020-03-08 DIAGNOSIS — R112 Nausea with vomiting, unspecified: Secondary | ICD-10-CM | POA: Insufficient documentation

## 2020-03-08 DIAGNOSIS — F17210 Nicotine dependence, cigarettes, uncomplicated: Secondary | ICD-10-CM | POA: Insufficient documentation

## 2020-03-08 DIAGNOSIS — R079 Chest pain, unspecified: Secondary | ICD-10-CM | POA: Insufficient documentation

## 2020-03-08 DIAGNOSIS — R197 Diarrhea, unspecified: Secondary | ICD-10-CM | POA: Insufficient documentation

## 2020-03-08 LAB — COMPREHENSIVE METABOLIC PANEL
ALT: 28 U/L (ref 0–44)
AST: 25 U/L (ref 15–41)
Albumin: 4.4 g/dL (ref 3.5–5.0)
Alkaline Phosphatase: 55 U/L (ref 38–126)
Anion gap: 15 (ref 5–15)
BUN: 10 mg/dL (ref 6–20)
CO2: 20 mmol/L — ABNORMAL LOW (ref 22–32)
Calcium: 9.6 mg/dL (ref 8.9–10.3)
Chloride: 107 mmol/L (ref 98–111)
Creatinine, Ser: 0.82 mg/dL (ref 0.61–1.24)
GFR calc Af Amer: >60 mL/min (ref >60)
GFR calc non Af Amer: >60 mL/min (ref >60)
Glucose, Bld: 116 mg/dL — ABNORMAL HIGH (ref 70–99)
Potassium: 3.8 mmol/L (ref 3.5–5.1)
Sodium: 142 mmol/L (ref 135–145)
Total Bilirubin: 1 mg/dL (ref 0.3–1.2)
Total Protein: 7.4 g/dL (ref 6.5–8.1)

## 2020-03-08 LAB — CBC WITH DIFFERENTIAL/PLATELET
Abs Immature Granulocytes: 0.1 10*3/uL — ABNORMAL HIGH (ref 0.00–0.07)
Basophils Absolute: 0.1 10*3/uL (ref 0.0–0.1)
Basophils Relative: 0 %
Eosinophils Absolute: 0 10*3/uL (ref 0.0–0.5)
Eosinophils Relative: 0 %
HCT: 46.8 % (ref 39.0–52.0)
Hemoglobin: 16.2 g/dL (ref 13.0–17.0)
Immature Granulocytes: 1 %
Lymphocytes Relative: 13 %
Lymphs Abs: 2.8 10*3/uL (ref 0.7–4.0)
MCH: 32 pg (ref 26.0–34.0)
MCHC: 34.6 g/dL (ref 30.0–36.0)
MCV: 92.3 fL (ref 80.0–100.0)
Monocytes Absolute: 1.6 10*3/uL — ABNORMAL HIGH (ref 0.1–1.0)
Monocytes Relative: 7 %
Neutro Abs: 16.5 10*3/uL — ABNORMAL HIGH (ref 1.7–7.7)
Neutrophils Relative %: 79 %
Platelets: 379 10*3/uL (ref 150–400)
RBC: 5.07 MIL/uL (ref 4.22–5.81)
RDW: 13.2 % (ref 11.5–15.5)
WBC: 21 10*3/uL — ABNORMAL HIGH (ref 4.0–10.5)
nRBC: 0 % (ref 0.0–0.2)

## 2020-03-08 LAB — TROPONIN I (HIGH SENSITIVITY)
Troponin I (High Sensitivity): 5 ng/L (ref <18)
Troponin I (High Sensitivity): 3 ng/L (ref <18)

## 2020-03-08 LAB — LIPASE, BLOOD: Lipase: 31 U/L (ref 11–51)

## 2020-03-08 MED ORDER — PROMETHAZINE HCL 12.5 MG PO TABS
12.5000 mg | ORAL_TABLET | Freq: Four times a day (QID) | ORAL | 0 refills | Status: DC | PRN
Start: 2020-03-08 — End: 2020-09-03

## 2020-03-08 MED ORDER — ALUM & MAG HYDROXIDE-SIMETH 200-200-20 MG/5ML PO SUSP
30.0000 mL | Freq: Once | ORAL | Status: AC
  Administered 2020-03-08: 30 mL via ORAL
  Filled 2020-03-08: qty 30

## 2020-03-08 MED ORDER — PANTOPRAZOLE SODIUM 20 MG PO TBEC
20.0000 mg | DELAYED_RELEASE_TABLET | Freq: Every day | ORAL | 0 refills | Status: DC
Start: 2020-03-08 — End: 2020-09-03

## 2020-03-08 MED ORDER — SODIUM CHLORIDE 0.9% FLUSH
3.0000 mL | Freq: Once | INTRAVENOUS | Status: AC
  Administered 2020-03-08: 3 mL via INTRAVENOUS

## 2020-03-08 MED ORDER — SODIUM CHLORIDE 0.9 % IV BOLUS
1000.0000 mL | Freq: Once | INTRAVENOUS | Status: AC
  Administered 2020-03-08: 1000 mL via INTRAVENOUS

## 2020-03-08 MED ORDER — LIDOCAINE VISCOUS HCL 2 % MT SOLN
15.0000 mL | Freq: Once | OROMUCOSAL | Status: AC
  Administered 2020-03-08: 15 mL via ORAL
  Filled 2020-03-08: qty 15

## 2020-03-08 MED ORDER — HALOPERIDOL LACTATE 5 MG/ML IJ SOLN
5.0000 mg | Freq: Once | INTRAMUSCULAR | Status: AC
  Administered 2020-03-08: 5 mg via INTRAVENOUS
  Filled 2020-03-08: qty 1

## 2020-03-08 MED ORDER — FAMOTIDINE IN NACL 20-0.9 MG/50ML-% IV SOLN
20.0000 mg | Freq: Once | INTRAVENOUS | Status: AC
  Administered 2020-03-08: 20 mg via INTRAVENOUS
  Filled 2020-03-08: qty 50

## 2020-03-08 MED ORDER — SUCRALFATE 1 GM/10ML PO SUSP
1.0000 g | Freq: Three times a day (TID) | ORAL | 0 refills | Status: DC
Start: 2020-03-08 — End: 2020-09-03

## 2020-03-08 NOTE — ED Provider Notes (Signed)
[facility]   CSN: [id number] Arrival date & time: 03/08/20  1222    History Chief Complaint  Patient presents with  . Chest Pain  . Loss of Consciousness    Christopher Estes is a 34 y.o. male with a history of tobacco abuse, migraines, hyperlipidemia, and insomnia who presents to the ED via EMS for evaluation of N/V/D x 2-3 days, chest pain since 9AM, and syncope shortly PTA. Patient reports too numerous to count episodes of emesis over the past few days, not able to keep anything down, with a few episodes of diarrhea. This AM with his vomiting he developed chest & epigastric pain, feels like pressure, constant no alleviating/aggravating factors. Shortly PTA while vomiting patient became lightheaded and passed out prompting his roommate to call 911. He has had syncopal episodes with emesis in the past. He denies fever, chills, hematemesis, melena, hematochezia, dysuria, dyspnea, leg pain/swelling, hemoptysis, recent surgery/trauma, recent long travel, hormone use, personal hx of cancer, or hx of DVT/PE. Last marijuana use 3 days ago. Per EMS gave 4 mg of IV zofran, patient states he has not had much relief with this.   HPI     Past Medical History:  Diagnosis Date  . Hyperlipidemia   . Insomnia   . Migraine     There are no problems to display for this patient.   Past Surgical History:  Procedure Laterality Date  . INNER EAR SURGERY    . TONSILLECTOMY         Family History  Problem Relation Age of Onset  . Diabetes Mother   . Hypertension Mother   . Asthma Mother   . Cancer Father   . Stroke Father   . Thyroid disease Father     Social History   Tobacco Use  . Smoking status: Current Every Day Smoker    Packs/day: 1.00    Types: Cigarettes  . Smokeless tobacco: Never Used  Vaping Use  . Vaping Use: Never used  Substance Use Topics  . Alcohol use: Yes    Alcohol/week: 24.0 standard drinks    Types: 24 Cans of  beer per week    Comment: weekly  . Drug use: Yes    Types: Marijuana    Home Medications Prior to Admission medications   Medication Sig Start Date End Date Taking? Authorizing Provider  cyclobenzaprine (FLEXERIL) 5 MG tablet Take 1-2 tablets (5-10 mg total) by mouth 2 (two) times daily as needed for muscle spasms. 03/07/20   Wieters, Hallie C, PA-C  ondansetron (ZOFRAN) 4 MG tablet Take 1 tablet (4 mg total) by mouth every 6 (six) hours. Patient not taking: Reported on 02/13/2020 12/18/19   Carroll SageFaulkner, William J, PA-C  predniSONE (DELTASONE) 10 MG tablet Begin with 6 tabs on day 1, 5 tab on day 2, 4 tab on day 3, 3 tab on day 4, 2 tab on day 5, 1 tab on day 6-take with food 03/07/20   Wieters, Ryder SystemHallie C, PA-C  promethazine (PHENERGAN) 25 MG tablet Take 1 tablet (25 mg total) by mouth every 6 (six) hours as needed for nausea or vomiting. 02/13/20   Arthor CaptainHarris, Abigail, PA-C  sucralfate (CARAFATE) 1 g tablet Take 1 tablet (1 g total) by mouth 4 (four) times daily -  with meals and at bedtime for 10 days. 12/18/19 02/13/20  Carroll SageFaulkner, William J, PA-C  metoCLOPramide (REGLAN) 10 MG tablet Take 1 tablet (10 mg total) by mouth every 8 (eight) hours as  needed for nausea or vomiting. Patient not taking: Reported on 11/20/2017 04/18/16 09/16/19  Azalia Bilis, MD    Allergies    Dilaudid [hydromorphone hcl]  Review of Systems   Review of Systems  Constitutional: Negative for chills and fever.  Eyes: Negative for visual disturbance.  Respiratory: Negative for shortness of breath.   Cardiovascular: Positive for chest pain.  Gastrointestinal: Positive for abdominal pain, diarrhea, nausea and vomiting. Negative for blood in stool and constipation.  Genitourinary: Negative for dysuria.  Neurological: Positive for syncope. Negative for weakness and numbness.  All other systems reviewed and are negative.   Physical Exam Updated Vital Signs BP (!) 152/107 (BP Location: Left Arm)   Pulse 73   Temp (!) 97.5 F  (36.4 C) (Oral)   Resp 17   Ht 6' (1.829 m)   Wt 90.7 kg   SpO2 100%   BMI 27.12 kg/m   Physical Exam Vitals and nursing note reviewed.  Constitutional:      Appearance: He is well-developed. He is not toxic-appearing.     Comments: Intermittently dry heaving.   HENT:     Head: Normocephalic and atraumatic.  Eyes:     General:        Right eye: No discharge.        Left eye: No discharge.     Conjunctiva/sclera: Conjunctivae normal.  Cardiovascular:     Rate and Rhythm: Normal rate and regular rhythm.     Pulses:          Radial pulses are 2+ on the right side and 2+ on the left side.  Pulmonary:     Effort: Pulmonary effort is normal. No respiratory distress.     Breath sounds: Normal breath sounds. No wheezing, rhonchi or rales.  Abdominal:     General: There is no distension.     Palpations: Abdomen is soft.     Tenderness: There is abdominal tenderness (epigastrium). There is no guarding or rebound.     Comments: Negative murphys & mcburneys.   Musculoskeletal:     Cervical back: Neck supple.     Right lower leg: No tenderness. No edema.     Left lower leg: No tenderness. No edema.  Skin:    General: Skin is warm and dry.     Findings: No rash.  Neurological:     Mental Status: He is alert.     Comments: Clear speech.  CN II through XII grossly intact.  Sensation and strength grossly tact bilateral upper and lower extremities.  Patient is ambulatory.  Psychiatric:        Behavior: Behavior normal.     ED Results / Procedures / Treatments   Labs (all labs ordered are listed, but only abnormal results are displayed) Labs Reviewed  COMPREHENSIVE METABOLIC PANEL - Abnormal; Notable for the following components:      Result Value   CO2 20 (*)    Glucose, Bld 116 (*)    All other components within normal limits  CBC WITH DIFFERENTIAL/PLATELET - Abnormal; Notable for the following components:   WBC 21.0 (*)    Neutro Abs 16.5 (*)    Monocytes Absolute 1.6 (*)     Abs Immature Granulocytes 0.10 (*)    All other components within normal limits  LIPASE, BLOOD  TROPONIN I (HIGH SENSITIVITY)  TROPONIN I (HIGH SENSITIVITY)    EKG EKG Interpretation  Date/Time:  Monday March 08 2020 12:27:28 EDT Ventricular Rate:  69 PR Interval:  QRS Duration: 100 QT Interval:  403 QTC Calculation: 432 R Axis:   73 Text Interpretation: Sinus rhythm Baseline wander in lead(s) V6 Otherwise normal ECG Confirmed by Geoffery Lyons ([id number]) on 03/08/2020 12:29:44 PM   Radiology DG Chest 2 View  Result Date: 03/08/2020 CLINICAL DATA:  Chest pain. Syncopal episode. Nausea and vomiting since yesterday. EXAM: CHEST - 2 VIEW COMPARISON:  Radiographs 10/03/2017 and 04/01/2017. FINDINGS: The heart size and mediastinal contours are normal. The lungs are clear. There is no pleural effusion or pneumothorax. No acute osseous findings are identified. Telemetry leads overlie the chest. IMPRESSION: Stable chest.  No active cardiopulmonary process. Electronically Signed   By: Carey Bullocks M.D.   On: 03/08/2020 12:55    Procedures Procedures (including critical care time)  Medications Ordered in ED Medications  sodium chloride flush (NS) 0.9 % injection 3 mL (has no administration in time range)    ED Course  I have reviewed the triage vital signs and the nursing notes.  Pertinent labs & imaging results that were available during my care of the patient were reviewed by me and considered in my medical decision making (see chart for details).    MDM Rules/Calculators/A&P                         Patient presents to the ED with complaints of N/V/D for the past 2 days, chest pain since 0900 this AM, and syncope with emesis shortly PTA. Patient is nontoxic, he is actively dry heaving intermittently. Abdomen with mild epigastric tenderness without peritoneal signs.  Negative Murphy's and McBurney's point.   Additional history obtained:  Additional history obtained from nursing  note, chart review, & EMS. Previous records obtained and reviewed -patient has had multiple ER visits for nausea and vomiting, he does have a history of marijuana use, he also has been seen previously for syncope in the setting of vomiting.  EKG: Sinus rhythm Baseline wander in lead(s) V6 Otherwise normal Lab Tests:  I Ordered, reviewed, and interpreted labs, which included:  CBC: Leukocytosis at 21,000, while this is elevated it is fairly similar to prior his prior labs on record with WBCs in the 18,000 range.  No anemia. CMP: Mildly low bicarb, no significant electrolyte derangement.  LFTs and T bili are within normal limits.  Renal function is preserved. Lipase: Within normal limits Troponin: initial WNL  Imaging Studies ordered:  CXR ordered by triage team, I independently visualized and interpreted imaging & am in agreement with radiologist impression - Stable chest.  No active cardiopulmonary process.  On reassessment patient is feeling much better, he is tolerating p.o., he would like to go home.  His repeat abdominal exam remains without peritoneal signs.  He has no focal neurologic deficits.  He does have a notable leukocytosis, however he has a history of similar, otherwise his labs are fairly unremarkable.  I have doubt acute surgical process of the abdomen, low suspicion for pancreatitis, cholecystitis, appendicitis, perforation, or obstruction.   Patient is low risk Wells, PERC negative, doubt pulmonary embolism.  He has no widened mediastinum on his chest x-ray, he has symmetric pulses, doubt dissection.  Chest x-ray is also without pneumonia or pneumothorax. HEAR score 2, low risk, EKG without significant ischemia, initial troponin WNL, delta troponin pending currently.   Patient care signed out to [facility] PA-C at change of shift pending delta troponin, if no significant elevation and patient continues to be able to tolerate PO feel  he can be discharged home.   Portions of this  note were generated with Scientist, clinical (histocompatibility and immunogenetics). Dictation errors may occur despite best attempts at proofreading.  Final Clinical Impression(s) / ED Diagnoses Final diagnoses:  Non-intractable vomiting with nausea, unspecified vomiting type  Syncope, unspecified syncope type  Chest pain, unspecified type    Rx / DC Orders ED Discharge Orders         Ordered    sucralfate (CARAFATE) 1 GM/10ML suspension  3 times daily with meals & bedtime     Discontinue  Reprint     03/08/20 1501    pantoprazole (PROTONIX) 20 MG tablet  Daily     Discontinue  Reprint     03/08/20 1501    promethazine (PHENERGAN) 12.5 MG tablet  Every 6 hours PRN     Discontinue  Reprint     03/08/20 1501           Velita Quirk, Pleas Koch, PA-C 03/08/20 1518    Geoffery Lyons, MD 03/09/20 [phone]

## 2020-03-08 NOTE — ED Provider Notes (Signed)
[facility]    CSN: [id number] Arrival date & time: 03/07/20  1012      History   Chief Complaint Chief Complaint  Patient presents with  . Shoulder Pain    HPI Nicki Senna is a 34 y.o. male presenting today for evaluation of right shoulder pain.  Patient reports that over the past month he has had a lot of pain in his right shoulder with most movements.  Reports pain mainly in his back.  He denies specific injury or trauma.  Denies history of prior shoulder injuries.  He has had biceps tendon tear previously but symptoms feel different.  He has been taking NSAIDs without relief.  Did have some paresthesias into right arm today.  Denies chest pain.  HPI  Past Medical History:  Diagnosis Date  . Hyperlipidemia   . Insomnia   . Migraine     There are no problems to display for this patient.   Past Surgical History:  Procedure Laterality Date  . INNER EAR SURGERY    . TONSILLECTOMY         Home Medications    Prior to Admission medications   Medication Sig Start Date End Date Taking? Authorizing Provider  cyclobenzaprine (FLEXERIL) 5 MG tablet Take 1-2 tablets (5-10 mg total) by mouth 2 (two) times daily as needed for muscle spasms. 03/07/20   Kalai Baca C, PA-C  pantoprazole (PROTONIX) 20 MG tablet Take 1 tablet (20 mg total) by mouth daily. 03/08/20   Petrucelli, Samantha R, PA-C  predniSONE (DELTASONE) 10 MG tablet Begin with 6 tabs on day 1, 5 tab on day 2, 4 tab on day 3, 3 tab on day 4, 2 tab on day 5, 1 tab on day 6-take with food 03/07/20   Treyana Sturgell, [employer] C, PA-C  promethazine (PHENERGAN) 12.5 MG tablet Take 1 tablet (12.5 mg total) by mouth every 6 (six) hours as needed for nausea or vomiting. 03/08/20   Petrucelli, Samantha R, PA-C  sucralfate (CARAFATE) 1 GM/10ML suspension Take 10 mLs (1 g total) by mouth 4 (four) times daily -  with meals and at bedtime. 03/08/20   Petrucelli, Samantha R, PA-C  metoCLOPramide (REGLAN) 10 MG tablet Take 1 tablet (10 mg  total) by mouth every 8 (eight) hours as needed for nausea or vomiting. Patient not taking: Reported on 11/20/2017 04/18/16 09/16/19  Azalia Bilis, MD    Family History Family History  Problem Relation Age of Onset  . Diabetes Mother   . Hypertension Mother   . Asthma Mother   . Cancer Father   . Stroke Father   . Thyroid disease Father     Social History Social History   Tobacco Use  . Smoking status: Current Every Day Smoker    Packs/day: 1.00    Types: Cigarettes  . Smokeless tobacco: Never Used  Vaping Use  . Vaping Use: Never used  Substance Use Topics  . Alcohol use: Yes    Alcohol/week: 24.0 standard drinks    Types: 24 Cans of beer per week    Comment: weekly  . Drug use: Yes    Types: Marijuana     Allergies   Dilaudid [hydromorphone hcl]   Review of Systems Review of Systems  Constitutional: Negative for fatigue and fever.  Eyes: Negative for redness, itching and visual disturbance.  Respiratory: Negative for shortness of breath.   Cardiovascular: Negative for chest pain and leg swelling.  Gastrointestinal: Negative for nausea and vomiting.  Musculoskeletal: Positive for  arthralgias and myalgias.  Skin: Negative for color change, rash and wound.  Neurological: Negative for dizziness, syncope, weakness, light-headedness and headaches.     Physical Exam Triage Vital Signs ED Triage Vitals  Enc Vitals Group     BP 03/07/20 1030 138/89     Pulse Rate 03/07/20 1030 66     Resp 03/07/20 1030 18     Temp 03/07/20 1030 98.3 F (36.8 C)     Temp Source 03/07/20 1030 Oral     SpO2 03/07/20 1030 100 %     Weight --      Height --      Head Circumference --      Peak Flow --      Pain Score 03/07/20 1034 9     Pain Loc --      Pain Edu? --      Excl. in GC? --    No data found.  Updated Vital Signs BP 138/89 (BP Location: Left Arm)   Pulse 66   Temp 98.3 F (36.8 C) (Oral)   Resp 18   SpO2 100%   Visual Acuity Right Eye Distance:   Left  Eye Distance:   Bilateral Distance:    Right Eye Near:   Left Eye Near:    Bilateral Near:     Physical Exam Vitals and nursing note reviewed.  Constitutional:      Appearance: He is well-developed.     Comments: No acute distress  HENT:     Head: Normocephalic and atraumatic.     Nose: Nose normal.  Eyes:     Conjunctiva/sclera: Conjunctivae normal.  Cardiovascular:     Rate and Rhythm: Normal rate.  Pulmonary:     Effort: Pulmonary effort is normal. No respiratory distress.  Abdominal:     General: There is no distension.  Musculoskeletal:        General: Normal range of motion.     Cervical back: Neck supple.     Comments: Right shoulder: No obvious swelling deformity or discoloration, nontender to palpation along clavicle, AC joint or scapular spine, tenderness to palpation throughout right trapezius and periscapular musculature of right thoracic area, nontender to palpation of right anterior chest Strength 5/5 and equal bilaterally in all directions of shoulder, grip strength 5/vertical bilaterally, radial pulse 2+ Negative liftoff, negative resisted external rotation, negative empty can  Skin:    General: Skin is warm and dry.  Neurological:     Mental Status: He is alert and oriented to person, place, and time.      UC Treatments / Results  Labs (all labs ordered are listed, but only abnormal results are displayed) Labs Reviewed - No data to display  EKG   Radiology DG Chest 2 View  Result Date: 03/08/2020 CLINICAL DATA:  Chest pain. Syncopal episode. Nausea and vomiting since yesterday. EXAM: CHEST - 2 VIEW COMPARISON:  Radiographs 10/03/2017 and 04/01/2017. FINDINGS: The heart size and mediastinal contours are normal. The lungs are clear. There is no pleural effusion or pneumothorax. No acute osseous findings are identified. Telemetry leads overlie the chest. IMPRESSION: Stable chest.  No active cardiopulmonary process. Electronically Signed   By: Carey Bullocks M.D.   On: 03/08/2020 12:55    Procedures Procedures (including critical care time)  Medications Ordered in UC Medications - No data to display  Initial Impression / Assessment and Plan / UC Course  I have reviewed the triage vital signs and the nursing notes.  Pertinent labs &  imaging results that were available during my care of the patient were reviewed by me and considered in my medical decision making (see chart for details).     No mechanism of injury, do not suspect acute bony abnormality.  Tenderness mainly throughout thoracic area/periscapular musculature.  Suspect most likely muscular straining, does have some pain with special testing, but no specific weakness.  Do not suspect rotator cuff tear, possible tendinopathy.  Has been using NSAIDs without relief, will do trial of prednisone x6 days, muscle relaxers.  Discussed following up with physical therapy given symptoms x1 month, expressed concern over cost as he does not have insurance.  Provided at home exercises to try.  Follow-up with sports medicine if persisting.  Discussed strict return precautions. Patient verbalized understanding and is agreeable with plan.  Final Clinical Impressions(s) / UC Diagnoses   Final diagnoses:  Acute pain of right shoulder  Strain of right trapezius muscle, initial encounter     Discharge Instructions     Begin prednisone course over the next 6 days-she will begin with 6 tabs/60 mg on day 1, decrease by 1 tablet each day until complete-6, 5, 4, 3, 2, 1.  Take with food and in the morning if you are able You may use flexeril as needed to help with pain. This is a muscle relaxer and causes sedation- please use only at bedtime or when you will be home and not have to drive/work Alternate ice and heat Gentle stretching/exercises for shoulder at home-see attached  Please follow-up with sports medicine if symptoms persisting or worsening   ED Prescriptions    Medication Sig  Dispense Auth. Provider   predniSONE (DELTASONE) 10 MG tablet Begin with 6 tabs on day 1, 5 tab on day 2, 4 tab on day 3, 3 tab on day 4, 2 tab on day 5, 1 tab on day 6-take with food 21 tablet Shadee Montoya C, PA-C   cyclobenzaprine (FLEXERIL) 5 MG tablet Take 1-2 tablets (5-10 mg total) by mouth 2 (two) times daily as needed for muscle spasms. 24 tablet Jaclyn Carew, [place] C, PA-C     PDMP not reviewed this encounter.   Lew Dawes, [place] 03/08/20 [phone]

## 2020-03-08 NOTE — Discharge Instructions (Addendum)
You were seen in the emergency department today for chest pain, passing out, and nausea/vomiting.  Your labs were overall similar to prior labs you have had done including your high white blood cell count, please be sure to have this followed up by your primary care provider.  Your chest x-ray was normal.  Your heart enzymes did not show signs of a heart attack.  We are sending you home with Protonix to take daily in the morning prior to meals as well as Carafate to take prior to each meal and prior to bedtime to help with stomach acidity.  We are sending her with Phenergan to take every 6 hours as needed for nausea and vomiting.  We have prescribed you new medication(s) today. Discuss the medications prescribed today with your pharmacist as they can have adverse effects and interactions with your other medicines including over the counter and prescribed medications. Seek medical evaluation if you start to experience new or abnormal symptoms after taking one of these medicines, seek care immediately if you start to experience difficulty breathing, feeling of your throat closing, facial swelling, or rash as these could be indications of a more serious allergic reaction  Please follow with a primary care provider within 3 days.  Return to the ER for new or worsening symptoms include not limited to return of pain, recurrence of passing out, inability to keep fluids down, blood in vomit or stool, or any other concerns.

## 2020-03-08 NOTE — ED Triage Notes (Signed)
Patient arrived via EMS; c/o chest pain and reported passing out. Reported patient has had N/V since yesterday and now has chest pain. Reported recently started prednisone d/t right rotator cuff issue.Patient endorsed he was already on the ground when he passed out.

## 2020-04-05 ENCOUNTER — Emergency Department (HOSPITAL_COMMUNITY): Payer: Self-pay

## 2020-04-05 ENCOUNTER — Encounter (HOSPITAL_COMMUNITY): Payer: Self-pay

## 2020-04-05 ENCOUNTER — Emergency Department (HOSPITAL_COMMUNITY)
Admission: EM | Admit: 2020-04-05 | Discharge: 2020-04-06 | Disposition: A | Discharge status: Home / Self Care | Payer: Self-pay | Attending: Emergency Medicine | Admitting: Emergency Medicine

## 2020-04-05 ENCOUNTER — Other Ambulatory Visit: Payer: Self-pay

## 2020-04-05 ENCOUNTER — Emergency Department (HOSPITAL_COMMUNITY)
Admission: EM | Admit: 2020-04-05 | Discharge: 2020-04-05 | Disposition: A | Discharge status: Home / Self Care | Payer: Self-pay | Attending: Emergency Medicine | Admitting: Emergency Medicine

## 2020-04-05 DIAGNOSIS — K760 Fatty (change of) liver, not elsewhere classified: Secondary | ICD-10-CM | POA: Insufficient documentation

## 2020-04-05 DIAGNOSIS — R1110 Vomiting, unspecified: Secondary | ICD-10-CM | POA: Insufficient documentation

## 2020-04-05 DIAGNOSIS — K5730 Diverticulosis of large intestine without perforation or abscess without bleeding: Secondary | ICD-10-CM | POA: Insufficient documentation

## 2020-04-05 DIAGNOSIS — K5790 Diverticulosis of intestine, part unspecified, without perforation or abscess without bleeding: Secondary | ICD-10-CM

## 2020-04-05 DIAGNOSIS — Z5321 Procedure and treatment not carried out due to patient leaving prior to being seen by health care provider: Secondary | ICD-10-CM | POA: Insufficient documentation

## 2020-04-05 DIAGNOSIS — R112 Nausea with vomiting, unspecified: Secondary | ICD-10-CM

## 2020-04-05 DIAGNOSIS — R109 Unspecified abdominal pain: Secondary | ICD-10-CM | POA: Insufficient documentation

## 2020-04-05 DIAGNOSIS — R197 Diarrhea, unspecified: Secondary | ICD-10-CM

## 2020-04-05 DIAGNOSIS — F17210 Nicotine dependence, cigarettes, uncomplicated: Secondary | ICD-10-CM | POA: Insufficient documentation

## 2020-04-05 DIAGNOSIS — Z79899 Other long term (current) drug therapy: Secondary | ICD-10-CM | POA: Insufficient documentation

## 2020-04-05 LAB — CBC
HCT: 50.1 % (ref 39.0–52.0)
Hemoglobin: 18 g/dL — ABNORMAL HIGH (ref 13.0–17.0)
MCH: 32.8 pg (ref 26.0–34.0)
MCHC: 35.9 g/dL (ref 30.0–36.0)
MCV: 91.3 fL (ref 80.0–100.0)
Platelets: 457 10*3/uL — ABNORMAL HIGH (ref 150–400)
RBC: 5.49 MIL/uL (ref 4.22–5.81)
RDW: 13.4 % (ref 11.5–15.5)
WBC: 21.5 10*3/uL — ABNORMAL HIGH (ref 4.0–10.5)
nRBC: 0 % (ref 0.0–0.2)

## 2020-04-05 LAB — COMPREHENSIVE METABOLIC PANEL
ALT: 34 U/L (ref 0–44)
AST: 29 U/L (ref 15–41)
Albumin: 4.9 g/dL (ref 3.5–5.0)
Alkaline Phosphatase: 67 U/L (ref 38–126)
Anion gap: 17 — ABNORMAL HIGH (ref 5–15)
BUN: 10 mg/dL (ref 6–20)
CO2: 20 mmol/L — ABNORMAL LOW (ref 22–32)
Calcium: 10.1 mg/dL (ref 8.9–10.3)
Chloride: 104 mmol/L (ref 98–111)
Creatinine, Ser: 0.84 mg/dL (ref 0.61–1.24)
GFR calc Af Amer: >60 mL/min (ref >60)
GFR calc non Af Amer: >60 mL/min (ref >60)
Glucose, Bld: 130 mg/dL — ABNORMAL HIGH (ref 70–99)
Potassium: 3.9 mmol/L (ref 3.5–5.1)
Sodium: 141 mmol/L (ref 135–145)
Total Bilirubin: 1.3 mg/dL — ABNORMAL HIGH (ref 0.3–1.2)
Total Protein: 8.1 g/dL (ref 6.5–8.1)

## 2020-04-05 LAB — LIPASE, BLOOD: Lipase: 21 U/L (ref 11–51)

## 2020-04-05 MED ORDER — DICYCLOMINE HCL 10 MG/ML IM SOLN
20.0000 mg | Freq: Once | INTRAMUSCULAR | Status: AC
  Administered 2020-04-05: 20 mg via INTRAMUSCULAR
  Filled 2020-04-05: qty 2

## 2020-04-05 MED ORDER — ALUM & MAG HYDROXIDE-SIMETH 200-200-20 MG/5ML PO SUSP
30.0000 mL | Freq: Once | ORAL | Status: AC
  Administered 2020-04-05: 30 mL via ORAL
  Filled 2020-04-05: qty 30

## 2020-04-05 MED ORDER — LIDOCAINE VISCOUS HCL 2 % MT SOLN
15.0000 mL | Freq: Once | OROMUCOSAL | Status: AC
  Administered 2020-04-05: 15 mL via ORAL
  Filled 2020-04-05: qty 15

## 2020-04-05 MED ORDER — ONDANSETRON 4 MG PO TBDP
4.0000 mg | ORAL_TABLET | Freq: Once | ORAL | Status: AC | PRN
  Administered 2020-04-05: 4 mg via ORAL
  Filled 2020-04-05: qty 1

## 2020-04-05 MED ORDER — IOHEXOL 300 MG/ML  SOLN
100.0000 mL | Freq: Once | INTRAMUSCULAR | Status: AC | PRN
  Administered 2020-04-06: 100 mL via INTRAVENOUS

## 2020-04-05 MED ORDER — SODIUM CHLORIDE 0.9 % IV BOLUS
1000.0000 mL | Freq: Once | INTRAVENOUS | Status: AC
  Administered 2020-04-05: 1000 mL via INTRAVENOUS

## 2020-04-05 MED ORDER — ONDANSETRON HCL 4 MG/2ML IJ SOLN
4.0000 mg | Freq: Once | INTRAMUSCULAR | Status: AC
  Administered 2020-04-05: 4 mg via INTRAVENOUS
  Filled 2020-04-05: qty 2

## 2020-04-05 NOTE — ED Provider Notes (Signed)
[facility]-EMERGENCY DEPT Provider Note   CSN: [id number] Arrival date & time: 04/05/20  2049     History Chief Complaint  Patient presents with  . Emesis  . Abdominal Pain    Christopher Estes is a 34 y.o. male.  HPI    Pt is a 34 y/o male with a h/o HLD, insomnia, migraine, who presents to the ED today for eval of abd pain, NVD. States that sxs have been ongoing for several days. Has h/o similar sxs in the past. Denies fevers or urinary sxs.  Denies marijuana use for the last two weeks. States he drinks 1-2 24 oz drinks every few days .  Past Medical History:  Diagnosis Date  . Hyperlipidemia   . Insomnia   . Migraine     There are no problems to display for this patient.   Past Surgical History:  Procedure Laterality Date  . INNER EAR SURGERY    . TONSILLECTOMY         Family History  Problem Relation Age of Onset  . Diabetes Mother   . Hypertension Mother   . Asthma Mother   . Cancer Father   . Stroke Father   . Thyroid disease Father     Social History   Tobacco Use  . Smoking status: Current Every Day Smoker    Packs/day: 1.00    Types: Cigarettes  . Smokeless tobacco: Never Used  Vaping Use  . Vaping Use: Every day  . Substances: Nicotine, Flavoring  Substance Use Topics  . Alcohol use: Yes    Alcohol/week: 24.0 standard drinks    Types: 24 Cans of beer per week    Comment: weekly  . Drug use: Yes    Types: Marijuana    Home Medications Prior to Admission medications   Medication Sig Start Date End Date Taking? Authorizing Provider  cyclobenzaprine (FLEXERIL) 5 MG tablet Take 1-2 tablets (5-10 mg total) by mouth 2 (two) times daily as needed for muscle spasms. 03/07/20   Wieters, Hallie C, PA-C  dicyclomine (BENTYL) 20 MG tablet Take 1 tablet (20 mg total) by mouth 2 (two) times daily. 04/06/20   Aimee Heldman S, PA-C  famotidine (PEPCID) 20 MG tablet Take 1 tablet (20 mg total) by mouth 2 (two) times daily. 04/06/20    Marionette Meskill S, PA-C  ondansetron (ZOFRAN) 4 MG tablet Take 1 tablet (4 mg total) by mouth every 6 (six) hours. 04/06/20   Turki Tapanes S, PA-C  pantoprazole (PROTONIX) 20 MG tablet Take 1 tablet (20 mg total) by mouth daily. 03/08/20   Petrucelli, Samantha R, PA-C  predniSONE (DELTASONE) 10 MG tablet Begin with 6 tabs on day 1, 5 tab on day 2, 4 tab on day 3, 3 tab on day 4, 2 tab on day 5, 1 tab on day 6-take with food 03/07/20   Wieters, [employer] C, PA-C  promethazine (PHENERGAN) 12.5 MG tablet Take 1 tablet (12.5 mg total) by mouth every 6 (six) hours as needed for nausea or vomiting. 03/08/20   Petrucelli, Samantha R, PA-C  sucralfate (CARAFATE) 1 GM/10ML suspension Take 10 mLs (1 g total) by mouth 4 (four) times daily -  with meals and at bedtime. 03/08/20   Petrucelli, Samantha R, PA-C  metoCLOPramide (REGLAN) 10 MG tablet Take 1 tablet (10 mg total) by mouth every 8 (eight) hours as needed for nausea or vomiting. Patient not taking: Reported on 11/20/2017 04/18/16 09/16/19  Azalia Bilis, MD    Allergies  Dilaudid [hydromorphone hcl]  Review of Systems   Review of Systems  Constitutional: Negative for chills and fever.  HENT: Negative for ear pain and sore throat.   Eyes: Negative for visual disturbance.  Respiratory: Negative for cough and shortness of breath.   Cardiovascular: Negative for chest pain.  Gastrointestinal: Positive for abdominal pain, diarrhea, nausea and vomiting.  Genitourinary: Negative for dysuria and hematuria.  Musculoskeletal: Negative for back pain.  Skin: Negative for rash.  Neurological: Negative for headaches.  All other systems reviewed and are negative.   Physical Exam Updated Vital Signs BP 133/74   Pulse 95   Temp 98.6 F (37 C)   Resp 18   Ht 6' (1.829 m)   Wt 90.7 kg   SpO2 99%   BMI 27.12 kg/m   Physical Exam Vitals and nursing note reviewed.  Constitutional:      Appearance: He is well-developed.  HENT:     Head: Normocephalic and  atraumatic.  Eyes:     Conjunctiva/sclera: Conjunctivae normal.  Cardiovascular:     Rate and Rhythm: Normal rate and regular rhythm.     Heart sounds: Normal heart sounds. No murmur heard.   Pulmonary:     Effort: Pulmonary effort is normal. No respiratory distress.     Breath sounds: Normal breath sounds. No wheezing, rhonchi or rales.  Abdominal:     General: Bowel sounds are normal.     Palpations: Abdomen is soft.     Tenderness: There is abdominal tenderness in the epigastric area. There is no guarding or rebound.  Musculoskeletal:     Cervical back: Neck supple.  Skin:    General: Skin is warm and dry.  Neurological:     Mental Status: He is alert.     ED Results / Procedures / Treatments   Labs (all labs ordered are listed, but only abnormal results are displayed) Labs Reviewed - No data to display  EKG None  Radiology CT ABDOMEN PELVIS W CONTRAST  Result Date: 04/06/2020 CLINICAL DATA:  Central abdominal pain and vomiting for 1.5 days. EXAM: CT ABDOMEN AND PELVIS WITH CONTRAST TECHNIQUE: Multidetector CT imaging of the abdomen and pelvis was performed using the standard protocol following bolus administration of intravenous contrast. CONTRAST:  OMNIPAQUE IOHEXOL 300 MG/ML  SOLN COMPARISON:  12/29/2019 FINDINGS: Lower chest: No acute abnormality. Hepatobiliary: Mild diffuse fatty infiltration of the liver. No focal lesions. Portal veins are patent. Gallbladder and bile ducts are unremarkable. Pancreas: Unremarkable. No pancreatic ductal dilatation or surrounding inflammatory changes. Spleen: Normal in size without focal abnormality. Adrenals/Urinary Tract: Adrenal glands are unremarkable. Kidneys are normal, without renal calculi, focal lesion, or hydronephrosis. Bladder is unremarkable. Stomach/Bowel: Stomach, small bowel, and colon are not abnormally distended. Decompression limits evaluation of bowel wall but there appears to be some small bowel wall thickening in  left upper quadrant jejunum possibly indicating enteritis. Scattered colonic diverticula without evidence of diverticulitis. The appendix is normal. Vascular/Lymphatic: No significant vascular findings are present. No enlarged abdominal or pelvic lymph nodes. Reproductive: Prostate is unremarkable. Other: No abdominal wall hernia or abnormality. No abdominopelvic ascites. Musculoskeletal: No acute or significant osseous findings. IMPRESSION: 1. Mild diffuse fatty infiltration of the liver. 2. Suggestion of small bowel wall thickening in left upper quadrant jejunum possibly indicating enteritis. 3. Scattered colonic diverticula without evidence of diverticulitis. Electronically Signed   By: Burman Nieves M.D.   On: 04/06/2020 00:26    Procedures Procedures (including critical care time)  Medications Ordered in  ED Medications  sodium chloride 0.9 % bolus 1,000 mL (0 mLs Intravenous Stopped 04/06/20 0038)  ondansetron (ZOFRAN) injection 4 mg (4 mg Intravenous Given 04/05/20 2256)  alum & mag hydroxide-simeth (MAALOX/MYLANTA) 200-200-20 MG/5ML suspension 30 mL (30 mLs Oral Given 04/05/20 2256)    And  lidocaine (XYLOCAINE) 2 % viscous mouth solution 15 mL (15 mLs Oral Given 04/05/20 2256)  dicyclomine (BENTYL) injection 20 mg (20 mg Intramuscular Given 04/05/20 2256)  iohexol (OMNIPAQUE) 300 MG/ML solution 100 mL (100 mLs Intravenous Contrast Given 04/06/20 0011)    ED Course  I have reviewed the triage vital signs and the nursing notes.  Pertinent labs & imaging results that were available during my care of the patient were reviewed by me and considered in my medical decision making (see chart for details).    MDM Rules/Calculators/A&P                          Patient with symptoms consistent with viral gastroenteritis.  CBC with leukocytosis, however this is consistent with prior labs. CMP with no singinficant electrolyte derangement. Normal Cr and LFTs. Normal lipase. CT abd/pelvis with enteritis.  Vitals are stable, no fever.  No signs of dehydration, tolerating PO fluids > 6 oz.  Lungs are clear.  No focal abdominal pain, no concern for appendicitis, cholecystitis, pancreatitis, ruptured viscus, UTI, kidney stone, or any other abdominal etiology.  Supportive therapy indicated with return if symptoms worsen.  Patient counseled.  Final Clinical Impression(s) / ED Diagnoses Final diagnoses:  Nausea vomiting and diarrhea  Fatty liver  Diverticulosis    Rx / DC Orders ED Discharge Orders         Ordered    ondansetron (ZOFRAN) 4 MG tablet  Every 6 hours        04/06/20 0052    famotidine (PEPCID) 20 MG tablet  2 times daily        04/06/20 0052    dicyclomine (BENTYL) 20 MG tablet  2 times daily        04/06/20 0052           Karrie Meres, PA-C 04/06/20 0102    Palumbo, April, MD 04/06/20 1914

## 2020-04-05 NOTE — ED Notes (Signed)
called patient name to recheck vitals and no one responded

## 2020-04-05 NOTE — ED Notes (Signed)
Called for vital sign recheck with no response

## 2020-04-05 NOTE — ED Notes (Signed)
Patient refused vitals and patient cursing upset because he wants a room and not his vital sign recheck

## 2020-04-05 NOTE — ED Triage Notes (Signed)
Per EMS- patient c/o vomiting and abdominal pain. Patient has a history x 3 years of the same. EMS stated that the patient vomited x 1 in the truck and faked passing out x 2.

## 2020-04-05 NOTE — ED Notes (Signed)
Pt requested to speak to CN, upon arriving to speak with him, he was upset due to no one providing water or ice upon his request. Writer tried to explain due to his complaint of abd pain, nausea and vomiting we are requested to have pts evaluated by EDP prior to providing liquids. He called this "torchure chamber" and no one cares around this place. He then stated he is going to walk around in the hospital until he finds something to drink. He is aware if he leaves the waiting area and he is called for a room, he will have to wait as we will take the next pt and he will go back in line to be called. He then proceeded to get up and walk away.

## 2020-04-05 NOTE — ED Triage Notes (Signed)
Patient arrived stating he has had been having central abdominal pain and vomiting for a day a half, reports leaving today and had some blood in his vomit. Patient had bloodwork done earlier today at [facility]

## 2020-04-06 ENCOUNTER — Emergency Department (HOSPITAL_COMMUNITY): Payer: Self-pay

## 2020-04-06 MED ORDER — ONDANSETRON HCL 4 MG PO TABS: 4.0000 mg | ORAL_TABLET | Freq: Four times a day (QID) | ORAL | 0 refills | Status: DC

## 2020-04-06 MED ORDER — DICYCLOMINE HCL 20 MG PO TABS: 20.0000 mg | ORAL_TABLET | Freq: Two times a day (BID) | ORAL | 0 refills | Status: DC

## 2020-04-06 MED ORDER — FAMOTIDINE 20 MG PO TABS: 20.0000 mg | ORAL_TABLET | Freq: Two times a day (BID) | ORAL | 0 refills | Status: DC

## 2020-04-06 NOTE — Discharge Instructions (Addendum)
You were given a prescription for zofran to help with your nausea. Please take as directed.  There were some incidental findings of fatty liver disease and diverticulosis on your CT scan of your abdomen. There is additional information on your discharge paperwork in regards to these diagnosis. You need to follow up with your regular doctor about these findings.   Please follow up with your primary doctor within the next 5-7 days.  If you do not have a primary care provider, information for a healthcare clinic has been provided for you to make arrangements for follow up care. Please return to the ER sooner if you have any new or worsening symptoms, or if you have any of the following symptoms:  Abdominal pain that does not go away.  You have a fever.  You keep throwing up (vomiting).  The pain is felt only in portions of the abdomen. Pain in the right side could possibly be appendicitis. In an adult, pain in the left lower portion of the abdomen could be colitis or diverticulitis.  You pass bloody or black tarry stools.  There is bright red blood in the stool.  The constipation stays for more than 4 days.  There is belly (abdominal) or rectal pain.  You do not seem to be getting better.  You have any questions or concerns.

## 2020-04-06 NOTE — ED Notes (Addendum)
Gave pt some ginger ale at this time to sip on. No distress noted.

## 2020-05-01 ENCOUNTER — Encounter (HOSPITAL_COMMUNITY): Payer: Self-pay | Admitting: *Deleted

## 2020-05-01 ENCOUNTER — Emergency Department (HOSPITAL_COMMUNITY)
Admission: EM | Admit: 2020-05-01 | Discharge: 2020-05-01 | Disposition: A | Discharge status: Home / Self Care | Payer: Self-pay | Attending: Emergency Medicine | Admitting: Emergency Medicine

## 2020-05-01 ENCOUNTER — Other Ambulatory Visit: Payer: Self-pay

## 2020-05-01 ENCOUNTER — Emergency Department (HOSPITAL_COMMUNITY): Payer: Self-pay

## 2020-05-01 DIAGNOSIS — R112 Nausea with vomiting, unspecified: Secondary | ICD-10-CM | POA: Insufficient documentation

## 2020-05-01 DIAGNOSIS — R079 Chest pain, unspecified: Secondary | ICD-10-CM | POA: Insufficient documentation

## 2020-05-01 DIAGNOSIS — R197 Diarrhea, unspecified: Secondary | ICD-10-CM | POA: Insufficient documentation

## 2020-05-01 DIAGNOSIS — Z5321 Procedure and treatment not carried out due to patient leaving prior to being seen by health care provider: Secondary | ICD-10-CM | POA: Insufficient documentation

## 2020-05-01 LAB — CBC
HCT: 48.1 % (ref 39.0–52.0)
Hemoglobin: 16.8 g/dL (ref 13.0–17.0)
MCH: 31.8 pg (ref 26.0–34.0)
MCHC: 34.9 g/dL (ref 30.0–36.0)
MCV: 90.9 fL (ref 80.0–100.0)
Platelets: 406 10*3/uL — ABNORMAL HIGH (ref 150–400)
RBC: 5.29 MIL/uL (ref 4.22–5.81)
RDW: 13.1 % (ref 11.5–15.5)
WBC: 19.5 10*3/uL — ABNORMAL HIGH (ref 4.0–10.5)
nRBC: 0 % (ref 0.0–0.2)

## 2020-05-01 LAB — BASIC METABOLIC PANEL
Anion gap: 17 — ABNORMAL HIGH (ref 5–15)
BUN: 11 mg/dL (ref 6–20)
CO2: 21 mmol/L — ABNORMAL LOW (ref 22–32)
Calcium: 9.9 mg/dL (ref 8.9–10.3)
Chloride: 99 mmol/L (ref 98–111)
Creatinine, Ser: 0.95 mg/dL (ref 0.61–1.24)
GFR calc Af Amer: >60 mL/min (ref >60)
GFR calc non Af Amer: >60 mL/min (ref >60)
Glucose, Bld: 120 mg/dL — ABNORMAL HIGH (ref 70–99)
Potassium: 3.7 mmol/L (ref 3.5–5.1)
Sodium: 137 mmol/L (ref 135–145)

## 2020-05-01 LAB — TROPONIN I (HIGH SENSITIVITY): Troponin I (High Sensitivity): 4 ng/L (ref <18)

## 2020-05-01 NOTE — ED Triage Notes (Signed)
Pt arrives from home w/ left sided chest pain, started several hours ago. Newly diagnosed with diverticulitis, has had n/v/d. Pain worse with lifting his arm up, describes as a pressure. 4 ASA, 1 nitro with no improvement in pain. 20g left hand. 12 lead WNL. 150/90, hr 80, resp 16, 98% RA.

## 2020-05-01 NOTE — ED Triage Notes (Signed)
Pt reports he has had vomiting for about a week. Pt says that tonight he thinks he passed out going down the steps, then started having some chest pain.

## 2020-05-01 NOTE — ED Notes (Signed)
Patient states his ride cant pick him up after 11pm and he is leaving. Requesting IV be removed. LWBS

## 2020-08-06 ENCOUNTER — Emergency Department (HOSPITAL_COMMUNITY)
Admission: EM | Admit: 2020-08-06 | Discharge: 2020-08-06 | Disposition: A | Discharge status: Home / Self Care | Payer: Self-pay | Attending: Emergency Medicine | Admitting: Emergency Medicine

## 2020-08-06 ENCOUNTER — Encounter (HOSPITAL_COMMUNITY): Payer: Self-pay | Admitting: Emergency Medicine

## 2020-08-06 DIAGNOSIS — Z5321 Procedure and treatment not carried out due to patient leaving prior to being seen by health care provider: Secondary | ICD-10-CM | POA: Insufficient documentation

## 2020-08-06 DIAGNOSIS — R519 Headache, unspecified: Secondary | ICD-10-CM | POA: Insufficient documentation

## 2020-08-06 DIAGNOSIS — R531 Weakness: Secondary | ICD-10-CM | POA: Insufficient documentation

## 2020-08-06 DIAGNOSIS — F8081 Childhood onset fluency disorder: Secondary | ICD-10-CM | POA: Insufficient documentation

## 2020-08-06 LAB — COMPREHENSIVE METABOLIC PANEL
ALT: 43 U/L (ref 0–44)
ALT: 41 U/L (ref 0–44)
AST: 32 U/L (ref 15–41)
AST: 29 U/L (ref 15–41)
Albumin: 4.5 g/dL (ref 3.5–5.0)
Albumin: 4.1 g/dL (ref 3.5–5.0)
Alkaline Phosphatase: 51 U/L (ref 38–126)
Alkaline Phosphatase: 47 U/L (ref 38–126)
Anion gap: 10 (ref 5–15)
Anion gap: 12 (ref 5–15)
BUN: 16 mg/dL (ref 6–20)
BUN: 13 mg/dL (ref 6–20)
CO2: 23 mmol/L (ref 22–32)
CO2: 21 mmol/L — ABNORMAL LOW (ref 22–32)
Calcium: 9.1 mg/dL (ref 8.9–10.3)
Calcium: 9.3 mg/dL (ref 8.9–10.3)
Chloride: 103 mmol/L (ref 98–111)
Chloride: 105 mmol/L (ref 98–111)
Creatinine, Ser: 0.92 mg/dL (ref 0.61–1.24)
Creatinine, Ser: 0.81 mg/dL (ref 0.61–1.24)
GFR, Estimated: >60 mL/min (ref >60)
GFR, Estimated: >60 mL/min (ref >60)
Glucose, Bld: 99 mg/dL (ref 70–99)
Glucose, Bld: 100 mg/dL — ABNORMAL HIGH (ref 70–99)
Potassium: 3.6 mmol/L (ref 3.5–5.1)
Potassium: 3.7 mmol/L (ref 3.5–5.1)
Sodium: 136 mmol/L (ref 135–145)
Sodium: 138 mmol/L (ref 135–145)
Total Bilirubin: 1.1 mg/dL (ref 0.3–1.2)
Total Bilirubin: 1.2 mg/dL (ref 0.3–1.2)
Total Protein: 7.7 g/dL (ref 6.5–8.1)
Total Protein: 7 g/dL (ref 6.5–8.1)

## 2020-08-06 LAB — CBC
HCT: 47.4 % (ref 39.0–52.0)
HCT: 47.8 % (ref 39.0–52.0)
Hemoglobin: 16.4 g/dL (ref 13.0–17.0)
Hemoglobin: 16.3 g/dL (ref 13.0–17.0)
MCH: 32.3 pg (ref 26.0–34.0)
MCH: 31.8 pg (ref 26.0–34.0)
MCHC: 34.6 g/dL (ref 30.0–36.0)
MCHC: 34.1 g/dL (ref 30.0–36.0)
MCV: 93.3 fL (ref 80.0–100.0)
MCV: 93.4 fL (ref 80.0–100.0)
Platelets: 319 10*3/uL (ref 150–400)
Platelets: 326 10*3/uL (ref 150–400)
RBC: 5.08 MIL/uL (ref 4.22–5.81)
RBC: 5.12 MIL/uL (ref 4.22–5.81)
RDW: 13.1 % (ref 11.5–15.5)
RDW: 12.9 % (ref 11.5–15.5)
WBC: 7.9 10*3/uL (ref 4.0–10.5)
WBC: 12 10*3/uL — ABNORMAL HIGH (ref 4.0–10.5)
nRBC: 0 % (ref 0.0–0.2)
nRBC: 0 % (ref 0.0–0.2)

## 2020-08-06 LAB — CBG MONITORING, ED: Glucose-Capillary: 87 mg/dL (ref 70–99)

## 2020-08-06 NOTE — ED Notes (Signed)
Called pt x3 for vitals, no response. 

## 2020-08-06 NOTE — ED Triage Notes (Signed)
Pt back to the ED just left wled 2 hrs ago , states that he has a h/a and some with some right sided weakness

## 2020-08-06 NOTE — ED Triage Notes (Signed)
Per pt, states he fell twice on Monday-states he thinks he hit head the second time he fell-states he feels "confused" and started stuttering today-pressured speech although resolves quickly-complaining of a headache

## 2020-09-01 ENCOUNTER — Other Ambulatory Visit: Payer: Self-pay

## 2020-09-01 ENCOUNTER — Emergency Department (HOSPITAL_COMMUNITY)
Admission: EM | Admit: 2020-09-01 | Discharge: 2020-09-01 | Disposition: A | Discharge status: Home / Self Care | Payer: Self-pay | Attending: Emergency Medicine | Admitting: Emergency Medicine

## 2020-09-01 ENCOUNTER — Encounter (HOSPITAL_COMMUNITY): Payer: Self-pay | Admitting: Emergency Medicine

## 2020-09-01 DIAGNOSIS — F17210 Nicotine dependence, cigarettes, uncomplicated: Secondary | ICD-10-CM | POA: Insufficient documentation

## 2020-09-01 DIAGNOSIS — R110 Nausea: Secondary | ICD-10-CM

## 2020-09-01 DIAGNOSIS — R739 Hyperglycemia, unspecified: Secondary | ICD-10-CM | POA: Insufficient documentation

## 2020-09-01 DIAGNOSIS — R112 Nausea with vomiting, unspecified: Secondary | ICD-10-CM | POA: Insufficient documentation

## 2020-09-01 DIAGNOSIS — R197 Diarrhea, unspecified: Secondary | ICD-10-CM | POA: Insufficient documentation

## 2020-09-01 LAB — COMPREHENSIVE METABOLIC PANEL
ALT: 43 U/L (ref 0–44)
AST: 32 U/L (ref 15–41)
Albumin: 5.3 g/dL — ABNORMAL HIGH (ref 3.5–5.0)
Alkaline Phosphatase: 62 U/L (ref 38–126)
Anion gap: 14 (ref 5–15)
BUN: 18 mg/dL (ref 6–20)
CO2: 20 mmol/L — ABNORMAL LOW (ref 22–32)
Calcium: 10.2 mg/dL (ref 8.9–10.3)
Chloride: 103 mmol/L (ref 98–111)
Creatinine, Ser: 0.94 mg/dL (ref 0.61–1.24)
GFR, Estimated: >60 mL/min (ref >60)
Glucose, Bld: 133 mg/dL — ABNORMAL HIGH (ref 70–99)
Potassium: 4 mmol/L (ref 3.5–5.1)
Sodium: 137 mmol/L (ref 135–145)
Total Bilirubin: 1.6 mg/dL — ABNORMAL HIGH (ref 0.3–1.2)
Total Protein: 8.7 g/dL — ABNORMAL HIGH (ref 6.5–8.1)

## 2020-09-01 LAB — CBC
HCT: 47.9 % (ref 39.0–52.0)
Hemoglobin: 17.1 g/dL — ABNORMAL HIGH (ref 13.0–17.0)
MCH: 32.6 pg (ref 26.0–34.0)
MCHC: 35.7 g/dL (ref 30.0–36.0)
MCV: 91.4 fL (ref 80.0–100.0)
Platelets: 444 10*3/uL — ABNORMAL HIGH (ref 150–400)
RBC: 5.24 MIL/uL (ref 4.22–5.81)
RDW: 12.9 % (ref 11.5–15.5)
WBC: 17.9 10*3/uL — ABNORMAL HIGH (ref 4.0–10.5)
nRBC: 0 % (ref 0.0–0.2)

## 2020-09-01 LAB — LIPASE, BLOOD: Lipase: 24 U/L (ref 11–51)

## 2020-09-01 LAB — CBG MONITORING, ED: Glucose-Capillary: 148 mg/dL — ABNORMAL HIGH (ref 70–99)

## 2020-09-01 MED ORDER — METOCLOPRAMIDE HCL 5 MG/ML IJ SOLN
10.0000 mg | Freq: Once | INTRAMUSCULAR | Status: AC
  Administered 2020-09-01: 10 mg via INTRAVENOUS
  Filled 2020-09-01: qty 2

## 2020-09-01 MED ORDER — ALUM & MAG HYDROXIDE-SIMETH 200-200-20 MG/5ML PO SUSP
30.0000 mL | Freq: Once | ORAL | Status: AC
  Administered 2020-09-01: 30 mL via ORAL
  Filled 2020-09-01: qty 30

## 2020-09-01 MED ORDER — KETOROLAC TROMETHAMINE 30 MG/ML IJ SOLN
30.0000 mg | Freq: Once | INTRAMUSCULAR | Status: AC
  Administered 2020-09-01: 30 mg via INTRAVENOUS
  Filled 2020-09-01: qty 1

## 2020-09-01 MED ORDER — DIPHENHYDRAMINE HCL 50 MG/ML IJ SOLN
12.5000 mg | Freq: Once | INTRAMUSCULAR | Status: AC
  Administered 2020-09-01: 12.5 mg via INTRAVENOUS
  Filled 2020-09-01: qty 1

## 2020-09-01 MED ORDER — SODIUM CHLORIDE 0.9 % IV BOLUS
1000.0000 mL | Freq: Once | INTRAVENOUS | Status: AC
  Administered 2020-09-01: 1000 mL via INTRAVENOUS

## 2020-09-01 NOTE — ED Notes (Signed)
Patient was drinking a bottle of water in the lobby then making his self vomit

## 2020-09-01 NOTE — ED Provider Notes (Signed)
[facility]-EMERGENCY DEPT Provider Note   CSN: [id number] Arrival date & time: 09/01/20  1510     History Chief Complaint  Patient presents with  . Nausea  . Emesis    Christopher Estes is a 35 y.o. male.  HPI   35 year old male with past medical history of HLD and migraines presents the emergency department with nausea/vomiting.  Patient states his symptoms started a couple days ago.  He is only been able to tolerate small amounts of water.  He states that the emesis is nonbloody.  He has had intermittent episodes of diarrhea.  Denies any specific abdominal pain.  Denies any fever/chills.  He has had symptoms like this before that has required IV medication.  No previous abdominal surgeries.  There was report from the triage note that the patient "passed out in the parking lot".  He states that he parked his car and was walking in, he felt too weak to get inside so he laid down.  He did not lose consciousness.  Patient called EMS from the parking lot.  Past Medical History:  Diagnosis Date  . Hyperlipidemia   . Insomnia   . Migraine     There are no problems to display for this patient.   Past Surgical History:  Procedure Laterality Date  . INNER EAR SURGERY    . TONSILLECTOMY         Family History  Problem Relation Age of Onset  . Diabetes Mother   . Hypertension Mother   . Asthma Mother   . Cancer Father   . Stroke Father   . Thyroid disease Father     Social History   Tobacco Use  . Smoking status: Current Every Day Smoker    Packs/day: 1.00    Types: Cigarettes  . Smokeless tobacco: Never Used  Vaping Use  . Vaping Use: Every day  . Substances: Nicotine, Flavoring  Substance Use Topics  . Alcohol use: Yes    Alcohol/week: 24.0 standard drinks    Types: 24 Cans of beer per week    Comment: weekly  . Drug use: Yes    Types: Marijuana    Home Medications Prior to Admission medications   Medication Sig Start Date End Date Taking?  Authorizing Provider  cyclobenzaprine (FLEXERIL) 5 MG tablet Take 1-2 tablets (5-10 mg total) by mouth 2 (two) times daily as needed for muscle spasms. 03/07/20   Wieters, Hallie C, PA-C  dicyclomine (BENTYL) 20 MG tablet Take 1 tablet (20 mg total) by mouth 2 (two) times daily. 04/06/20   Couture, Cortni S, PA-C  famotidine (PEPCID) 20 MG tablet Take 1 tablet (20 mg total) by mouth 2 (two) times daily. 04/06/20   Couture, Cortni S, PA-C  ondansetron (ZOFRAN) 4 MG tablet Take 1 tablet (4 mg total) by mouth every 6 (six) hours. 04/06/20   Couture, Cortni S, PA-C  pantoprazole (PROTONIX) 20 MG tablet Take 1 tablet (20 mg total) by mouth daily. 03/08/20   Petrucelli, Samantha R, PA-C  predniSONE (DELTASONE) 10 MG tablet Begin with 6 tabs on day 1, 5 tab on day 2, 4 tab on day 3, 3 tab on day 4, 2 tab on day 5, 1 tab on day 6-take with food 03/07/20   Wieters, [employer] C, PA-C  promethazine (PHENERGAN) 12.5 MG tablet Take 1 tablet (12.5 mg total) by mouth every 6 (six) hours as needed for nausea or vomiting. 03/08/20   Petrucelli, Samantha R, PA-C  sucralfate (CARAFATE) 1  GM/10ML suspension Take 10 mLs (1 g total) by mouth 4 (four) times daily -  with meals and at bedtime. 03/08/20   Petrucelli, Samantha R, PA-C  metoCLOPramide (REGLAN) 10 MG tablet Take 1 tablet (10 mg total) by mouth every 8 (eight) hours as needed for nausea or vomiting. Patient not taking: Reported on 11/20/2017 04/18/16 09/16/19  Azalia Bilis, MD    Allergies    Dilaudid [hydromorphone hcl]  Review of Systems   Review of Systems  Constitutional: Positive for appetite change and fatigue. Negative for chills and fever.  HENT: Negative for congestion.   Eyes: Negative for visual disturbance.  Respiratory: Negative for shortness of breath.   Cardiovascular: Negative for chest pain.  Gastrointestinal: Positive for diarrhea, nausea and vomiting. Negative for abdominal distention and abdominal pain.  Genitourinary: Negative for dysuria.  Skin:  Negative for rash.  Neurological: Negative for headaches.    Physical Exam Updated Vital Signs BP (!) 171/99 (BP Location: Right Arm)   Pulse 83   Temp 98.5 F (36.9 C) (Oral)   Resp 18   Ht 5\' 11"  (1.803 m)   Wt 91 kg   SpO2 100%   BMI 27.98 kg/m   Physical Exam Vitals and nursing note reviewed.  Constitutional:      Appearance: Normal appearance.  HENT:     Head: Normocephalic.     Mouth/Throat:     Mouth: Mucous membranes are moist.  Cardiovascular:     Rate and Rhythm: Normal rate.  Pulmonary:     Effort: Pulmonary effort is normal. No respiratory distress.  Abdominal:     General: There is no distension.     Palpations: Abdomen is soft.     Tenderness: There is no abdominal tenderness.  Skin:    General: Skin is warm.  Neurological:     Mental Status: He is alert and oriented to person, place, and time. Mental status is at baseline.  Psychiatric:        Mood and Affect: Mood normal.     ED Results / Procedures / Treatments   Labs (all labs ordered are listed, but only abnormal results are displayed) Labs Reviewed  COMPREHENSIVE METABOLIC PANEL - Abnormal; Notable for the following components:      Result Value   CO2 20 (*)    Glucose, Bld 133 (*)    Total Protein 8.7 (*)    Albumin 5.3 (*)    Total Bilirubin 1.6 (*)    All other components within normal limits  CBC - Abnormal; Notable for the following components:   WBC 17.9 (*)    Hemoglobin 17.1 (*)    Platelets 444 (*)    All other components within normal limits  CBG MONITORING, ED - Abnormal; Notable for the following components:   Glucose-Capillary 148 (*)    All other components within normal limits  LIPASE, BLOOD  URINALYSIS, ROUTINE W REFLEX MICROSCOPIC    EKG None  Radiology No results found.  Procedures Procedures   Medications Ordered in ED Medications  sodium chloride 0.9 % bolus 1,000 mL (has no administration in time range)  metoCLOPramide (REGLAN) injection 10 mg (has  no administration in time range)  diphenhydrAMINE (BENADRYL) injection 12.5 mg (has no administration in time range)  ketorolac (TORADOL) 30 MG/ML injection 30 mg (has no administration in time range)    ED Course  I have reviewed the triage vital signs and the nursing notes.  Pertinent labs & imaging results that were available during  my care of the patient were reviewed by me and considered in my medical decision making (see chart for details).    MDM Rules/Calculators/A&P                          35 year old male presents the emergency department with nausea/vomiting/diarrhea.  Vitals are normal on arrival.  Abdomen is soft and benign.  Of note patient was seen at [facility] health twice earlier today.  He originally left AMA and reportedly had to be escorted off the premise by security for becoming aggressive and verbally abusive towards staff. Patient states they were only willing to give him an oral dose of Zofran which does not work for him, he refused the medication and has now presented here for evaluation.  Patient drove himself to this emergency department.  However in the parking lot felt too weak to walk and so he laid down.  Denies any loss of consciousness.  EMS picked him up in the parking lot and brought him into the department.  No syncope or seizure.  Currently patient is calm, cooperative.  Blood work shows a mild leukocytosis, possibly reactive.  Blood work otherwise is reassuring, bilirubin is slightly elevated at 1.6, LFTs are normal.  No right upper quadrant tenderness, abdomen is benign.  He feels significantly improved after the IV medication, plan for GI cocktail and p.o. challenge.  Patient passed GI cocktail and p.o. challenge.  He feels well.  Is requesting to be discharged.  Patient will be discharged and treated as an outpatient.  Discharge plan and strict return to ED precautions discussed, patient verbalizes understanding and agreement.  Final Clinical  Impression(s) / ED Diagnoses Final diagnoses:  None    Rx / DC Orders ED Discharge Orders    None       Rozelle Logan, DO 09/01/20 2310

## 2020-09-01 NOTE — ED Triage Notes (Signed)
BIB EMS from [facility] parking lot. Patient reports N/V x3 days. Was trying to come into the ED but he 'passed out' and started shaking per bystanders. Potential seizure activity, no hx of seizures. Was seen 2x earlier today at [facility] ED.   CBG w/ EMS 116

## 2020-09-01 NOTE — Discharge Instructions (Addendum)
You have been seen and discharged from the emergency department.  Follow-up with your primary provider for reevaluation. Take home medications as prescribed. If you have any worsening symptoms or further concerns for health please return to an emergency department for further evaluation. 

## 2020-09-03 ENCOUNTER — Emergency Department (HOSPITAL_COMMUNITY): Payer: Self-pay

## 2020-09-03 ENCOUNTER — Other Ambulatory Visit: Payer: Self-pay

## 2020-09-03 ENCOUNTER — Encounter (HOSPITAL_COMMUNITY): Payer: Self-pay

## 2020-09-03 ENCOUNTER — Emergency Department (HOSPITAL_COMMUNITY)
Admission: EM | Admit: 2020-09-03 | Discharge: 2020-09-03 | Discharge status: Against Medical Advice | Payer: Self-pay | Attending: Emergency Medicine | Admitting: Emergency Medicine

## 2020-09-03 DIAGNOSIS — R079 Chest pain, unspecified: Secondary | ICD-10-CM

## 2020-09-03 DIAGNOSIS — R112 Nausea with vomiting, unspecified: Secondary | ICD-10-CM | POA: Insufficient documentation

## 2020-09-03 DIAGNOSIS — R0789 Other chest pain: Secondary | ICD-10-CM | POA: Insufficient documentation

## 2020-09-03 DIAGNOSIS — R12 Heartburn: Secondary | ICD-10-CM | POA: Insufficient documentation

## 2020-09-03 DIAGNOSIS — R109 Unspecified abdominal pain: Secondary | ICD-10-CM | POA: Insufficient documentation

## 2020-09-03 DIAGNOSIS — F17210 Nicotine dependence, cigarettes, uncomplicated: Secondary | ICD-10-CM | POA: Insufficient documentation

## 2020-09-03 LAB — CBC
HCT: 45.2 % (ref 39.0–52.0)
Hemoglobin: 15.9 g/dL (ref 13.0–17.0)
MCH: 32.6 pg (ref 26.0–34.0)
MCHC: 35.2 g/dL (ref 30.0–36.0)
MCV: 92.6 fL (ref 80.0–100.0)
Platelets: 363 10*3/uL (ref 150–400)
RBC: 4.88 MIL/uL (ref 4.22–5.81)
RDW: 13.2 % (ref 11.5–15.5)
WBC: 11.9 10*3/uL — ABNORMAL HIGH (ref 4.0–10.5)
nRBC: 0 % (ref 0.0–0.2)

## 2020-09-03 LAB — COMPREHENSIVE METABOLIC PANEL
ALT: 41 U/L (ref 0–44)
AST: 31 U/L (ref 15–41)
Albumin: 4.4 g/dL (ref 3.5–5.0)
Alkaline Phosphatase: 48 U/L (ref 38–126)
Anion gap: 12 (ref 5–15)
BUN: 15 mg/dL (ref 6–20)
CO2: 21 mmol/L — ABNORMAL LOW (ref 22–32)
Calcium: 9.3 mg/dL (ref 8.9–10.3)
Chloride: 105 mmol/L (ref 98–111)
Creatinine, Ser: 0.89 mg/dL (ref 0.61–1.24)
GFR, Estimated: >60 mL/min (ref >60)
Glucose, Bld: 104 mg/dL — ABNORMAL HIGH (ref 70–99)
Potassium: 3.5 mmol/L (ref 3.5–5.1)
Sodium: 138 mmol/L (ref 135–145)
Total Bilirubin: 1.1 mg/dL (ref 0.3–1.2)
Total Protein: 7.5 g/dL (ref 6.5–8.1)

## 2020-09-03 LAB — TROPONIN I (HIGH SENSITIVITY): Troponin I (High Sensitivity): 2 ng/L (ref <18)

## 2020-09-03 LAB — LIPASE, BLOOD: Lipase: 29 U/L (ref 11–51)

## 2020-09-03 MED ORDER — PROMETHAZINE HCL 25 MG/ML IJ SOLN
25.0000 mg | Freq: Once | INTRAMUSCULAR | Status: AC
  Administered 2020-09-03: 25 mg via INTRAVENOUS
  Filled 2020-09-03: qty 1

## 2020-09-03 MED ORDER — ALUM & MAG HYDROXIDE-SIMETH 200-200-20 MG/5ML PO SUSP
30.0000 mL | Freq: Once | ORAL | Status: AC
  Administered 2020-09-03: 30 mL via ORAL
  Filled 2020-09-03: qty 30

## 2020-09-03 MED ORDER — SODIUM CHLORIDE 0.9 % IV BOLUS
1000.0000 mL | Freq: Once | INTRAVENOUS | Status: AC
  Administered 2020-09-03: 1000 mL via INTRAVENOUS

## 2020-09-03 MED ORDER — LIDOCAINE VISCOUS HCL 2 % MT SOLN
15.0000 mL | Freq: Once | OROMUCOSAL | Status: DC
  Filled 2020-09-03: qty 15

## 2020-09-03 MED ORDER — SUCRALFATE 1 G PO TABS
1.0000 g | ORAL_TABLET | Freq: Once | ORAL | Status: DC
  Filled 2020-09-03: qty 1

## 2020-09-03 MED ORDER — METOCLOPRAMIDE HCL 5 MG/ML IJ SOLN
10.0000 mg | Freq: Once | INTRAMUSCULAR | Status: AC
  Administered 2020-09-03: 10 mg via INTRAVENOUS
  Filled 2020-09-03: qty 2

## 2020-09-03 NOTE — ED Triage Notes (Signed)
Pt presents with c/o N/V and chest pain. Pt reports he was seen 2 days ago for nausea and vomiting and reports he woke up this morning with the chest pain.

## 2020-09-03 NOTE — ED Provider Notes (Signed)
[facility]-EMERGENCY DEPT Provider Note   CSN: [id number] Arrival date & time: 09/03/20  6578     History Chief Complaint  Patient presents with  . Chest Pain  . Vomiting    Christopher Estes is a 35 y.o. male.  He is here with a complaint of nausea and vomiting, burning in the chest.  He was here 2 days ago for same.  Yesterday did fairly well.  He gets these symptoms often.  He says he has not smoked weed or drank alcohol in a few weeks.  Is unable to follow-up with GI due to lack of insurance.  Has Zofran at home does not help.  Vomited some blood.  History of same. The history is provided by the patient.  Chest Pain Pain location:  Substernal area Pain quality: burning   Pain radiates to:  Does not radiate Pain severity:  Moderate Onset quality:  Gradual Timing:  Intermittent Progression:  Unchanged Chronicity:  Recurrent Context comment:  Vomiting Relieved by:  None tried Exacerbated by: vomiting. Ineffective treatments:  None tried Associated symptoms: abdominal pain, heartburn, nausea and vomiting   Associated symptoms: no cough, no fever and no headache   Vomiting:    Quality:  Stomach contents and bright red blood   Number of occurrences:  1 with blood, multiple    Severity:  Moderate   Timing:  Intermittent   Progression:  Unchanged Risk factors: male sex and smoking   Risk factors: no coronary artery disease and no diabetes mellitus        Past Medical History:  Diagnosis Date  . Hyperlipidemia   . Insomnia   . Migraine     There are no problems to display for this patient.   Past Surgical History:  Procedure Laterality Date  . INNER EAR SURGERY    . TONSILLECTOMY         Family History  Problem Relation Age of Onset  . Diabetes Mother   . Hypertension Mother   . Asthma Mother   . Cancer Father   . Stroke Father   . Thyroid disease Father     Social History   Tobacco Use  . Smoking status: Current Every Day Smoker     Packs/day: 1.00    Types: Cigarettes  . Smokeless tobacco: Never Used  Vaping Use  . Vaping Use: Every day  . Substances: Nicotine, Flavoring  Substance Use Topics  . Alcohol use: Yes    Alcohol/week: 24.0 standard drinks    Types: 24 Cans of beer per week    Comment: weekly  . Drug use: Yes    Types: Marijuana    Home Medications Prior to Admission medications   Medication Sig Start Date End Date Taking? Authorizing Provider  cyclobenzaprine (FLEXERIL) 5 MG tablet Take 1-2 tablets (5-10 mg total) by mouth 2 (two) times daily as needed for muscle spasms. Patient not taking: Reported on 09/01/2020 03/07/20   Wieters, Fran [employer] C, PA-C  dicyclomine (BENTYL) 20 MG tablet Take 1 tablet (20 mg total) by mouth 2 (two) times daily. Patient not taking: No sig reported 04/06/20   Couture, Cortni S, PA-C  famotidine (PEPCID) 20 MG tablet Take 1 tablet (20 mg total) by mouth 2 (two) times daily. Patient not taking: No sig reported 04/06/20   Couture, Cortni S, PA-C  ondansetron (ZOFRAN) 4 MG tablet Take 1 tablet (4 mg total) by mouth every 6 (six) hours. Patient not taking: No sig reported 04/06/20   Samson Frederic,  Cortni S, PA-C  pantoprazole (PROTONIX) 20 MG tablet Take 1 tablet (20 mg total) by mouth daily. Patient not taking: Reported on 09/01/2020 03/08/20   Petrucelli, Pleas Koch, PA-C  predniSONE (DELTASONE) 10 MG tablet Begin with 6 tabs on day 1, 5 tab on day 2, 4 tab on day 3, 3 tab on day 4, 2 tab on day 5, 1 tab on day 6-take with food Patient not taking: Reported on 09/01/2020 03/07/20   Wieters, Hallie C, PA-C  promethazine (PHENERGAN) 12.5 MG tablet Take 1 tablet (12.5 mg total) by mouth every 6 (six) hours as needed for nausea or vomiting. Patient not taking: Reported on 09/01/2020 03/08/20   Petrucelli, Lelon Mast R, PA-C  sucralfate (CARAFATE) 1 GM/10ML suspension Take 10 mLs (1 g total) by mouth 4 (four) times daily -  with meals and at bedtime. Patient not taking: Reported on 09/01/2020 03/08/20   Petrucelli,  Pleas Koch, PA-C  metoCLOPramide (REGLAN) 10 MG tablet Take 1 tablet (10 mg total) by mouth every 8 (eight) hours as needed for nausea or vomiting. Patient not taking: Reported on 11/20/2017 04/18/16 09/16/19  Azalia Bilis, MD    Allergies    Dilaudid [hydromorphone hcl]  Review of Systems   Review of Systems  Constitutional: Negative for fever.  HENT: Negative for sore throat.   Eyes: Negative for visual disturbance.  Respiratory: Negative for cough.   Cardiovascular: Positive for chest pain.  Gastrointestinal: Positive for abdominal pain, heartburn, nausea and vomiting. Negative for blood in stool.  Genitourinary: Negative for dysuria.  Musculoskeletal: Negative for gait problem.  Skin: Negative for rash.  Neurological: Negative for headaches.    Physical Exam Updated Vital Signs BP (!) 167/109 (BP Location: Left Arm)   Pulse 84   Temp 98.4 F (36.9 C) (Oral)   Resp (!) 22   SpO2 99%   Physical Exam Vitals and nursing note reviewed.  Constitutional:      Appearance: He is well-developed and well-nourished.  HENT:     Head: Normocephalic and atraumatic.  Eyes:     Conjunctiva/sclera: Conjunctivae normal.  Cardiovascular:     Rate and Rhythm: Normal rate and regular rhythm.     Heart sounds: No murmur heard.   Pulmonary:     Effort: Pulmonary effort is normal. No respiratory distress.     Breath sounds: Normal breath sounds.  Abdominal:     Palpations: Abdomen is soft. There is no mass.     Tenderness: There is no abdominal tenderness. There is no guarding or rebound.  Musculoskeletal:        General: No edema. Normal range of motion.     Cervical back: Neck supple.     Right lower leg: No tenderness.     Left lower leg: No tenderness.  Skin:    General: Skin is warm and dry.     Capillary Refill: Capillary refill takes less than 2 seconds.  Neurological:     General: No focal deficit present.     Mental Status: He is alert.  Psychiatric:        Mood and  Affect: Mood and affect normal.     ED Results / Procedures / Treatments   Labs (all labs ordered are listed, but only abnormal results are displayed) Labs Reviewed  CBC - Abnormal; Notable for the following components:      Result Value   WBC 11.9 (*)    All other components within normal limits  COMPREHENSIVE METABOLIC PANEL - Abnormal;  Notable for the following components:   CO2 21 (*)    Glucose, Bld 104 (*)    All other components within normal limits  LIPASE, BLOOD  TROPONIN I (HIGH SENSITIVITY)    EKG EKG Interpretation  Date/Time:  Friday September 03 2020 09:11:10 EST Ventricular Rate:  83 PR Interval:    QRS Duration: 98 QT Interval:  372 QTC Calculation: 438 R Axis:   87 Text Interpretation: Sinus rhythm 12 Lead; Mason-Likar No significant change since prior 2/22 Confirmed by Meridee Score [phone]) on 09/03/2020 9:16:34 AM   Radiology No results found.  Procedures Procedures   Medications Ordered in ED Medications  sodium chloride 0.9 % bolus 1,000 mL (has no administration in time range)  metoCLOPramide (REGLAN) injection 10 mg (has no administration in time range)  sucralfate (CARAFATE) tablet 1 g (has no administration in time range)    ED Course  I have reviewed the triage vital signs and the nursing notes.  Pertinent labs & imaging results that were available during my care of the patient were reviewed by me and considered in my medical decision making (see chart for details).  Clinical Course as of 09/04/20 1111  Fri Sep 03, 2020  2595 Chest x-ray interpreted by me as no acute infiltrates. [MB]    Clinical Course User Index [MB] Terrilee Files, MD   MDM Rules/Calculators/A&P                         This patient complains of burning chest pain nausea vomiting; this involves an extensive number of treatment Options and is a complaint that carries with it a high risk of complications and Morbidity. The differential includes GERD,  hyperemesis, gastroparesis, gastritis, ACS  I ordered, reviewed and interpreted labs, which included mildly elevated white count stable hemoglobin, normal chemistries other than slightly low bicarb, normal LFTs and lipase, troponin flat I ordered medication IV fluids Reglan Phenergan Maalox I ordered imaging studies which included chest x-ray and I independently    visualized and interpreted imaging which showed no acute findings Previous records obtained and reviewed in epic, multiple ED visits for similar presentations  After the interventions stated above, I reevaluated the patient and found patient symptoms to be improved.   Final Clinical Impression(s) / ED Diagnoses Final diagnoses:  Non-intractable vomiting with nausea, unspecified vomiting type  Atypical chest pain    Rx / DC Orders ED Discharge Orders    None       Terrilee Files, MD 09/04/20 1114

## 2020-10-31 ENCOUNTER — Emergency Department (HOSPITAL_COMMUNITY): Payer: Self-pay

## 2020-10-31 ENCOUNTER — Emergency Department (HOSPITAL_COMMUNITY)
Admission: EM | Admit: 2020-10-31 | Discharge: 2020-10-31 | Disposition: A | Discharge status: Home / Self Care | Payer: Self-pay | Attending: Emergency Medicine | Admitting: Emergency Medicine

## 2020-10-31 ENCOUNTER — Encounter (HOSPITAL_COMMUNITY): Payer: Self-pay | Admitting: Emergency Medicine

## 2020-10-31 ENCOUNTER — Other Ambulatory Visit: Payer: Self-pay

## 2020-10-31 DIAGNOSIS — F17210 Nicotine dependence, cigarettes, uncomplicated: Secondary | ICD-10-CM | POA: Insufficient documentation

## 2020-10-31 DIAGNOSIS — R1013 Epigastric pain: Secondary | ICD-10-CM | POA: Insufficient documentation

## 2020-10-31 DIAGNOSIS — R112 Nausea with vomiting, unspecified: Secondary | ICD-10-CM

## 2020-10-31 DIAGNOSIS — K920 Hematemesis: Secondary | ICD-10-CM | POA: Insufficient documentation

## 2020-10-31 LAB — COMPREHENSIVE METABOLIC PANEL
ALT: 25 U/L (ref 0–44)
AST: 20 U/L (ref 15–41)
Albumin: 5.1 g/dL — ABNORMAL HIGH (ref 3.5–5.0)
Alkaline Phosphatase: 61 U/L (ref 38–126)
Anion gap: 16 — ABNORMAL HIGH (ref 5–15)
BUN: 17 mg/dL (ref 6–20)
CO2: 21 mmol/L — ABNORMAL LOW (ref 22–32)
Calcium: 10.1 mg/dL (ref 8.9–10.3)
Chloride: 97 mmol/L — ABNORMAL LOW (ref 98–111)
Creatinine, Ser: 0.88 mg/dL (ref 0.61–1.24)
GFR, Estimated: >60 mL/min (ref >60)
Glucose, Bld: 130 mg/dL — ABNORMAL HIGH (ref 70–99)
Potassium: 3.3 mmol/L — ABNORMAL LOW (ref 3.5–5.1)
Sodium: 134 mmol/L — ABNORMAL LOW (ref 135–145)
Total Bilirubin: 3.1 mg/dL — ABNORMAL HIGH (ref 0.3–1.2)
Total Protein: 8.6 g/dL — ABNORMAL HIGH (ref 6.5–8.1)

## 2020-10-31 LAB — CBC
HCT: 45.3 % (ref 39.0–52.0)
Hemoglobin: 16.4 g/dL (ref 13.0–17.0)
MCH: 32.7 pg (ref 26.0–34.0)
MCHC: 36.2 g/dL — ABNORMAL HIGH (ref 30.0–36.0)
MCV: 90.2 fL (ref 80.0–100.0)
Platelets: 442 10*3/uL — ABNORMAL HIGH (ref 150–400)
RBC: 5.02 MIL/uL (ref 4.22–5.81)
RDW: 13.2 % (ref 11.5–15.5)
WBC: 16.3 10*3/uL — ABNORMAL HIGH (ref 4.0–10.5)
nRBC: 0 % (ref 0.0–0.2)

## 2020-10-31 LAB — LIPASE, BLOOD: Lipase: 20 U/L (ref 11–51)

## 2020-10-31 MED ORDER — FENTANYL CITRATE (PF) 100 MCG/2ML IJ SOLN
100.0000 ug | Freq: Once | INTRAMUSCULAR | Status: AC
Start: 2020-10-31 — End: 2020-10-31
  Administered 2020-10-31: 100 ug via INTRAVENOUS
  Filled 2020-10-31: qty 2

## 2020-10-31 MED ORDER — ALUM & MAG HYDROXIDE-SIMETH 200-200-20 MG/5ML PO SUSP
30.0000 mL | Freq: Once | ORAL | Status: AC
  Administered 2020-10-31: 30 mL via ORAL
  Filled 2020-10-31: qty 30

## 2020-10-31 MED ORDER — SODIUM CHLORIDE 0.9 % IV BOLUS
1000.0000 mL | Freq: Once | INTRAVENOUS | Status: AC
  Administered 2020-10-31: 1000 mL via INTRAVENOUS

## 2020-10-31 MED ORDER — FAMOTIDINE 20 MG PO TABS: 20.0000 mg | ORAL_TABLET | Freq: Two times a day (BID) | ORAL | 1 refills | Status: AC

## 2020-10-31 MED ORDER — ONDANSETRON HCL 4 MG/2ML IJ SOLN
4.0000 mg | Freq: Once | INTRAMUSCULAR | Status: AC
  Administered 2020-10-31: 4 mg via INTRAVENOUS
  Filled 2020-10-31: qty 2

## 2020-10-31 MED ORDER — FAMOTIDINE IN NACL 20-0.9 MG/50ML-% IV SOLN
20.0000 mg | Freq: Once | INTRAVENOUS | Status: AC
  Administered 2020-10-31: 20 mg via INTRAVENOUS
  Filled 2020-10-31: qty 50

## 2020-10-31 MED ORDER — SODIUM CHLORIDE 0.9 % IV SOLN
80.0000 mg | Freq: Once | INTRAVENOUS | Status: AC
  Administered 2020-10-31: 80 mg via INTRAVENOUS
  Filled 2020-10-31: qty 80

## 2020-10-31 MED ORDER — MORPHINE SULFATE (PF) 4 MG/ML IV SOLN
4.0000 mg | Freq: Once | INTRAVENOUS | Status: AC
  Administered 2020-10-31: 4 mg via INTRAVENOUS
  Filled 2020-10-31: qty 1

## 2020-10-31 MED ORDER — ONDANSETRON 4 MG PO TBDP: 4.0000 mg | ORAL_TABLET | Freq: Three times a day (TID) | ORAL | 0 refills | Status: DC | PRN

## 2020-10-31 MED ORDER — PANTOPRAZOLE SODIUM 20 MG PO TBEC: 20.0000 mg | DELAYED_RELEASE_TABLET | Freq: Every day | ORAL | 1 refills | Status: AC

## 2020-10-31 MED ORDER — LIDOCAINE VISCOUS HCL 2 % MT SOLN
15.0000 mL | Freq: Once | OROMUCOSAL | Status: AC
  Administered 2020-10-31: 15 mL via ORAL
  Filled 2020-10-31: qty 15

## 2020-10-31 NOTE — ED Notes (Signed)
Pt given gingerale and crackers 

## 2020-10-31 NOTE — ED Triage Notes (Signed)
Patient states he has been 'sick' since Thursday, thinks he has a dental infection, had been given amoxicillin for a dental infection from a tele-doctor, reports coffee-ground emesis yesterday and then bright red blood this morning. Feels like someone is stabbing him in his stomach.

## 2020-10-31 NOTE — ED Provider Notes (Signed)
[facility]-EMERGENCY DEPT Provider Note   CSN: [id number] Arrival date & time: 10/31/20  0725     History Chief Complaint  Patient presents with  . Dental Pain  . Emesis    Christopher Estes is a 35 y.o. male presented to emergency department with hematemesis.  The patient reports he has been on amoxicillin for dental infection for the past week.  He said he is a several days of persistent nausea and vomiting, he says anytime he eats or drinks anything he feels he needs to vomit.  For the past 2 days he has seen blood-tinged in his vomit.  He is having very sharp epigastric pain.  He says he does suffer from reflux, and in the past Tums and GI cocktails have helped him.  He denies any history of abdominal surgery.  He is not on blood thinners.  He does smoke marijuana but denies use of any other drugs.  No recent alcohol binges.  He is allergic to Dilaudid, but not the morphine.  He reports he has had several episodes like this in the past, and gets an episode about once a month.  He says it never is a severe and usually resolves.  He does smoke marijuana daily.  HPI     Past Medical History:  Diagnosis Date  . Hyperlipidemia   . Insomnia   . Migraine     There are no problems to display for this patient.   Past Surgical History:  Procedure Laterality Date  . INNER EAR SURGERY    . TONSILLECTOMY         Family History  Problem Relation Age of Onset  . Diabetes Mother   . Hypertension Mother   . Asthma Mother   . Cancer Father   . Stroke Father   . Thyroid disease Father     Social History   Tobacco Use  . Smoking status: Current Every Day Smoker    Packs/day: 1.00    Types: Cigarettes  . Smokeless tobacco: Never Used  Vaping Use  . Vaping Use: Every day  . Substances: Nicotine, Flavoring  Substance Use Topics  . Alcohol use: Yes    Alcohol/week: 24.0 standard drinks    Types: 24 Cans of beer per week    Comment: weekly  . Drug use: Yes     Types: Marijuana    Home Medications Prior to Admission medications   Medication Sig Start Date End Date Taking? Authorizing Provider  famotidine (PEPCID) 20 MG tablet Take 1 tablet (20 mg total) by mouth 2 (two) times daily. 10/31/20 11/30/20 Yes Kase Shughart, Kermit Balo, MD  ondansetron (ZOFRAN ODT) 4 MG disintegrating tablet Take 1 tablet (4 mg total) by mouth every 8 (eight) hours as needed for up to 15 doses for nausea or vomiting. 10/31/20  Yes Terald Sleeper, MD  pantoprazole (PROTONIX) 20 MG tablet Take 1 tablet (20 mg total) by mouth daily. 10/31/20 11/30/20 Yes Irvin Bastin, Kermit Balo, MD  dicyclomine (BENTYL) 20 MG tablet Take 1 tablet (20 mg total) by mouth 2 (two) times daily. Patient not taking: No sig reported 04/06/20   Couture, Cortni S, PA-C  famotidine (PEPCID) 20 MG tablet Take 1 tablet (20 mg total) by mouth 2 (two) times daily. Patient not taking: No sig reported 04/06/20   Couture, Cortni S, PA-C  ondansetron (ZOFRAN) 4 MG tablet Take 1 tablet (4 mg total) by mouth every 6 (six) hours. Patient not taking: No sig reported 04/06/20  Couture, Cortni S, PA-C  metoCLOPramide (REGLAN) 10 MG tablet Take 1 tablet (10 mg total) by mouth every 8 (eight) hours as needed for nausea or vomiting. Patient not taking: Reported on 11/20/2017 04/18/16 09/16/19  Azalia Bilisampos, Kevin, MD    Allergies    Dilaudid [hydromorphone hcl]  Review of Systems   Review of Systems  Constitutional: Positive for fever. Negative for chills.  HENT: Negative for ear pain and sore throat.   Eyes: Negative for pain and visual disturbance.  Respiratory: Negative for cough and shortness of breath.   Cardiovascular: Negative for chest pain and palpitations.  Gastrointestinal: Positive for abdominal pain, nausea and vomiting.  Genitourinary: Negative for dysuria and hematuria.  Musculoskeletal: Negative for arthralgias and back pain.  Skin: Negative for color change and rash.  Neurological: Negative for syncope and light-headedness.   All other systems reviewed and are negative.   Physical Exam Updated Vital Signs BP 128/75 (BP Location: Right Arm)   Pulse 72   Temp 99.1 F (37.3 C) (Oral)   Resp 18   Ht 5\' 11"  (1.803 m)   Wt 91 kg   SpO2 98%   BMI 27.98 kg/m   Physical Exam Constitutional:      General: He is not in acute distress. HENT:     Head: Normocephalic and atraumatic.     Comments: Poor dentitia Eyes:     Conjunctiva/sclera: Conjunctivae normal.     Pupils: Pupils are equal, round, and reactive to light.  Cardiovascular:     Rate and Rhythm: Normal rate and regular rhythm.  Pulmonary:     Effort: Pulmonary effort is normal. No respiratory distress.  Abdominal:     General: There is no distension.     Tenderness: There is abdominal tenderness in the right upper quadrant and epigastric area. There is guarding. Positive signs include Murphy's sign.  Skin:    General: Skin is warm and dry.  Neurological:     General: No focal deficit present.     Mental Status: He is alert. Mental status is at baseline.  Psychiatric:        Mood and Affect: Mood normal.        Behavior: Behavior normal.     ED Results / Procedures / Treatments   Labs (all labs ordered are listed, but only abnormal results are displayed) Labs Reviewed  COMPREHENSIVE METABOLIC PANEL - Abnormal; Notable for the following components:      Result Value   Sodium 134 (*)    Potassium 3.3 (*)    Chloride 97 (*)    CO2 21 (*)    Glucose, Bld 130 (*)    Total Protein 8.6 (*)    Albumin 5.1 (*)    Total Bilirubin 3.1 (*)    Anion gap 16 (*)    All other components within normal limits  CBC - Abnormal; Notable for the following components:   WBC 16.3 (*)    MCHC 36.2 (*)    Platelets 442 (*)    All other components within normal limits  LIPASE, BLOOD    EKG None  Radiology DG Chest Portable 1 View  Result Date: 10/31/2020 CLINICAL DATA:  Hematemesis.  Evaluate for aspiration. EXAM: PORTABLE CHEST 1 VIEW  COMPARISON:  09/03/2020; 05/01/2020 FINDINGS: Grossly unchanged cardiac silhouette and mediastinal contours. No focal parenchymal opacities. No pleural effusion or pneumothorax. No evidence of edema. No acute osseous abnormalities. IMPRESSION: No acute cardiopulmonary disease. Electronically Signed   By: Holland CommonsJohn  Watts M.D.  On: 10/31/2020 08:48   US Abdomen Limited RUQ (LIVER/GB)  Result Date: 10/31/2020 CLINICAL DATA:  Right upper quadrant abdominal pain, nausea and vomiting for 3 days. EXAM: ULTRASOUND ABDOMEN LIMITED RIGHT UPPER QUADRANT COMPARISON:  04/06/2020 CT abdomen/pelvis. FINDINGS: Gallbladder: No gallstones or wall thickening visualized. No sonographic Murphy sign noted by sonographer. Common bile duct: Diameter: 4 mm Liver: No focal lesion identified. Within normal limits in parenchymal echogenicity. Portal vein is patent on color Doppler imaging with normal direction of blood flow towards the liver. Other: None. IMPRESSION: Normal right upper quadrant abdominal sonogram.  No cholelithiasis. Electronically Signed   By: Delbert Phenix M.D.   On: 10/31/2020 10:12    Procedures Procedures   Medications Ordered in ED Medications  famotidine (PEPCID) IVPB 20 mg premix (0 mg Intravenous Stopped 10/31/20 0846)  pantoprazole (PROTONIX) 80 mg in sodium chloride 0.9 % 100 mL IVPB (0 mg Intravenous Stopped 10/31/20 0931)  alum & mag hydroxide-simeth (MAALOX/MYLANTA) 200-200-20 MG/5ML suspension 30 mL (30 mLs Oral Given 10/31/20 0818)    And  lidocaine (XYLOCAINE) 2 % viscous mouth solution 15 mL (15 mLs Oral Given 10/31/20 0813)  morphine 4 MG/ML injection 4 mg (4 mg Intravenous Given 10/31/20 0814)  ondansetron (ZOFRAN) injection 4 mg (4 mg Intravenous Given 10/31/20 0814)  sodium chloride 0.9 % bolus 1,000 mL (0 mLs Intravenous Stopped 10/31/20 1054)  fentaNYL (SUBLIMAZE) injection 100 mcg (100 mcg Intravenous Given 10/31/20 4650)    ED Course  I have reviewed the triage vital signs and the nursing  notes.  Pertinent labs & imaging results that were available during my care of the patient were reviewed by me and considered in my medical decision making (see chart for details).  This patient complains of abdominal pain, vomiting, hematemesis.  This involves an extensive number of treatment options, and is a complaint that carries with it a high risk of complications and morbidity.  The differential diagnosis includes cannabis hyperemesis syndrome vs gastroparesis vs biliary disease vs gastritis vs peptic ulcer vs other  He has had similar episodes frequently in the past about once per month, which leads me to believe this is more likely a gastric issue.  Hematemesis is likely from recurrent vomiting all week; possible peptic ulcer or mallory weiss tear.   I ordered, reviewed, and interpreted labs. Lipase 20.  WBC 16.3, Hgb 16.4 (likely some element of hemoconcentration),  Cr 0.88.  Anion gap 16 - likely mild dehydration.  Tbili 3.1, AST and ALT normal.  K 3.3. I ordered medication IV fluids, IV nausea and pain medications for dehydration and symptoms I ordered imaging studies which included DG chest, RUQ ultrasound I independently visualized and interpreted imaging which showed no life-threatening abnormalities or evidence of chest infection, and the monitor tracing which showed NSR   Clinical Course as of 10/31/20 1658  Sun Oct 31, 2020  0914 I called the lab to see if they can add on the lipase, which they stated they will do.  On my reassessment the patient reports his nausea significantly improved, but continues having significant 8 out of 10 epigastric pain.  Differential includes pancreatitis versus biliary disease.  On my reassessment he does now seem to have more focal right upper quadrant tenderness with a positive Murphy sign.  We will get an ultrasound of his gallbladder. [MT]  1019 IMPRESSION: Normal right upper quadrant abdominal sonogram. No cholelithiasis. [MT]  1019 Prior  review of CBC shows patient typically has some level leukocytosis.  I do  suspect this WBC of 16.3 is a reactive leukocytosis with hemoconcentration. [MT]  1146 Patient is feeling better now and is able to tolerate an entire ginger ale, small amount of food.  He continues to have some indigestion.  I will start him on Pepcid and Protonix, prescribe Zofran as well.  Advised a liquid diet for 2 days, and will place referral to GI given the extent of his symptoms.  He verbalized understanding.  I also strongly advised stopping marijuana smoking, as this may be related to cannabis hyperemesis. [MT]  1146 His repeat abdominal exam is completely benign.  There is no focal tenderness in the right lower quadrant suggest appendicitis.  No bowel distention.  I have a low suspicion for bowel obstruction. [MT]    Clinical Course User Index [MT] Steffie Waggoner, Kermit Balo, MD    Final Clinical Impression(s) / ED Diagnoses Final diagnoses:  Nausea & vomiting    Rx / DC Orders ED Discharge Orders         Ordered    famotidine (PEPCID) 20 MG tablet  2 times daily        10/31/20 1150    pantoprazole (PROTONIX) 20 MG tablet  Daily        10/31/20 1150    ondansetron (ZOFRAN ODT) 4 MG disintegrating tablet  Every 8 hours PRN        10/31/20 1150    Ambulatory referral to Gastroenterology        10/31/20 1150           Wendie Diskin, Kermit Balo, MD 10/31/20 1658

## 2020-10-31 NOTE — Discharge Instructions (Addendum)
For the next 2 days, stick to a liquid diet.  This can include smoothies and soup broth, but should avoid any solid food.  Please drink plenty of water to keep hydrated.  You can sip water throughout the day.  If you are still feeling nauseous, you can take the Zofran I prescribed for nausea.  I also prescribed 2 medications for acid reflux, which are Pepcid and Protonix.  You will need to follow-up with a GI specialist for this issue.  I placed the referral.  They should call you within 2 business days.  If they do not, please call the number listed above.  Try to avoid very spicy or heavy foods.  I gave you a list of foods to eat and try to avoid with your stomach upset.  You should avoid alcohol and marijuana.  Both of these can be triggers for vomiting and stomach irritation.  If your pain gets a lot worse, or you are continuing to have vomiting and cannot keep down water, you should return to the ER.

## 2020-11-15 ENCOUNTER — Ambulatory Visit (HOSPITAL_COMMUNITY)
Admission: RE | Admit: 2020-11-15 | Discharge: 2020-11-15 | Disposition: A | Discharge status: Home / Self Care | Payer: Self-pay | Source: Ambulatory Visit

## 2020-11-15 ENCOUNTER — Encounter (HOSPITAL_COMMUNITY): Payer: Self-pay

## 2020-11-15 ENCOUNTER — Other Ambulatory Visit: Payer: Self-pay

## 2020-11-15 ENCOUNTER — Ambulatory Visit (INDEPENDENT_AMBULATORY_CARE_PROVIDER_SITE_OTHER): Payer: Self-pay

## 2020-11-15 VITALS — BP 126/77 | HR 69 | Temp 98.4°F | Resp 18

## 2020-11-15 DIAGNOSIS — S4991XA Unspecified injury of right shoulder and upper arm, initial encounter: Secondary | ICD-10-CM

## 2020-11-15 DIAGNOSIS — M25511 Pain in right shoulder: Secondary | ICD-10-CM

## 2020-11-15 MED ORDER — IBUPROFEN 800 MG PO TABS: 800.0000 mg | ORAL_TABLET | Freq: Three times a day (TID) | ORAL | 0 refills | Status: DC

## 2020-11-15 NOTE — Discharge Instructions (Addendum)
Your x-ray was normal.  I am concerned about a muscle injury.  We will try ibuprofen 800 mg 3 times daily.  He should not take additional NSAIDs (aspirin, ibuprofen/Advil, naproxen/Aleve with this medication can lead to stomach bleeding.  Please try to rest your arm.  Follow-up with Ortho as we discussed.  If you have any worsening symptoms you need to be reevaluated.

## 2020-11-15 NOTE — ED Triage Notes (Addendum)
Pt hurt right shoulder playing hockey 3 weeks ago, was not seen. Yesterday pt was playing hockey and put right arm out for balance. States felt that shoulder came out of joint and when it popped back in felt a ripping sensation on back of arm. Some swelling noted.   States that at times he loses grip strength to right hand. Grip noted to be slightly weaker on right at this time. States that when he does something that hurts his arm, he feels sensation of hand falling asleep stating it "deadens" arm.

## 2020-11-15 NOTE — ED Provider Notes (Signed)
[facility]    CSN: [id number] Arrival date & time: 11/15/20  1144      History   Chief Complaint Chief Complaint  Patient presents with  . Appointment    HPI Christopher Estes is a 35 y.o. male.   Patient presents today with a 1 day history of worsening right shoulder/arm pain.  Reports he initially injured his right arm while playing hockey 3 weeks ago but after 4 days this resolved without intervention.  Yesterday, while playing hockey, he threw his hands up trying to block something and felt his right shoulder go in and out of socket with a sudden pain following this.  At rest, pain is rated 4 on a 0-10 pain scale but increases to 10 with certain movements, localized to posterior right shoulder with radiation into posterior right arm, described as aching, worse with movements, described as shooting/burning, no alleviating factors identified.  He has tried Tylenol and ibuprofen without improvement of symptoms.  He reports intermittent numbness and weakness; states that when he tries to use his arm he will occasionally have numbness and weakness in his hand that last for few minutes then resolves.  He denies previous injury or surgery.  He is ambidextrous and does most things with his left hand but does write with his right hand.     Past Medical History:  Diagnosis Date  . Hyperlipidemia   . Insomnia   . Migraine     There are no problems to display for this patient.   Past Surgical History:  Procedure Laterality Date  . INNER EAR SURGERY    . TONSILLECTOMY         Home Medications    Prior to Admission medications   Medication Sig Start Date End Date Taking? Authorizing Provider  amoxicillin (AMOXIL) 250 MG capsule Take 250 mg by mouth 2 (two) times daily. Pt unsure of dosage but thinks he takes 325mg  bid. For dental infection   Yes [provider]  famotidine (PEPCID) 20 MG tablet Take 1 tablet (20 mg total) by mouth 2 (two) times daily. 10/31/20  11/30/20 Yes Trifan, 01/30/21, MD  ibuprofen (ADVIL) 800 MG tablet Take 1 tablet (800 mg total) by mouth 3 (three) times daily. 11/15/20  Yes Brayson Livesey K, PA-C  ondansetron (ZOFRAN ODT) 4 MG disintegrating tablet Take 1 tablet (4 mg total) by mouth every 8 (eight) hours as needed for up to 15 doses for nausea or vomiting. 10/31/20  Yes 12/31/20, MD  pantoprazole (PROTONIX) 20 MG tablet Take 1 tablet (20 mg total) by mouth daily. 10/31/20 11/30/20 Yes Trifan, 01/30/21, MD  dicyclomine (BENTYL) 20 MG tablet Take 1 tablet (20 mg total) by mouth 2 (two) times daily. Patient not taking: No sig reported 04/06/20   Couture, Cortni S, PA-C  famotidine (PEPCID) 20 MG tablet Take 1 tablet (20 mg total) by mouth 2 (two) times daily. Patient not taking: No sig reported 04/06/20   Couture, Cortni S, PA-C  ondansetron (ZOFRAN) 4 MG tablet Take 1 tablet (4 mg total) by mouth every 6 (six) hours. Patient not taking: No sig reported 04/06/20   Couture, Cortni S, PA-C  metoCLOPramide (REGLAN) 10 MG tablet Take 1 tablet (10 mg total) by mouth every 8 (eight) hours as needed for nausea or vomiting. Patient not taking: Reported on 11/20/2017 04/18/16 09/16/19  09/18/19, MD    Family History Family History  Problem Relation Age of Onset  . Diabetes Mother   .  Hypertension Mother   . Asthma Mother   . Cancer Father   . Stroke Father   . Thyroid disease Father     Social History Social History   Tobacco Use  . Smoking status: Current Every Day Smoker    Packs/day: 0.00    Types: Cigarettes  . Smokeless tobacco: Never Used  . Tobacco comment: 5 cigarettes per day  Vaping Use  . Vaping Use: Every day  . Substances: Nicotine, Flavoring  Substance Use Topics  . Alcohol use: Yes    Alcohol/week: 24.0 standard drinks    Types: 24 Cans of beer per week    Comment: weekly- states has not drank in 3 weeks  . Drug use: Yes    Types: Marijuana     Allergies   Dilaudid [hydromorphone hcl]   Review of  Systems Review of Systems  Constitutional: Positive for activity change. Negative for appetite change, fatigue and fever.  Respiratory: Negative for cough and shortness of breath.   Cardiovascular: Negative for chest pain.  Gastrointestinal: Negative for abdominal pain, diarrhea, nausea and vomiting.  Musculoskeletal: Positive for arthralgias. Negative for gait problem, joint swelling, myalgias and neck pain.  Neurological: Positive for weakness (intermittent right hand) and numbness (intermittent right hand). Negative for dizziness, light-headedness and headaches.     Physical Exam Triage Vital Signs ED Triage Vitals  Enc Vitals Group     BP 11/15/20 1239 126/77     Pulse Rate 11/15/20 1239 69     Resp 11/15/20 1239 18     Temp 11/15/20 1239 98.4 F (36.9 C)     Temp src --      SpO2 11/15/20 1239 100 %     Weight --      Height --      Head Circumference --      Peak Flow --      Pain Score 11/15/20 1235 10     Pain Loc --      Pain Edu? --      Excl. in GC? --    No data found.  Updated Vital Signs BP 126/77   Pulse 69   Temp 98.4 F (36.9 C)   Resp 18   SpO2 100%   Visual Acuity Right Eye Distance:   Left Eye Distance:   Bilateral Distance:    Right Eye Near:   Left Eye Near:    Bilateral Near:     Physical Exam Vitals reviewed.  Constitutional:      General: He is awake.     Appearance: Normal appearance. He is normal weight. He is not ill-appearing.     Comments: Very pleasant male appears stated age in no acute distress  HENT:     Head: Normocephalic and atraumatic.  Cardiovascular:     Rate and Rhythm: Normal rate and regular rhythm.     Pulses:          Radial pulses are 2+ on the right side and 2+ on the left side.     Heart sounds: No murmur heard.     Comments: Capillary refill within 2 seconds bilateral hands Pulmonary:     Effort: Pulmonary effort is normal.     Breath sounds: Normal breath sounds. No stridor. No wheezing, rhonchi or  rales.     Comments: Clear to auscultation bilaterally Musculoskeletal:     Right shoulder: Tenderness present. No swelling, deformity or bony tenderness. Decreased range of motion. Normal strength. Normal pulse.  Right upper arm: Tenderness present. No deformity or bony tenderness.     Comments: Right arm: Decreased range of motion with overhead flexion, abduction, external rotation.  No deformity noted of right shoulder.  Significant tenderness palpation along triceps.  No pain percussion of cervical vertebrae.  No tenderness palpation of trapezius or paraspinal muscles.  Strength 5/5 at shoulder, elbow, hand.  Normal pincer grip strength.  Hand neurovascularly intact.  Neurological:     Mental Status: He is alert.  Psychiatric:        Behavior: Behavior is cooperative.      UC Treatments / Results  Labs (all labs ordered are listed, but only abnormal results are displayed) Labs Reviewed - No data to display  EKG   Radiology DG Shoulder Right  Result Date: 11/15/2020 CLINICAL DATA:  Injury 3 weeks ago.  Question dislocation yesterday EXAM: RIGHT SHOULDER - 2+ VIEW COMPARISON:  02/12/2014 FINDINGS: There is no evidence of fracture or dislocation. There is no evidence of arthropathy or other focal bone abnormality. Soft tissues are unremarkable. IMPRESSION: Negative. Electronically Signed   By: Marlan Palau M.D.   On: 11/15/2020 13:44    Procedures Procedures (including critical care time)  Medications Ordered in UC Medications - No data to display  Initial Impression / Assessment and Plan / UC Course  I have reviewed the triage vital signs and the nursing notes.  Pertinent labs & imaging results that were available during my care of the patient were reviewed by me and considered in my medical decision making (see chart for details).     X-ray showed no acute findings.  Suspect tricep muscle injury.  Patient was encouraged to limit strenuous physical activity.  He was  prescribed ibuprofen 800 mg to be taken 3 times a day with instruction not to take additional NSAIDs including aspirin, ibuprofen/Advil, naproxen/Aleve due to risk of GI bleeding.  We will have him follow-up with orthopedics for further evaluation and management.  Strict return precautions given to which patient expressed understanding.  Final Clinical Impressions(s) / UC Diagnoses   Final diagnoses:  Right shoulder pain  Injury of right shoulder, initial encounter  Acute pain of right shoulder     Discharge Instructions     Your x-ray was normal.  I am concerned about a muscle injury.  We will try ibuprofen 800 mg 3 times daily.  He should not take additional NSAIDs (aspirin, ibuprofen/Advil, naproxen/Aleve with this medication can lead to stomach bleeding.  Please try to rest your arm.  Follow-up with Ortho as we discussed.  If you have any worsening symptoms you need to be reevaluated.    ED Prescriptions    Medication Sig Dispense Auth. Provider   ibuprofen (ADVIL) 800 MG tablet Take 1 tablet (800 mg total) by mouth 3 (three) times daily. 21 tablet Saliah Crisp, Noberto Retort, PA-C     PDMP not reviewed this encounter.   Jeani Hawking, PA-C 11/15/20 1355

## 2020-11-19 ENCOUNTER — Ambulatory Visit: Payer: Self-pay

## 2020-11-19 ENCOUNTER — Other Ambulatory Visit: Payer: Self-pay

## 2020-11-19 ENCOUNTER — Encounter: Payer: Self-pay | Admitting: Family Medicine

## 2020-11-19 ENCOUNTER — Ambulatory Visit: Payer: Self-pay | Admitting: Family Medicine

## 2020-11-19 DIAGNOSIS — M25511 Pain in right shoulder: Secondary | ICD-10-CM

## 2020-11-19 NOTE — Progress Notes (Signed)
Office Visit Note   Patient: Christopher Estes           Date of Birth: Jan 24, 1986           MRN: [id number] Visit Date: 11/19/2020 Requested by: No referring provider defined for this encounter. PCP: Patient, No Pcp Per (Inactive)  Subjective: Chief Complaint  Patient presents with  . Right Shoulder - Pain, Injury    DOI 11/12/20 Felt a "tearing sensation" in the right shoulder/upper arm while playing hockey. He had also injured that arm 3 weeks earlier, playing hockey. Was seen in the ED & had xrays - see note. Pain in the posterior upper arm.    HPI: He is here with right shoulder pain.  3 weeks ago he decided to start playing hockey again after a 6-year period where he stopped due to multiple concussions.  The first day he played, he slammed into the boards and injured his shoulder.  After a few days his symptoms resolved and he went back to playing again, and then 1 week ago he lost his balance and fell, with his right arm outstretched he hit the ice and felt as though his shoulder had popped out of place and then popped back in.  Immediate pain, went to the ER where x-rays were negative for fracture.  He was discharged home but he continues to have significant pain on the posterior aspect.  Very difficult to raise his arm overhead.  He is ambidextrous, currently working as a Chiropodist.  He has a history of partial tear of his right biceps tendon proximally, which healed with conservative therapy.  Incidentally, he has decided to retire from hockey.                ROS: He is otherwise in good health.  All other systems were reviewed and are negative.  Objective: Vital Signs: There were no vitals taken for this visit.  Physical Exam:  General:  Alert and oriented, in no acute distress. Pulm:  Breathing unlabored. Psy:  Normal mood, congruent affect. Skin: There is slight bruising on the posterior aspect of his shoulder. Right shoulder: Isometric rotator cuff strength testing does  not elicit much pain.  He has quite a bit of pain with triceps extension, also pain with biceps flexion.  There is substantial tenderness on the posterior inferior aspect of his shoulder joint.  Imaging: US Guided Needle Placement - No Linked Charges  Result Date: 11/19/2020 Limited diagnostic ultrasound of the right shoulder was performed but no images were captured.  I believe he has a posterior labrum tear and possibly a small Bankart lesion.  Rotator cuff appears to be intact.  Triceps muscle appears to be intact.   Assessment & Plan: 1.  Right posterior shoulder pain, suspicious for subluxation resulting in labrum tear. -We discussed options, he does not want to pursue MRI scan and surgery right now because he has no insurance and he does not plan to return to hockey activities. -He will rest the shoulder for the next 3 to 4 weeks, doing pendulum exercises to prevent stiffness.  Over-the-counter shoulder sling as needed. -As pain improves, he will start doing rotator cuff strengthening exercises.  If at any point he takes a turn for the worse, he will call me and I will order MRI arthrogram.     Procedures: No procedures performed        PMFS History: Patient Active Problem List   Diagnosis Date Noted  . Tobacco dependence  08/05/2014  . Hyperlipidemia 06/11/2014  . IBS (irritable bowel syndrome) 06/11/2014   Past Medical History:  Diagnosis Date  . Hyperlipidemia   . Insomnia   . Migraine     Family History  Problem Relation Age of Onset  . Diabetes Mother   . Hypertension Mother   . Asthma Mother   . Cancer Father   . Stroke Father   . Thyroid disease Father     Past Surgical History:  Procedure Laterality Date  . INNER EAR SURGERY    . TONSILLECTOMY     Social History   Occupational History  . Not on file  Tobacco Use  . Smoking status: Current Every Day Smoker    Packs/day: 0.00    Types: Cigarettes  . Smokeless tobacco: Never Used  . Tobacco  comment: 5 cigarettes per day  Vaping Use  . Vaping Use: Every day  . Substances: Nicotine, Flavoring  Substance and Sexual Activity  . Alcohol use: Yes    Alcohol/week: 24.0 standard drinks    Types: 24 Cans of beer per week    Comment: weekly- states has not drank in 3 weeks  . Drug use: Yes    Types: Marijuana  . Sexual activity: Not on file

## 2020-12-13 ENCOUNTER — Inpatient Hospital Stay: Admission: RE | Admit: 2020-12-13 | Payer: Self-pay | Source: Ambulatory Visit

## 2020-12-13 ENCOUNTER — Other Ambulatory Visit: Payer: Self-pay

## 2021-02-12 IMAGING — CT CT ABD-PELV W/ CM
2 of 4 series · 15 of 46 positions shown, 17 images · IV contrast (omnipaque)
Comparison: November 20, 2017

CLINICAL DATA: Abdominal pain

EXAM:
CT ABDOMEN AND PELVIS WITH CONTRAST
TECHNIQUE: Multidetector CT imaging of the abdomen and pelvis was performed
using the standard protocol following bolus administration of
intravenous contrast.
CONTRAST:  100mL OMNIPAQUE IOHEXOL 300 MG/ML  SOLN

[Series 2: axial st · axial · 0.90mm/px · z∈[+899,+1389]mm · 12 of 110 slices shown, 14 images]
[im 6/110  soft-tissue]
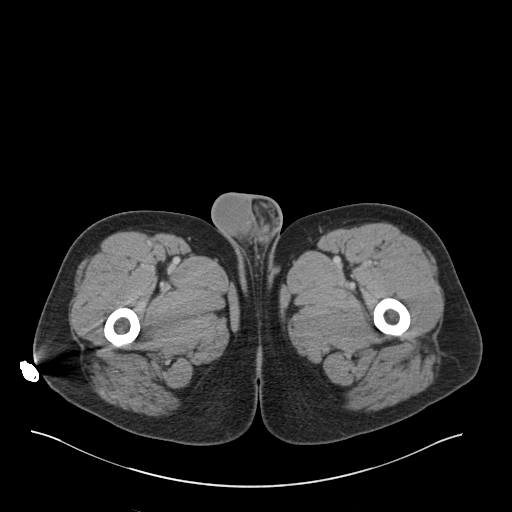
[im 6/110  bone]
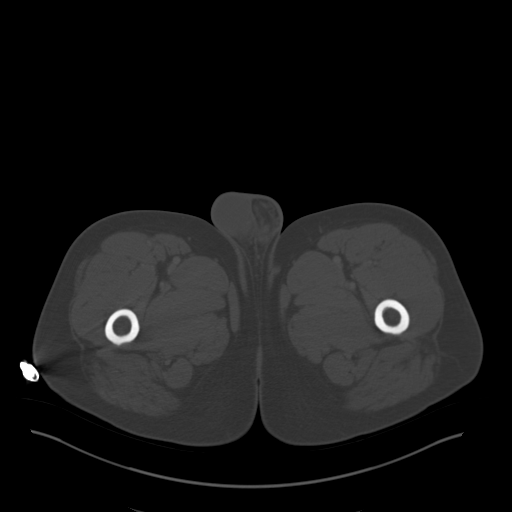
[im 17/110  soft-tissue]
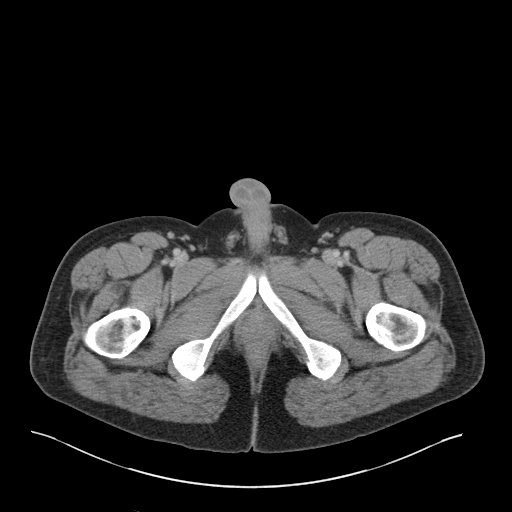
[im 22/110  soft-tissue]
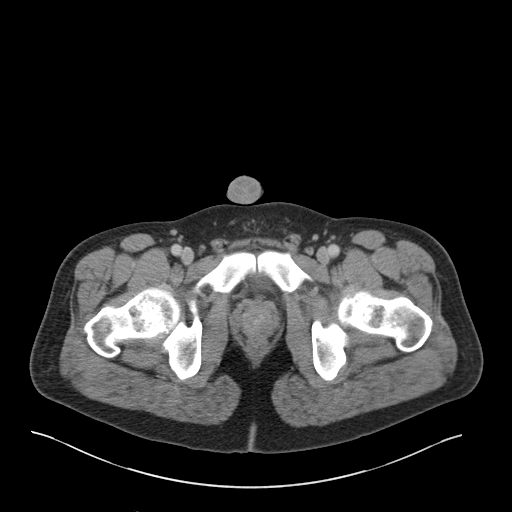
[im 33/110  soft-tissue]
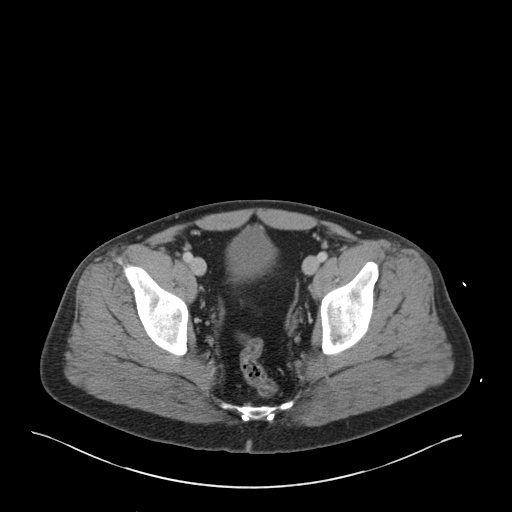
[im 44/110  soft-tissue]
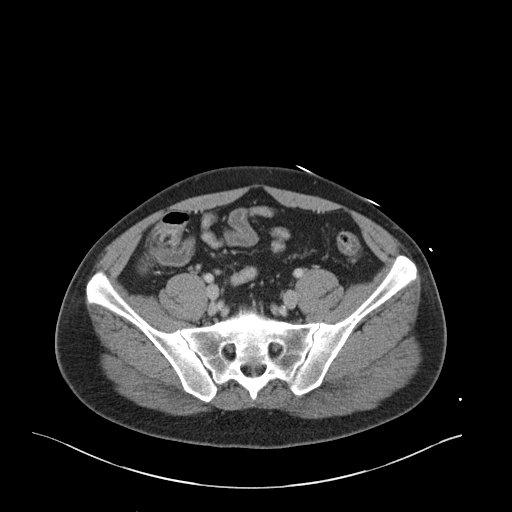
[im 50/110  soft-tissue]
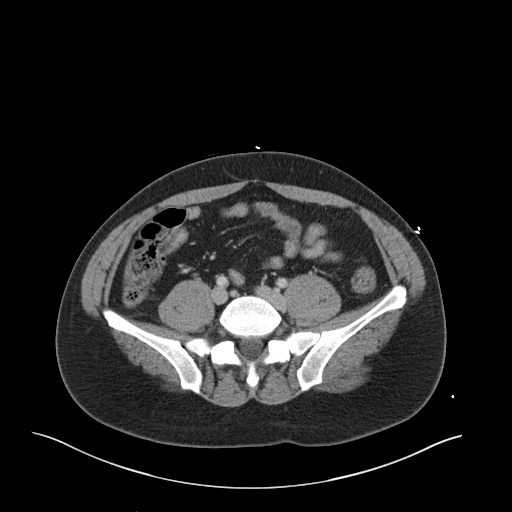
[im 60/110  soft-tissue]
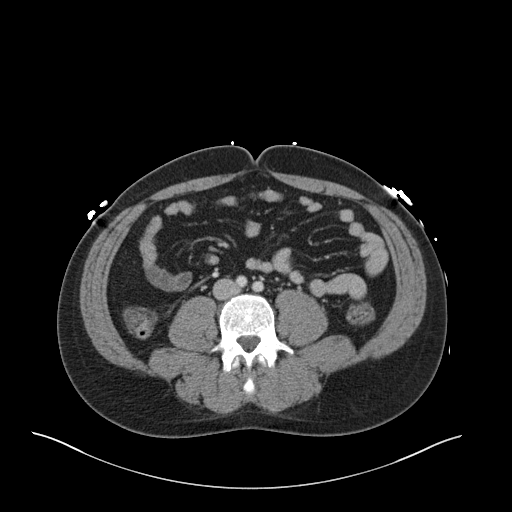
[im 66/110  soft-tissue]
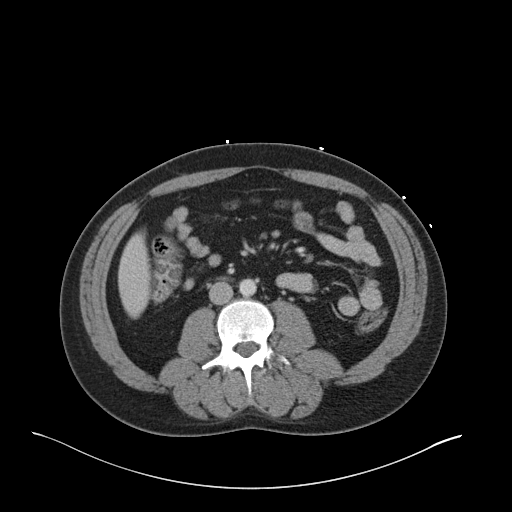
[im 77/110  soft-tissue]
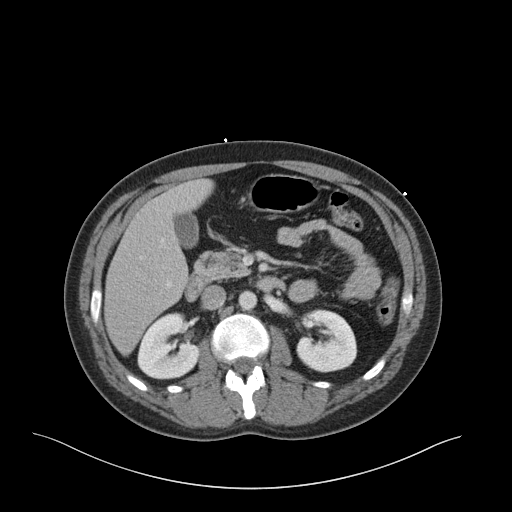
[im 77/110  bone]
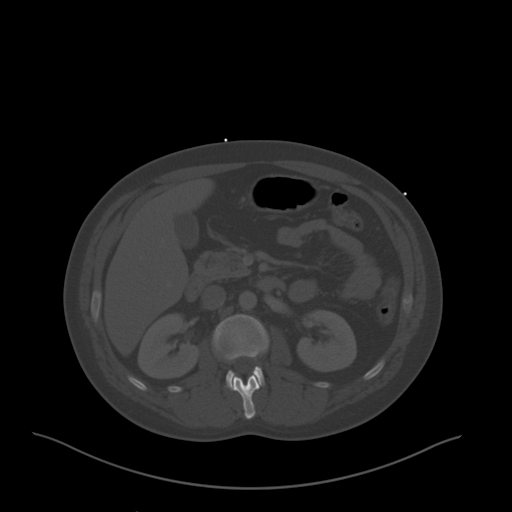
[im 88/110  soft-tissue]
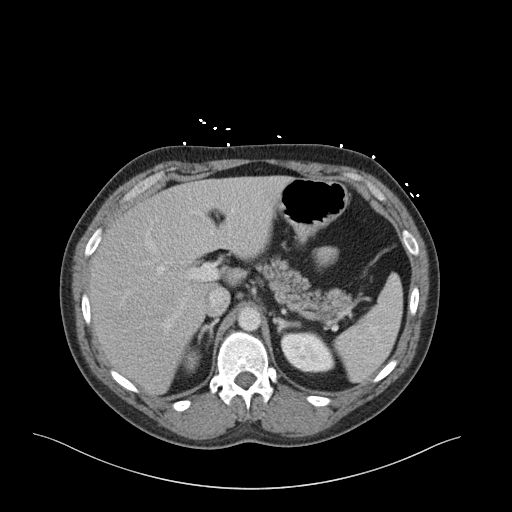
[im 93/110  soft-tissue]
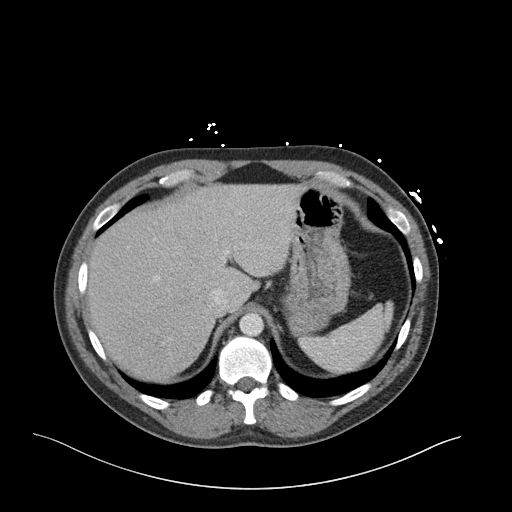
[im 104/110  soft-tissue]
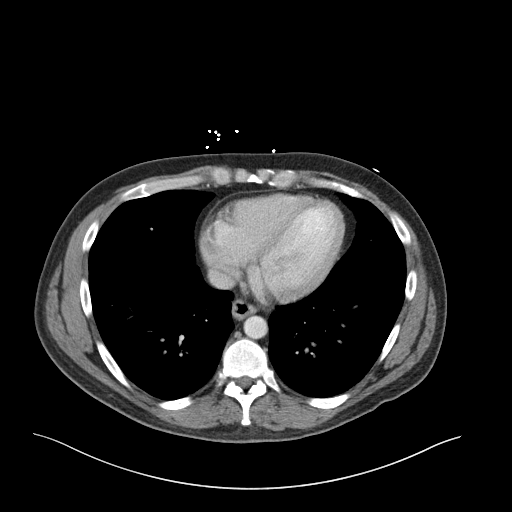

[Series 4: coronal st · coronal · 0.71mm/px · 3 of 147 slices shown]
[im 49/147  soft-tissue]
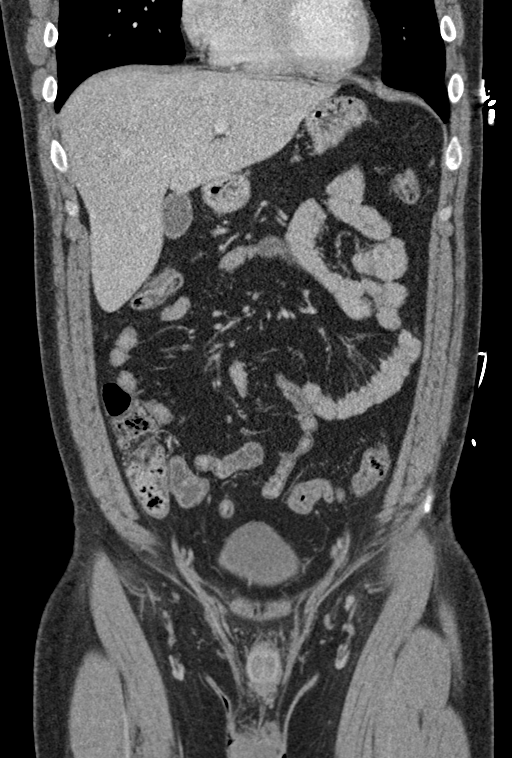
[im 65/147  soft-tissue]
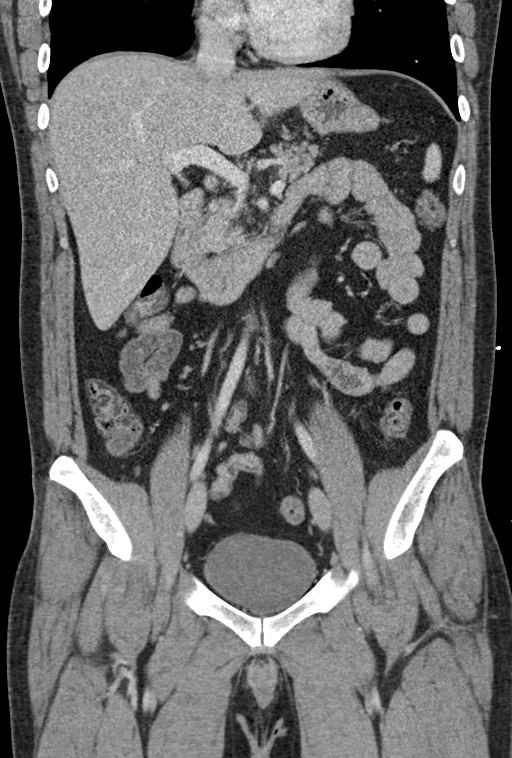
[im 82/147  soft-tissue]
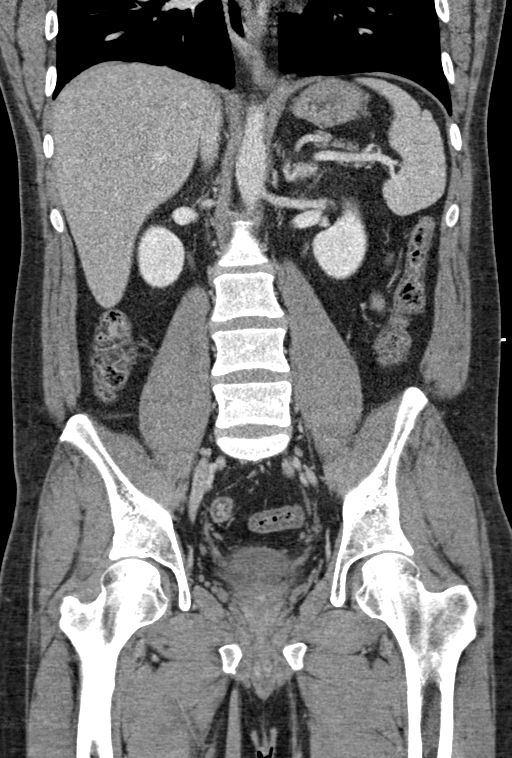

[15 of 46 positions shown; findings below may reference images not displayed]

FINDINGS: Lower chest: Lung bases are clear.

Hepatobiliary: No focal liver lesions are appreciable. Gallbladder
wall is not appreciably thickened. There is no biliary duct
dilatation.

Pancreas: There is no pancreatic mass or inflammatory focus.

Spleen: No splenic lesions are evident.  Small splenules noted.

Adrenals/Urinary Tract: Adrenals bilaterally appear normal. Kidneys
bilaterally show no evident mass or hydronephrosis on either side.
There is no renal or ureteral calculus on either side. Urinary
bladder is midline with wall thickness within normal limits.

Stomach/Bowel: There is no appreciable bowel wall or mesenteric
thickening. There is no evident bowel obstruction. The terminal
ileum appears normal. There is no evident free air or portal venous
air.

Vascular/Lymphatic: No abdominal aortic aneurysm. No arterial
vascular lesions evident. Major venous structures appear patent.
There is no evident adenopathy in the abdomen or pelvis.

Reproductive: Prostate and seminal vesicles are normal in size and
contour. No evident pelvic mass.

Other: Appendix appears normal. No evident abscess or ascites in the
abdomen or pelvis.

Musculoskeletal: No blastic or lytic bone lesions. No intramuscular
or abdominal wall lesions are evident.
IMPRESSION: 1. A cause for patient's symptoms has not been established with this
study.

2. No bowel wall thickening or bowel obstruction. No abscess in the
abdomen pelvis. Appendix appears normal.

3. No renal or ureteral calculus. No hydronephrosis. Urinary bladder
wall thickness within normal limits.

## 2022-03-09 ENCOUNTER — Emergency Department (HOSPITAL_COMMUNITY)
Admission: EM | Admit: 2022-03-09 | Discharge: 2022-03-09 | Disposition: A | Discharge status: Home / Self Care | Payer: Self-pay | Attending: Emergency Medicine | Admitting: Emergency Medicine

## 2022-03-09 ENCOUNTER — Emergency Department (HOSPITAL_COMMUNITY): Payer: Self-pay

## 2022-03-09 ENCOUNTER — Encounter (HOSPITAL_COMMUNITY): Payer: Self-pay

## 2022-03-09 DIAGNOSIS — R112 Nausea with vomiting, unspecified: Secondary | ICD-10-CM | POA: Insufficient documentation

## 2022-03-09 DIAGNOSIS — F1090 Alcohol use, unspecified, uncomplicated: Secondary | ICD-10-CM

## 2022-03-09 DIAGNOSIS — R45851 Suicidal ideations: Secondary | ICD-10-CM | POA: Insufficient documentation

## 2022-03-09 DIAGNOSIS — R55 Syncope and collapse: Secondary | ICD-10-CM | POA: Insufficient documentation

## 2022-03-09 DIAGNOSIS — F1020 Alcohol dependence, uncomplicated: Secondary | ICD-10-CM | POA: Insufficient documentation

## 2022-03-09 DIAGNOSIS — F10939 Alcohol use, unspecified with withdrawal, unspecified: Secondary | ICD-10-CM

## 2022-03-09 DIAGNOSIS — R072 Precordial pain: Secondary | ICD-10-CM | POA: Insufficient documentation

## 2022-03-09 HISTORY — DX: Alcohol dependence, uncomplicated: F10.20

## 2022-03-09 LAB — PROTIME-INR
INR: 1.1 (ref 0.8–1.2)
Prothrombin Time: 13.7 seconds (ref 11.4–15.2)

## 2022-03-09 LAB — COMPREHENSIVE METABOLIC PANEL
ALT: 30 U/L (ref 0–44)
AST: 21 U/L (ref 15–41)
Albumin: 4.9 g/dL (ref 3.5–5.0)
Alkaline Phosphatase: 51 U/L (ref 38–126)
Anion gap: 16 — ABNORMAL HIGH (ref 5–15)
BUN: 22 mg/dL — ABNORMAL HIGH (ref 6–20)
CO2: 20 mmol/L — ABNORMAL LOW (ref 22–32)
Calcium: 10 mg/dL (ref 8.9–10.3)
Chloride: 101 mmol/L (ref 98–111)
Creatinine, Ser: 1.17 mg/dL (ref 0.61–1.24)
GFR, Estimated: >60 mL/min (ref >60)
Glucose, Bld: 188 mg/dL — ABNORMAL HIGH (ref 70–99)
Potassium: 3.6 mmol/L (ref 3.5–5.1)
Sodium: 137 mmol/L (ref 135–145)
Total Bilirubin: 2 mg/dL — ABNORMAL HIGH (ref 0.3–1.2)
Total Protein: 8.5 g/dL — ABNORMAL HIGH (ref 6.5–8.1)

## 2022-03-09 LAB — CBC
HCT: 48.8 % (ref 39.0–52.0)
Hemoglobin: 17.4 g/dL — ABNORMAL HIGH (ref 13.0–17.0)
MCH: 32.1 pg (ref 26.0–34.0)
MCHC: 35.7 g/dL (ref 30.0–36.0)
MCV: 90 fL (ref 80.0–100.0)
Platelets: 420 10*3/uL — ABNORMAL HIGH (ref 150–400)
RBC: 5.42 MIL/uL (ref 4.22–5.81)
RDW: 13 % (ref 11.5–15.5)
WBC: 13.6 10*3/uL — ABNORMAL HIGH (ref 4.0–10.5)
nRBC: 0 % (ref 0.0–0.2)

## 2022-03-09 LAB — ETHANOL: Alcohol, Ethyl (B): <10 mg/dL (ref <10)

## 2022-03-09 LAB — LIPASE, BLOOD: Lipase: 25 U/L (ref 11–51)

## 2022-03-09 LAB — TROPONIN I (HIGH SENSITIVITY): Troponin I (High Sensitivity): 6 ng/L (ref <18)

## 2022-03-09 LAB — BETA-HYDROXYBUTYRIC ACID: Beta-Hydroxybutyric Acid: 0.58 mmol/L — ABNORMAL HIGH (ref 0.05–0.27)

## 2022-03-09 LAB — SALICYLATE LEVEL: Salicylate Lvl: <7 mg/dL — ABNORMAL LOW (ref 7.0–30.0)

## 2022-03-09 LAB — ACETAMINOPHEN LEVEL: Acetaminophen (Tylenol), Serum: <10 ug/mL — ABNORMAL LOW (ref 10–30)

## 2022-03-09 MED ORDER — ALUM & MAG HYDROXIDE-SIMETH 200-200-20 MG/5ML PO SUSP: 30.0000 mL | Freq: Four times a day (QID) | ORAL | Status: DC | PRN

## 2022-03-09 MED ORDER — LORAZEPAM 1 MG PO TABS: 0.0000 mg | ORAL_TABLET | Freq: Four times a day (QID) | ORAL | Status: DC

## 2022-03-09 MED ORDER — DEXTROSE-NACL 5-0.9 % IV SOLN: INTRAVENOUS | Status: DC

## 2022-03-09 MED ORDER — THIAMINE HCL 100 MG/ML IJ SOLN
100.0000 mg | Freq: Once | INTRAMUSCULAR | Status: AC
  Administered 2022-03-09: 100 mg via INTRAVENOUS
  Filled 2022-03-09: qty 2

## 2022-03-09 MED ORDER — LORAZEPAM 1 MG PO TABS: 0.0000 mg | ORAL_TABLET | Freq: Two times a day (BID) | ORAL | Status: DC

## 2022-03-09 MED ORDER — ONDANSETRON HCL 4 MG PO TABS
4.0000 mg | ORAL_TABLET | Freq: Three times a day (TID) | ORAL | Status: DC | PRN
Start: 2022-03-09 — End: 2022-03-09

## 2022-03-09 MED ORDER — LORAZEPAM 2 MG/ML IJ SOLN: 0.0000 mg | Freq: Four times a day (QID) | INTRAMUSCULAR | Status: DC

## 2022-03-09 MED ORDER — LORAZEPAM 2 MG/ML IJ SOLN: 0.0000 mg | Freq: Two times a day (BID) | INTRAMUSCULAR | Status: DC

## 2022-03-09 MED ORDER — THIAMINE HCL 100 MG PO TABS: 100.0000 mg | ORAL_TABLET | Freq: Every day | ORAL | 0 refills | Status: AC

## 2022-03-09 MED ORDER — ONDANSETRON HCL 4 MG PO TABS: 4.0000 mg | ORAL_TABLET | Freq: Four times a day (QID) | ORAL | 0 refills | Status: DC

## 2022-03-09 MED ORDER — THIAMINE HCL 100 MG PO TABS: 100.0000 mg | ORAL_TABLET | Freq: Every day | ORAL | Status: DC

## 2022-03-09 MED ORDER — FAMOTIDINE IN NACL 20-0.9 MG/50ML-% IV SOLN
20.0000 mg | Freq: Once | INTRAVENOUS | Status: AC
  Administered 2022-03-09: 20 mg via INTRAVENOUS
  Filled 2022-03-09: qty 50

## 2022-03-09 MED ORDER — ONDANSETRON HCL 4 MG/2ML IJ SOLN
4.0000 mg | Freq: Once | INTRAMUSCULAR | Status: AC
  Administered 2022-03-09: 4 mg via INTRAVENOUS
  Filled 2022-03-09: qty 2

## 2022-03-09 MED ORDER — THIAMINE HCL 100 MG/ML IJ SOLN: 100.0000 mg | Freq: Every day | INTRAMUSCULAR | Status: DC

## 2022-03-09 MED ORDER — ACETAMINOPHEN 325 MG PO TABS: 650.0000 mg | ORAL_TABLET | ORAL | Status: DC | PRN

## 2022-03-09 MED ORDER — ACETAMINOPHEN 325 MG PO TABS
650.0000 mg | ORAL_TABLET | Freq: Once | ORAL | Status: AC
  Administered 2022-03-09: 650 mg via ORAL
  Filled 2022-03-09: qty 2

## 2022-03-09 NOTE — ED Triage Notes (Signed)
Pt arrives via [facility] from home for multiple syncopal episodes. Pt states that he drinks 12-15 beers per day, last drink two days ago. Pt has since had multiple episodes of emesis. During triage pt also stating feelings of hopelessness and SI with multiple plans.

## 2022-03-09 NOTE — ED Notes (Signed)
Patient resting quietly in NAD, with sitter at bedside.

## 2022-03-09 NOTE — BH Assessment (Signed)
[facility] Assessment Progress Note   Per Starleen Blue, NP, this voluntary pt does not require psychiatric hospitalization at this time.  Pt is psychiatrically cleared.  Discharge instructions include referral information for [facility] [place], which this Clinical research associate has discussed with pt in person.  EDP Arturo Morton, DO and pt's nurse, Oneta Rack, have been notified.  Doylene Canning, MA Triage Specialist [phone]

## 2022-03-09 NOTE — Consult Note (Addendum)
[facility] ED ASSESSMENT   Reason for Consult:  SI & Alcohol abuse  Referring Physician:  Phoebe Sharps, DO  Patient Identification: Christopher Estes MRN:  [id number] ED Chief Complaint: Alcohol use disorder  Diagnosis:  Principal Problem:   Alcohol use disorder   ED Assessment Time Calculation: Start Time: 1540 Stop Time: 1645 Total Time in Minutes (Assessment Completion): 65   HPI: Christopher Estes is a 36 y.o. male with a history of MDD and alcohol use disorder who presented voluntarily to the [facility] by [facility] for nausea, vomiting and syncopal episodes in the context of alcohol withdrawal. The psychiatry was consulted because pt endorsed +SI, and stated that he had thought about taking pills or shooting himself.  Face to face assessment: On assessment, pt reports that he started drinking alcohol at the age of 27, and currently drinks 12-15 beers daily, with occasional drinks of liquor. He reports that he has not had any sobriety since he started drinking alcohol, and has had one blackout in his 63s, but no seizures related to alcohol use. He also reports that he uses a gram of THC daily, and started using THC at age 89, and has not stopped since then. He reports that he is not interested in substance use rehab because he does not intend to stop using alcohol and does not intent to stop using THC "because it has several benefits and many medicinal purposes and I like the way it makes me feel."   Pt reports being diagnosed with MDD several years ago, and was prescribed Prozac, but stopped because it made him feel like "a zombie". He states that this was in his 6s, and he has not been on any psychotropic medications since then. Pt reports his current stressor as homelessness, and states that his fiance kicked him out of the trailer than they shared together and brought another man in, and he has been homeless for a month now. Pt denies any past suicide attempts, denies a history of self injurious  behaviors.  Pt presents with a euthymic mood & affect is congruent. His attention to personal hygiene and grooming is fair, eye contact is good, speech is clear & coherent. Thought contents are organized and logical, and pt currently denies SI/HI/AVH. He reports some paranoia, but states "I have always been like that. I do not like people around me, because I do not know what they will do". There is no evidence of delusional thoughts. Based on my assessment of this patient, he does not meet criteria for inpatient hospitalization, and has been provided housing resources and resources for outpatient medication management. Case discussed with Dr. Lucianne Muss who is agreeable with plan.  Past Psychiatric History: MDD, ETOH abuse, THC abuse  Risk to Self or Others:no Is the patient at risk to self? No Has the patient been a risk to self in the past 6 months? No Has the patient been a risk to self within the distant past? No Is the patient a risk to others? No Has the patient been a risk to others in the past 6 months? No Has the patient been a risk to others within the distant past? No  [place] Scale:  Flowsheet Row ED from 03/09/2022 in [facility] from 11/15/2020 in [facility] ED from 10/31/2020 in [facility]  C-SSRS RISK CATEGORY Low Risk Error: Question 6 not populated No Risk      AIMS:  , , ,  ,  ASAM:    Substance Abuse:   yes  Past Medical History:  Past Medical History:  Diagnosis Date   Alcoholism (HCC)    Hyperlipidemia    Insomnia    Migraine     Past Surgical History:  Procedure Laterality Date   INNER EAR SURGERY     TONSILLECTOMY     Family History:  Family History  Problem Relation Age of Onset   Diabetes Mother    Hypertension Mother    Asthma Mother    Cancer Father    Stroke Father    Thyroid disease Father    Family Psychiatric  History: none provided Social  History:  Social History   Substance and Sexual Activity  Alcohol Use Yes   Alcohol/week: 24.0 standard drinks of alcohol   Types: 24 Cans of beer per week   Comment: weekly- states has not drank in 3 weeks     Social History   Substance and Sexual Activity  Drug Use Yes   Types: Marijuana    Social History   Socioeconomic History   Marital status: Divorced    Spouse name: Not on file   Number of children: Not on file   Years of education: Not on file   Highest education level: Not on file  Occupational History   Not on file  Tobacco Use   Smoking status: Every Day    Packs/day: 0.00    Types: Cigarettes   Smokeless tobacco: Never   Tobacco comments:    5 cigarettes per day  Vaping Use   Vaping Use: Every day   Substances: Nicotine, Flavoring  Substance and Sexual Activity   Alcohol use: Yes    Alcohol/week: 24.0 standard drinks of alcohol    Types: 24 Cans of beer per week    Comment: weekly- states has not drank in 3 weeks   Drug use: Yes    Types: Marijuana   Sexual activity: Not on file  Other Topics Concern   Not on file  Social History Narrative   Not on file   Social Determinants of Health   Financial Resource Strain: Not on file  Food Insecurity: Not on file  Transportation Needs: Not on file  Physical Activity: Not on file  Stress: Not on file  Social Connections: Not on file   Additional Social History:   Allergies:   Allergies  Allergen Reactions   Dilaudid [Hydromorphone Hcl] Itching    Facial itching    Labs:  Results for orders placed or performed during the hospital encounter of 03/09/22 (from the past 48 hour(s))  Comprehensive metabolic panel     Status: Abnormal   Collection Time: 03/09/22 11:12 AM  Result Value Ref Range   Sodium 137 135 - 145 mmol/L   Potassium 3.6 3.5 - 5.1 mmol/L   Chloride 101 98 - 111 mmol/L   CO2 20 (L) 22 - 32 mmol/L   Glucose, Bld 188 (H) 70 - 99 mg/dL    Comment: Glucose reference range applies  only to samples taken after fasting for at least 8 hours.   BUN 22 (H) 6 - 20 mg/dL   Creatinine, Ser 0.92 0.61 - 1.24 mg/dL   Calcium 33.0 8.9 - 07.6 mg/dL   Total Protein 8.5 (H) 6.5 - 8.1 g/dL   Albumin 4.9 3.5 - 5.0 g/dL   AST 21 15 - 41 U/L   ALT 30 0 - 44 U/L   Alkaline Phosphatase 51 38 - 126 U/L  Total Bilirubin 2.0 (H) 0.3 - 1.2 mg/dL   GFR, Estimated >16>60 >10>60 mL/min    Comment: (NOTE) Calculated using the CKD-EPI Creatinine Equation (2021)    Anion gap 16 (H) 5 - 15    Comment: Performed at [facility], [address] [place], [place] [address]  Ethanol     Status: None   Collection Time: 03/09/22 11:12 AM  Result Value Ref Range   Alcohol, Ethyl (B) <10 <10 mg/dL    Comment: (NOTE) Lowest detectable limit for serum alcohol is 10 mg/dL.  For medical purposes only. Performed at [facility], [address] [place], [place] [address]   Salicylate level     Status: Abnormal   Collection Time: 03/09/22 11:12 AM  Result Value Ref Range   Salicylate Lvl <7.0 (L) 7.0 - 30.0 mg/dL    Comment: Performed at [facility], [address] [place], [place] [address]  Acetaminophen level     Status: Abnormal   Collection Time: 03/09/22 11:12 AM  Result Value Ref Range   Acetaminophen (Tylenol), Serum <10 (L) 10 - 30 ug/mL    Comment: (NOTE) Therapeutic concentrations vary significantly. A range of 10-30 ug/mL  may be an effective concentration for many patients. However, some  are best treated at concentrations outside of this range. Acetaminophen concentrations >150 ug/mL at 4 hours after ingestion  and >50 ug/mL at 12 hours after ingestion are often associated with  toxic reactions.  Performed at [facility], [address] [place], [place] [address]   cbc     Status: Abnormal   Collection Time: 03/09/22 11:12 AM  Result Value Ref Range   WBC 13.6 (H) 4.0 - 10.5 K/uL   RBC 5.42 4.22 -  5.81 MIL/uL   Hemoglobin 17.4 (H) 13.0 - 17.0 g/dL   HCT 21.348.8 08.639.0 - 57.852.0 %   MCV 90.0 80.0 - 100.0 fL   MCH 32.1 26.0 - 34.0 pg   MCHC 35.7 30.0 - 36.0 g/dL   RDW 46.913.0 62.911.5 - 52.815.5 %   Platelets 420 (H) 150 - 400 K/uL   nRBC 0.0 0.0 - 0.2 %    Comment: Performed at [facility], [address] [place], [place] [address]  Lipase, blood     Status: None   Collection Time: 03/09/22 11:58 AM  Result Value Ref Range   Lipase 25 11 - 51 U/L    Comment: Performed at [facility], [address] [place], [place] [address]  Troponin I (High Sensitivity)     Status: None   Collection Time: 03/09/22 11:58 AM  Result Value Ref Range   Troponin I (High Sensitivity) 6 <18 ng/L    Comment: (NOTE) Elevated high sensitivity troponin I (hsTnI) values and significant  changes across serial measurements may suggest ACS but many other  chronic and acute conditions are known to elevate hsTnI results.  Refer to the "Links" section for chest pain algorithms and additional  guidance. Performed at [facility], [address] [place], [place] [address]   Beta-hydroxybutyric acid     Status: Abnormal   Collection Time: 03/09/22 11:58 AM  Result Value Ref Range   Beta-Hydroxybutyric Acid 0.58 (H) 0.05 - 0.27 mmol/L    Comment: Performed at [facility], [address] [place], [place] [address]  Protime-INR     Status: None   Collection Time: 03/09/22 11:58 AM  Result Value Ref  Range   Prothrombin Time 13.7 11.4 - 15.2 seconds   INR 1.1 0.8 - 1.2    Comment: (NOTE) INR goal varies based on device and disease states. Performed at [facility], [address] [place], [place] [address]     Current Facility-Administered Medications  Medication Dose Route Frequency Provider Last Rate Last Admin   acetaminophen (TYLENOL) tablet 650 mg  650 mg Oral Q4H PRN Kneller, Victoria K, DO       alum & mag  hydroxide-simeth (MAALOX/MYLANTA) 200-200-20 MG/5ML suspension 30 mL  30 mL Oral Q6H PRN Kneller, Victoria K, DO       dextrose 5 %-0.9 % sodium chloride infusion   Intravenous Continuous Kneller, Victoria K, DO   Stopped at 03/09/22 1300   LORazepam (ATIVAN) injection 0-4 mg  0-4 mg Intravenous Q6H Kneller, Victoria K, DO       Or   LORazepam (ATIVAN) tablet 0-4 mg  0-4 mg Oral Q6H Kneller, Victoria K, DO       [START ON 03/11/2022] LORazepam (ATIVAN) injection 0-4 mg  0-4 mg Intravenous Q12H Kneller, Victoria K, DO       Or   [START ON 03/11/2022] LORazepam (ATIVAN) tablet 0-4 mg  0-4 mg Oral Q12H Kneller, Victoria K, DO       ondansetron [facility]) tablet 4 mg  4 mg Oral Q8H PRN Kneller, Cecile Sheerer, DO       [START ON 03/10/2022] thiamine (VITAMIN B1) tablet 100 mg  100 mg Oral Daily Kneller, Victoria K, DO       Or   [START ON 03/10/2022] thiamine (VITAMIN B1) injection 100 mg  100 mg Intravenous Daily Phoebe Sharps, DO       Current Outpatient Medications  Medication Sig Dispense Refill   ondansetron (ZOFRAN) 4 MG tablet Take 1 tablet (4 mg total) by mouth every 6 (six) hours. 12 tablet 0   thiamine (VITAMIN B1) 100 MG tablet Take 1 tablet (100 mg total) by mouth daily. 30 tablet 0   famotidine (PEPCID) 20 MG tablet Take 1 tablet (20 mg total) by mouth 2 (two) times daily. (Patient not taking: Reported on 03/09/2022) 60 tablet 1   ibuprofen (ADVIL) 800 MG tablet Take 1 tablet (800 mg total) by mouth 3 (three) times daily. (Patient not taking: Reported on 03/09/2022) 21 tablet 0   pantoprazole (PROTONIX) 20 MG tablet Take 1 tablet (20 mg total) by mouth daily. (Patient not taking: Reported on 03/09/2022) 30 tablet 1    Musculoskeletal: Strength & Muscle Tone: within normal limits Gait & Station: normal Patient leans: N/A   Psychiatric Specialty Exam: Presentation  General Appearance: Appropriate for Environment; Fairly Groomed Eye Contact:Good Speech:Clear and Coherent Speech  Volume:Normal Handedness:Right  Mood and Affect  Mood:Euthymic Affect:Congruent; Appropriate  Thought Process  Thought Processes:Coherent Descriptions of Associations:Intact  Orientation:Full (Time, Place and Person)  Thought Content:Logical  History of Schizophrenia/Schizoaffective disorder:No data recorded Duration of Psychotic Symptoms:No data recorded Hallucinations:Hallucinations: None  Ideas of Reference:Paranoia (states he is always paranoid at baseline)  Suicidal Thoughts:Suicidal Thoughts: No  Homicidal Thoughts:Homicidal Thoughts: No   Sensorium  Memory:Immediate Fair Judgment:Good Insight:Good  Executive Functions  Concentration:Good Attention Span:Good Recall:Good Fund of Knowledge:Good Language:Good  Psychomotor Activity  Psychomotor Activity:Psychomotor Activity: Normal  Assets  Assets:Communication Skills; Resilience  Sleep  Sleep:Sleep: Good  Physical Exam: Physical Exam Constitutional:      Appearance: Normal appearance.  HENT:     Head: Normocephalic.     Nose: Nose normal.  No congestion or rhinorrhea.  Eyes:     Pupils: Pupils are equal, round, and reactive to light.  Pulmonary:     Effort: Pulmonary effort is normal.  Musculoskeletal:        General: Normal range of motion.     Cervical back: Normal range of motion.  Neurological:     Mental Status: He is alert and oriented to person, place, and time.     Sensory: No sensory deficit.     Coordination: Coordination normal.     Deep Tendon Reflexes: Reflexes normal.  Psychiatric:        Behavior: Behavior normal.        Thought Content: Thought content normal.    Review of Systems  Constitutional: Negative.   HENT: Negative.    Eyes: Negative.   Respiratory: Negative.    Cardiovascular: Negative.   Gastrointestinal: Negative.   Genitourinary: Negative.   Skin: Negative.   Neurological: Negative.   Psychiatric/Behavioral:  Positive for substance abuse. Negative for  hallucinations, memory loss and suicidal ideas. The patient is not nervous/anxious and does not have insomnia.    Blood pressure 133/81, pulse 75, temperature 98.6 F (37 C), temperature source Oral, resp. rate 17, height 6' (1.829 m), weight 93.9 kg, SpO2 97 %. Body mass index is 28.07 kg/m.  Medical Decision Making: Discharge from psychiatry service with resources for outpatient f/u for shelter and medication management (Contact information for [facility] [place] provided to pt by CSW).  Disposition: No evidence of imminent risk to self or others at present.   Patient does not meet criteria for psychiatric inpatient admission. Discussed crisis plan, support from social network, calling 911, coming to the Emergency Department, and calling Suicide Hotline.  Starleen Blue, NP 03/09/2022 4:45 PM

## 2022-03-09 NOTE — ED Notes (Signed)
Patient given discharge instructions, all questions answered. Patient in possession of all belongings, directed to the discharge area  

## 2022-03-09 NOTE — ED Provider Notes (Signed)
[facility]-EMERGENCY DEPT Provider Note   CSN: [id number] Arrival date & time: 03/09/22  1056     History  Chief Complaint  Patient presents with   Alcohol Problem   Suicidal   Loss of Consciousness    Christopher Estes is a 36 y.o. male.  Patient is a 36 year old male with a past medical history of alcohol use disorder presenting to the emergency department with nausea, vomiting and syncope.  The patient states that 2 days ago he started to develop nausea and vomiting with associated lower midsternal chest pain.  He states that the chest pain has been constant since it started and feels like a pressure type of pain.  He states his pain is worse with taking a deep breath but does not feel short of breath.  He denies any fevers or chills.  He states that he has had a mild cough.  He states that he saw some specks of blood in his vomit today after vomiting several times.  He denies any associated abdominal pain.  He states that he has not urinated in the last day due to not being able to keep anything down.  States that he has not had a drink in 2 days as well due to the nausea and vomiting.  He denies any history of alcohol withdrawal or alcohol withdrawal seizures.  He states that he has had associated diarrhea.  He denies any prior abdominal surgeries.  Patient is additionally reporting suicidal ideation.  He states he does have a history of depression and has been having intrusive suicidal thoughts.  He states he has thought about taking pills or shooting himself but has not currently done anything to hurt himself.  He denies any HI or command hallucinations.  The history is provided by the patient.  Alcohol Problem  Loss of Consciousness      Home Medications Prior to Admission medications   Medication Sig Start Date End Date Taking? Authorizing Provider  ondansetron (ZOFRAN) 4 MG tablet Take 1 tablet (4 mg total) by mouth every 6 (six) hours. 03/09/22  Yes Augustino Savastano,  [place] K, DO  thiamine (VITAMIN B1) 100 MG tablet Take 1 tablet (100 mg total) by mouth daily. 03/09/22  Yes Agata Lucente, Benetta Spar K, DO  famotidine (PEPCID) 20 MG tablet Take 1 tablet (20 mg total) by mouth 2 (two) times daily. Patient not taking: Reported on 03/09/2022 10/31/20 11/30/20  Terald Sleeper, MD  ibuprofen (ADVIL) 800 MG tablet Take 1 tablet (800 mg total) by mouth 3 (three) times daily. Patient not taking: Reported on 03/09/2022 11/15/20   Raspet, Denny Peon K, PA-C  pantoprazole (PROTONIX) 20 MG tablet Take 1 tablet (20 mg total) by mouth daily. Patient not taking: Reported on 03/09/2022 10/31/20 11/30/20  Terald Sleeper, MD  metoCLOPramide (REGLAN) 10 MG tablet Take 1 tablet (10 mg total) by mouth every 8 (eight) hours as needed for nausea or vomiting. Patient not taking: Reported on 11/20/2017 04/18/16 09/16/19  Azalia Bilis, MD      Allergies    Dilaudid [hydromorphone hcl]    Review of Systems   Review of Systems  Cardiovascular:  Positive for syncope.    Physical Exam Updated Vital Signs BP 133/81 (BP Location: Right Arm)   Pulse 75   Temp 98.6 F (37 C) (Oral)   Resp 17   Ht 6' (1.829 m)   Wt 93.9 kg   SpO2 97%   BMI 28.07 kg/m  Physical Exam Vitals and nursing note  reviewed.  Constitutional:      Appearance: Normal appearance. He is ill-appearing. He is not toxic-appearing.  HENT:     Head: Normocephalic and atraumatic.     Mouth/Throat:     Mouth: Mucous membranes are dry.     Pharynx: Oropharynx is clear.  Eyes:     Extraocular Movements: Extraocular movements intact.     Conjunctiva/sclera: Conjunctivae normal.     Pupils: Pupils are equal, round, and reactive to light.  Cardiovascular:     Rate and Rhythm: Normal rate and regular rhythm.     Pulses: Normal pulses.     Heart sounds: Normal heart sounds.     Comments: Lower midsternal chest wall tenderness to palpation without overlying skin changes Pulmonary:     Effort: Pulmonary effort is normal.      Breath sounds: Normal breath sounds.  Abdominal:     General: Abdomen is flat. There is no distension.     Palpations: Abdomen is soft.     Tenderness: There is no abdominal tenderness.  Musculoskeletal:        General: Normal range of motion.     Cervical back: Normal range of motion and neck supple.  Skin:    General: Skin is warm and dry.  Neurological:     General: No focal deficit present.     Mental Status: He is alert and oriented to person, place, and time.  Psychiatric:        Mood and Affect: Mood normal.        Behavior: Behavior normal.     ED Results / Procedures / Treatments   Labs (all labs ordered are listed, but only abnormal results are displayed) Labs Reviewed  COMPREHENSIVE METABOLIC PANEL - Abnormal; Notable for the following components:      Result Value   CO2 20 (*)    Glucose, Bld 188 (*)    BUN 22 (*)    Total Protein 8.5 (*)    Total Bilirubin 2.0 (*)    Anion gap 16 (*)    All other components within normal limits  SALICYLATE LEVEL - Abnormal; Notable for the following components:   Salicylate Lvl <7.0 (*)    All other components within normal limits  ACETAMINOPHEN LEVEL - Abnormal; Notable for the following components:   Acetaminophen (Tylenol), Serum <10 (*)    All other components within normal limits  CBC - Abnormal; Notable for the following components:   WBC 13.6 (*)    Hemoglobin 17.4 (*)    Platelets 420 (*)    All other components within normal limits  BETA-HYDROXYBUTYRIC ACID - Abnormal; Notable for the following components:   Beta-Hydroxybutyric Acid 0.58 (*)    All other components within normal limits  RESP PANEL BY RT-PCR (FLU A&B, COVID) ARPGX2  ETHANOL  LIPASE, BLOOD  PROTIME-INR  RAPID URINE DRUG SCREEN, HOSP PERFORMED  URINALYSIS, ROUTINE W REFLEX MICROSCOPIC  TROPONIN I (HIGH SENSITIVITY)    EKG EKG Interpretation  Date/Time:  Thursday March 09 2022 11:05:50 EDT Ventricular Rate:  84 PR Interval:  132 QRS  Duration: 94 QT Interval:  371 QTC Calculation: 439 R Axis:   60 Text Interpretation: Sinus rhythm Previous t wave inversion in V3 now resolved No significant change since last tracing Confirmed by Arturo Morton ([id number]) on 03/09/2022 1:27:34 PM  Radiology DG Chest 2 View  Result Date: 03/09/2022 CLINICAL DATA:  Chest pain and vomiting EXAM: CHEST - 2 VIEW COMPARISON:  10/31/2020 FINDINGS: Normal heart size  and mediastinal contours. No acute infiltrate or edema. No effusion or pneumothorax. No acute osseous findings. IMPRESSION: Negative chest. Electronically Signed   By: Tiburcio Pea M.D.   On: 03/09/2022 11:57    Procedures Procedures    Medications Ordered in ED Medications  dextrose 5 %-0.9 % sodium chloride infusion ( Intravenous New Bag/Given 03/09/22 1204)  LORazepam (ATIVAN) injection 0-4 mg ( Intravenous Not Given 03/09/22 1418)    Or  LORazepam (ATIVAN) tablet 0-4 mg ( Oral See Alternative 03/09/22 1418)  LORazepam (ATIVAN) injection 0-4 mg (has no administration in time range)    Or  LORazepam (ATIVAN) tablet 0-4 mg (has no administration in time range)  thiamine (VITAMIN B1) tablet 100 mg (has no administration in time range)    Or  thiamine (VITAMIN B1) injection 100 mg (has no administration in time range)  acetaminophen (TYLENOL) tablet 650 mg (has no administration in time range)  alum & mag hydroxide-simeth (MAALOX/MYLANTA) 200-200-20 MG/5ML suspension 30 mL (has no administration in time range)  ondansetron (ZOFRAN) tablet 4 mg (has no administration in time range)  ondansetron (ZOFRAN) injection 4 mg (4 mg Intravenous Given 03/09/22 1201)  famotidine (PEPCID) IVPB 20 mg premix (0 mg Intravenous Stopped 03/09/22 1235)  thiamine (VITAMIN B1) injection 100 mg (100 mg Intravenous Given 03/09/22 1201)  acetaminophen (TYLENOL) tablet 650 mg (650 mg Oral Given 03/09/22 1347)    ED Course/ Medical Decision Making/ A&P Clinical Course as of 03/09/22 1606  Thu Mar 09, 2022  1327 Labs reviewed and interpreted by myself, troponin is negative and EKG is without acute ischemic changes making ACS unlikely cause of his chest pain.  EKG shows no signs of arrhythmia causing his syncope.  He has mild dehydration which may be due to mild alcohol ketoacidosis with a gap of 16, however BHB is low at 0.58 making this less likely.  Patient's lipase is negative making pancreatitis unlikely and LFTs are within normal range.  Patient's chest x-ray shows no signs of acute disease to explain his pain.  Patient is medically cleared for psychiatric evaluation. [VK]  1330 Upon reassessment, the patient is asleep in bed resting comfortably.  He was easily arousable.  He states that his nausea has resolved and he still having some chest pain.  He will additionally be given Tylenol. [VK]  1556 Patient was evaluated by psychiatry and psychiatrically cleared. He was given outpatient follow up resources [VK]  1606 Patient states that he does not want to stop drinking alcohol at this time.  He will be given a prescription for Zofran as needed and thiamine.  He is given primary care clinic to follow-up with.  Patient reports improvement of his symptoms and states that his nausea improved and he has been able to tolerate ginger ale.  He is stable for discharge home and was given strict return precautions.  Patient is agreeable with plan. [VK]    Clinical Course User Index [VK] Phoebe Sharps, DO                           Medical Decision Making Patient is a 36 year old male with a past medical history of alcohol use disorder presenting to the emergency department with nausea, vomiting and syncope.  I am concerned for arrhythmia, electrolyte abnormality, dehydration, alcohol withdrawal, alcoholic ketoacidosis, esophageal tear perforation, pancreatitis or hepatitis or gastroenteritis.  Patient has no fevers or point tenderness in his abdomen making cholelithiasis or cholecystitis less  likely.  He  will be symptomatically treated and have labs, chest x-ray and EKG performed and will be closely reassessed.  Amount and/or Complexity of Data Reviewed Labs: ordered. Decision-making details documented in ED Course. Radiology: ordered. Decision-making details documented in ED Course. ECG/medicine tests: ordered. Decision-making details documented in ED Course.  Risk OTC drugs. Prescription drug management.           Final Clinical Impression(s) / ED Diagnoses Final diagnoses:  Suicidal ideation  Alcohol use disorder  Nausea and vomiting, unspecified vomiting type    Rx / DC Orders ED Discharge Orders          Ordered    ondansetron (ZOFRAN) 4 MG tablet  Every 6 hours        03/09/22 1603    thiamine (VITAMIN B1) 100 MG tablet  Daily        03/09/22 1603              [place], Herreid K, DO 03/09/22 1606

## 2022-03-09 NOTE — Discharge Instructions (Addendum)
You were seen in the emergency department for your nausea and vomiting as well as your suicidal thoughts.  You did not have any signs of severe alcohol withdrawal or severe dehydration.  We gave you fluids and medication in the emergency department with improvement of your symptoms.  You were seen by behavioral health counselor and cleared for outpatient treatment.  You should follow-up with primary care clinic for reassessment of your symptoms and to establish care.  For your behavioral health needs you are advised to follow up with [facility].  They offer psychiatry, therapy and substance use disorder counseling.  New patients are seen at their walk-in clinic.  Walk-in hours are Monday - Friday from 8:30 am - 12:00 pm, and from 1:00 pm - 2:30 pm.  Walk-in patients are seen on a first come, first served basis, so try to arrive as early as possible for the best chance of being seen the same day:       [facility]      [address]      [place], [place] [address]      [phone]

## 2022-08-16 ENCOUNTER — Emergency Department (HOSPITAL_COMMUNITY)
Admission: EM | Admit: 2022-08-16 | Discharge: 2022-08-16 | Discharge status: Against Medical Advice | Payer: Self-pay | Attending: Emergency Medicine | Admitting: Emergency Medicine

## 2022-08-16 ENCOUNTER — Other Ambulatory Visit: Payer: Self-pay

## 2022-08-16 ENCOUNTER — Encounter (HOSPITAL_COMMUNITY): Payer: Self-pay

## 2022-08-16 DIAGNOSIS — R519 Headache, unspecified: Secondary | ICD-10-CM | POA: Insufficient documentation

## 2022-08-16 DIAGNOSIS — R55 Syncope and collapse: Secondary | ICD-10-CM | POA: Insufficient documentation

## 2022-08-16 DIAGNOSIS — Z5321 Procedure and treatment not carried out due to patient leaving prior to being seen by health care provider: Secondary | ICD-10-CM | POA: Insufficient documentation

## 2022-08-16 DIAGNOSIS — R112 Nausea with vomiting, unspecified: Secondary | ICD-10-CM | POA: Insufficient documentation

## 2022-08-16 NOTE — ED Triage Notes (Signed)
Pt BIB [facility] from [facility]/t n/v since 0300, 2 syncopal evemst that were unwitnessed, then a 3rd syncopal event was witnessed by his neighbor. Now c/o 10/10 HA pain to Lt forehead. EMS gave zofran to 18g Lt hand PIV.Does have Hx of concussions. VSS.

## 2022-08-16 NOTE — ED Provider Triage Note (Cosign Needed)
Emergency Medicine Provider Triage Evaluation Note  Christopher Estes , a 37 y.o. male  was evaluated in triage.  Pt complains of nausea and vomiting since 3 AM.  Patient also reports he had 2 unwitnessed syncopal events and then a third syncopal event that was witnessed by his neighbor.  Now complaining of a headache and his left forehead.  He was brought by EMS and was given Zofran.  He reports he has these "episodes" every month or so.   Review of Systems  Positive: As above Negative: As above  Physical Exam  BP (!) 142/91 (BP Location: Right Arm)   Pulse 82   Temp 98.8 F (37.1 C) (Oral)   Resp 18   Ht 6' (1.829 m)   Wt 93.9 kg   SpO2 97%   BMI 28.07 kg/m  Gen:   Awake, no distress   Resp:  Normal effort  MSK:   Moves extremities without difficulty  Other:    Medical Decision Making  Medically screening exam initiated at 3:04 PM.  Appropriate orders placed.  Herold Langston was informed that the remainder of the evaluation will be completed by another provider, this initial triage assessment does not replace that evaluation, and the importance of remaining in the ED until their evaluation is complete.     [place], [place] R, [place] 08/16/22 1510

## 2022-10-11 ENCOUNTER — Ambulatory Visit
Admission: EM | Admit: 2022-10-11 | Discharge: 2022-10-11 | Disposition: A | Discharge status: Home / Self Care | Payer: Self-pay | Attending: Internal Medicine | Admitting: Internal Medicine

## 2022-10-11 ENCOUNTER — Ambulatory Visit (INDEPENDENT_AMBULATORY_CARE_PROVIDER_SITE_OTHER): Payer: Self-pay

## 2022-10-11 DIAGNOSIS — M25571 Pain in right ankle and joints of right foot: Secondary | ICD-10-CM

## 2022-10-11 DIAGNOSIS — M79671 Pain in right foot: Secondary | ICD-10-CM

## 2022-10-11 NOTE — ED Triage Notes (Signed)
Pt c/o right foot pain "extreme discomfort" the day after playing hockey 2 days ago.

## 2022-10-11 NOTE — Discharge Instructions (Signed)
Follow-up with orthopedist tomorrow to schedule appointment for further evaluation.  Apply ice, elevate extremity, nonweightbearing until otherwise advised.  Boot has been applied.

## 2022-10-11 NOTE — ED Provider Notes (Signed)
[name]    CSN: [id number] Arrival date & time: 10/11/22  1729      History   Chief Complaint Chief Complaint  Patient presents with   right foot pain    HPI Rusell Estes is a 37 y.o. male.   Patient presents with right posterior ankle pain that started yesterday.  Patient reports that he was playing hockey yesterday but denies any apparent injury.  He states that it started hurting afterwards.  Reports pain is present near the Achilles.  Pain with bearing weight is present.  He has not taken any medications to alleviate symptoms.  He is able to bear weight.  Denies numbness or tingling.     Past Medical History:  Diagnosis Date   Alcoholism ([place])    Hyperlipidemia    Insomnia    Migraine     Patient Active Problem List   Diagnosis Date Noted   Alcohol use disorder 03/09/2022   Tobacco dependence 08/05/2014   Hyperlipidemia 06/11/2014   IBS (irritable bowel syndrome) 06/11/2014    Past Surgical History:  Procedure Laterality Date   INNER EAR SURGERY     TONSILLECTOMY         Home Medications    Prior to Admission medications   Medication Sig Start Date End Date Taking? Authorizing Provider  famotidine (PEPCID) 20 MG tablet Take 1 tablet (20 mg total) by mouth 2 (two) times daily. Patient not taking: Reported on 03/09/2022 10/31/20 11/30/20  Wyvonnia Dusky, MD  ibuprofen (ADVIL) 800 MG tablet Take 1 tablet (800 mg total) by mouth 3 (three) times daily. Patient not taking: Reported on 03/09/2022 11/15/20   Raspet, Erin K, PA-C  ondansetron (ZOFRAN) 4 MG tablet Take 1 tablet (4 mg total) by mouth every 6 (six) hours. 03/09/22   Leanord Asal K, DO  pantoprazole (PROTONIX) 20 MG tablet Take 1 tablet (20 mg total) by mouth daily. Patient not taking: Reported on 03/09/2022 10/31/20 11/30/20  Wyvonnia Dusky, MD  thiamine (VITAMIN B1) 100 MG tablet Take 1 tablet (100 mg total) by mouth daily. 03/09/22   Kemper Durie, DO  metoCLOPramide  (REGLAN) 10 MG tablet Take 1 tablet (10 mg total) by mouth every 8 (eight) hours as needed for nausea or vomiting. Patient not taking: Reported on 11/20/2017 04/18/16 09/16/19  Jola Schmidt, MD    Family History Family History  Problem Relation Age of Onset   Diabetes Mother    Hypertension Mother    Asthma Mother    Cancer Father    Stroke Father    Thyroid disease Father     Social History Social History   Tobacco Use   Smoking status: Former    Packs/day: 0.00    Types: Cigarettes   Smokeless tobacco: Never   Tobacco comments:    5 cigarettes per day  Vaping Use   Vaping Use: Every day   Substances: Nicotine, Flavoring  Substance Use Topics   Alcohol use: Yes    Alcohol/week: 24.0 standard drinks of alcohol    Types: 24 Cans of beer per week    Comment: 4/7 days a week   Drug use: Yes    Types: Marijuana     Allergies   Dilaudid [hydromorphone hcl]   Review of Systems Review of Systems Per HPI  Physical Exam Triage Vital Signs ED Triage Vitals  Enc Vitals Group     BP 10/11/22 1737 (!) 157/104     Pulse Rate 10/11/22 1737 69  Resp 10/11/22 1737 16     Temp 10/11/22 1737 98 F (36.7 C)     Temp Source 10/11/22 1737 Oral     SpO2 10/11/22 1737 98 %     Weight --      Height --      Head Circumference --      Peak Flow --      Pain Score 10/11/22 1735 8     Pain Loc --      Pain Edu? --      Excl. in [place]? --    No data found.  Updated Vital Signs BP 133/86 (BP Location: Left Arm)   Pulse 69   Temp 98 F (36.7 C) (Oral)   Resp 16   SpO2 98%   Visual Acuity Right Eye Distance:   Left Eye Distance:   Bilateral Distance:    Right Eye Near:   Left Eye Near:    Bilateral Near:     Physical Exam Constitutional:      General: He is not in acute distress.    Appearance: Normal appearance. He is not toxic-appearing or diaphoretic.  HENT:     Head: Normocephalic and atraumatic.  Eyes:     Extraocular Movements: Extraocular movements  intact.     Conjunctiva/sclera: Conjunctivae normal.  Pulmonary:     Effort: Pulmonary effort is normal.  Musculoskeletal:     Comments: Patient has tenderness to palpation to posterior ankle and posterior heel.  Negative Thompson's test.  No obvious swelling noted.  No other tenderness to palpation to foot or ankle.  No discoloration noted.  Patient is neurovascularly intact.  Limited range of motion of ankle due to pain.  Neurological:     General: No focal deficit present.     Mental Status: He is alert and oriented to person, place, and time. Mental status is at baseline.  Psychiatric:        Mood and Affect: Mood normal.        Behavior: Behavior normal.        Thought Content: Thought content normal.        Judgment: Judgment normal.      UC Treatments / Results  Labs (all labs ordered are listed, but only abnormal results are displayed) Labs Reviewed - No data to display  EKG   Radiology DG Ankle Complete Right  Result Date: 10/11/2022 CLINICAL DATA:  Right foot pain after playing hockey 2 days ago. EXAM: RIGHT ANKLE - COMPLETE 3+ VIEW; RIGHT FOOT COMPLETE - 3+ VIEW COMPARISON:  None Available. FINDINGS: No acute fracture or dislocation. Smoothly marginated horizontal linear lucency through the medial cuneiform, likely bipartite morphology. The ankle mortise is symmetric. The talar dome is intact. No tibiotalar joint effusion. Joint spaces are preserved. Bone mineralization is normal. Soft tissues are unremarkable. IMPRESSION: 1. Horizontal linear lucency through the medial cuneiform is favored to reflect bipartite morphology. If the patient is point tender in this area, consider noncontrast CT or MRI to exclude fracture. Electronically Signed   By: Titus Dubin M.D.   On: 10/11/2022 18:24   DG Foot Complete Right  Result Date: 10/11/2022 CLINICAL DATA:  Right foot pain after playing hockey 2 days ago. EXAM: RIGHT ANKLE - COMPLETE 3+ VIEW; RIGHT FOOT COMPLETE - 3+ VIEW  COMPARISON:  None Available. FINDINGS: No acute fracture or dislocation. Smoothly marginated horizontal linear lucency through the medial cuneiform, likely bipartite morphology. The ankle mortise is symmetric. The talar dome is intact. No tibiotalar  joint effusion. Joint spaces are preserved. Bone mineralization is normal. Soft tissues are unremarkable. IMPRESSION: 1. Horizontal linear lucency through the medial cuneiform is favored to reflect bipartite morphology. If the patient is point tender in this area, consider noncontrast CT or MRI to exclude fracture. Electronically Signed   By: Titus Dubin M.D.   On: 10/11/2022 18:24    Procedures Procedures (including critical care time)  Medications Ordered in UC Medications - No data to display  Initial Impression / Assessment and Plan / UC Course  I have reviewed the triage vital signs and the nursing notes.  Pertinent labs & imaging results that were available during my care of the patient were reviewed by me and considered in my medical decision making (see chart for details).     X-ray showing possible fracture of the medial cuneiform of right foot.  Radiologist recommended CT or MRI imaging.  Do not think that emergent imaging is necessary.  Low suspicion for fracture in this area given that there is no point tenderness.  Possible Achilles injury but no obvious signs of rupture on physical exam.  Will apply a cam boot and advised patient nonweightbearing until otherwise advised by orthopedist.  Advised patient to follow-up with orthopedist for further evaluation and management and more advanced imaging.  Provided patient with contact information for orthopedics and advised him to follow-up tomorrow to schedule appointment for further evaluation.  Advised supportive care including ice application and safe over-the-counter pain relievers.  Patient verbalized understanding and was agreeable with plan. Final Clinical Impressions(s) / UC Diagnoses    Final diagnoses:  Right foot pain  Acute right ankle pain     Discharge Instructions      Follow-up with orthopedist tomorrow to schedule appointment for further evaluation.  Apply ice, elevate extremity, nonweightbearing until otherwise advised.  Boot has been applied.     ED Prescriptions   None    PDMP not reviewed this encounter.   Teodora Medici, [place] 10/11/22 1900

## 2022-11-13 ENCOUNTER — Other Ambulatory Visit: Payer: Self-pay

## 2022-11-13 ENCOUNTER — Emergency Department (HOSPITAL_COMMUNITY): Payer: Self-pay

## 2022-11-13 ENCOUNTER — Emergency Department (HOSPITAL_COMMUNITY)
Admission: EM | Admit: 2022-11-13 | Discharge: 2022-11-13 | Disposition: A | Discharge status: Home / Self Care | Payer: Self-pay | Attending: Emergency Medicine | Admitting: Emergency Medicine

## 2022-11-13 ENCOUNTER — Encounter (HOSPITAL_COMMUNITY): Payer: Self-pay

## 2022-11-13 DIAGNOSIS — R112 Nausea with vomiting, unspecified: Secondary | ICD-10-CM | POA: Insufficient documentation

## 2022-11-13 DIAGNOSIS — E860 Dehydration: Secondary | ICD-10-CM | POA: Insufficient documentation

## 2022-11-13 DIAGNOSIS — R569 Unspecified convulsions: Secondary | ICD-10-CM | POA: Insufficient documentation

## 2022-11-13 DIAGNOSIS — D72829 Elevated white blood cell count, unspecified: Secondary | ICD-10-CM | POA: Insufficient documentation

## 2022-11-13 DIAGNOSIS — F1290 Cannabis use, unspecified, uncomplicated: Secondary | ICD-10-CM | POA: Insufficient documentation

## 2022-11-13 LAB — ETHANOL: Alcohol, Ethyl (B): <10 mg/dL (ref <10)

## 2022-11-13 LAB — URINALYSIS, W/ REFLEX TO CULTURE (INFECTION SUSPECTED)
Bacteria, UA: NONE SEEN
Bilirubin Urine: NEGATIVE
Glucose, UA: NEGATIVE mg/dL
Ketones, ur: 5 mg/dL — AB
Leukocytes,Ua: NEGATIVE
Nitrite: NEGATIVE
Protein, ur: 30 mg/dL — AB
Specific Gravity, Urine: 1.021 (ref 1.005–1.030)
pH: 6 (ref 5.0–8.0)

## 2022-11-13 LAB — CBC WITH DIFFERENTIAL/PLATELET
Abs Immature Granulocytes: 0.14 10*3/uL — ABNORMAL HIGH (ref 0.00–0.07)
Basophils Absolute: 0.1 10*3/uL (ref 0.0–0.1)
Basophils Relative: 0 %
Eosinophils Absolute: 0.1 10*3/uL (ref 0.0–0.5)
Eosinophils Relative: 0 %
HCT: 48.3 % (ref 39.0–52.0)
Hemoglobin: 16.8 g/dL (ref 13.0–17.0)
Immature Granulocytes: 1 %
Lymphocytes Relative: 16 %
Lymphs Abs: 2.1 10*3/uL (ref 0.7–4.0)
MCH: 31.6 pg (ref 26.0–34.0)
MCHC: 34.8 g/dL (ref 30.0–36.0)
MCV: 90.8 fL (ref 80.0–100.0)
Monocytes Absolute: 0.9 10*3/uL (ref 0.1–1.0)
Monocytes Relative: 6 %
Neutro Abs: 10.5 10*3/uL — ABNORMAL HIGH (ref 1.7–7.7)
Neutrophils Relative %: 77 %
Platelets: 334 10*3/uL (ref 150–400)
RBC: 5.32 MIL/uL (ref 4.22–5.81)
RDW: 12.2 % (ref 11.5–15.5)
WBC: 13.7 10*3/uL — ABNORMAL HIGH (ref 4.0–10.5)
nRBC: 0 % (ref 0.0–0.2)

## 2022-11-13 LAB — RAPID URINE DRUG SCREEN, HOSP PERFORMED
Amphetamines: NOT DETECTED
Barbiturates: NOT DETECTED
Benzodiazepines: POSITIVE — AB
Cocaine: NOT DETECTED
Opiates: NOT DETECTED
Tetrahydrocannabinol: POSITIVE — AB

## 2022-11-13 LAB — COMPREHENSIVE METABOLIC PANEL
ALT: 29 U/L (ref 0–44)
AST: 28 U/L (ref 15–41)
Albumin: 4.4 g/dL (ref 3.5–5.0)
Alkaline Phosphatase: 50 U/L (ref 38–126)
Anion gap: 12 (ref 5–15)
BUN: 16 mg/dL (ref 6–20)
CO2: 22 mmol/L (ref 22–32)
Calcium: 9.5 mg/dL (ref 8.9–10.3)
Chloride: 102 mmol/L (ref 98–111)
Creatinine, Ser: 0.96 mg/dL (ref 0.61–1.24)
GFR, Estimated: >60 mL/min (ref >60)
Glucose, Bld: 109 mg/dL — ABNORMAL HIGH (ref 70–99)
Potassium: 3.5 mmol/L (ref 3.5–5.1)
Sodium: 136 mmol/L (ref 135–145)
Total Bilirubin: 1.3 mg/dL — ABNORMAL HIGH (ref 0.3–1.2)
Total Protein: 7.7 g/dL (ref 6.5–8.1)

## 2022-11-13 LAB — LIPASE, BLOOD: Lipase: 32 U/L (ref 11–51)

## 2022-11-13 MED ORDER — LACTATED RINGERS IV BOLUS
1000.0000 mL | Freq: Once | INTRAVENOUS | Status: AC
  Administered 2022-11-13: 1000 mL via INTRAVENOUS

## 2022-11-13 MED ORDER — ALUM & MAG HYDROXIDE-SIMETH 200-200-20 MG/5ML PO SUSP
30.0000 mL | Freq: Once | ORAL | Status: AC
  Administered 2022-11-13: 30 mL via ORAL
  Filled 2022-11-13: qty 30

## 2022-11-13 MED ORDER — FAMOTIDINE IN NACL 20-0.9 MG/50ML-% IV SOLN
20.0000 mg | Freq: Once | INTRAVENOUS | Status: AC
  Administered 2022-11-13: 20 mg via INTRAVENOUS
  Filled 2022-11-13: qty 50

## 2022-11-13 MED ORDER — IOHEXOL 350 MG/ML SOLN
75.0000 mL | Freq: Once | INTRAVENOUS | Status: AC | PRN
  Administered 2022-11-13: 75 mL via INTRAVENOUS

## 2022-11-13 MED ORDER — ONDANSETRON HCL 4 MG/2ML IJ SOLN
4.0000 mg | Freq: Once | INTRAMUSCULAR | Status: DC
  Filled 2022-11-13: qty 2

## 2022-11-13 MED ORDER — DROPERIDOL 2.5 MG/ML IJ SOLN
5.0000 mg | Freq: Once | INTRAMUSCULAR | Status: AC
  Administered 2022-11-13: 5 mg via INTRAVENOUS
  Filled 2022-11-13: qty 2

## 2022-11-13 MED ORDER — METOCLOPRAMIDE HCL 5 MG/ML IJ SOLN
10.0000 mg | Freq: Once | INTRAMUSCULAR | Status: AC
  Administered 2022-11-13: 10 mg via INTRAVENOUS
  Filled 2022-11-13: qty 2

## 2022-11-13 NOTE — Discharge Instructions (Addendum)
All your blood test today look normal.  Your scans were normal.  Continue to avoid alcohol and marijuana at this time.  Eat small frequent meals and avoid eating before you go to bed.  You can use Tums or Maalox as needed for acid reflux type symptoms.

## 2022-11-13 NOTE — ED Provider Notes (Signed)
[place] EMERGENCY DEPARTMENT AT [facility] Provider Note   CSN: [id number] Arrival date & time: 11/13/22  4010     History  Chief Complaint  Patient presents with   Seizures    Christopher Estes is a 37 y.o. male.  Patient is a 37 year old male with a history of migraines, recurrent episodes of nausea vomiting and prior heavy alcohol use who has not had any alcohol in the last 3 months presenting today with EMS due to witnessed seizures.  Patient's roommate reports that he has had a history of seizures in the last month or so and is supposed to be following up with neurology but has not.  Patient denies using any benzodiazepines regularly.  He reports that 2 days ago he started vomiting and having epigastric abdominal pain.  He has not had any diarrhea or fever.  He denies any chest pain or shortness of breath.  He feels very dehydrated and reports he really has not been able to hold anything down.  He does not take any seizure medication.  EMS reports that patient has had a total of 4 seizures during their care.  They describe it as a tonic-clonic type movement where his upper and lower extremities get rigid and jerk and he becomes unresponsive.  The shortest 1 lasted 20 seconds and longest 45 seconds.  He has received a total of 5 of IV Versed.  Patient has not had a postictal period.  He does have a history of marijuana use.  The history is provided by the patient and medical records.  Seizures      Home Medications Prior to Admission medications   Medication Sig Start Date End Date Taking? Authorizing Provider  famotidine (PEPCID) 20 MG tablet Take 1 tablet (20 mg total) by mouth 2 (two) times daily. Patient not taking: Reported on 03/09/2022 10/31/20 11/30/20  Terald Sleeper, MD  pantoprazole (PROTONIX) 20 MG tablet Take 1 tablet (20 mg total) by mouth daily. Patient not taking: Reported on 03/09/2022 10/31/20 11/30/20  Terald Sleeper, MD  thiamine (VITAMIN B1) 100 MG tablet  Take 1 tablet (100 mg total) by mouth daily. Patient not taking: Reported on 11/13/2022 03/09/22   Elayne Snare K, DO  metoCLOPramide (REGLAN) 10 MG tablet Take 1 tablet (10 mg total) by mouth every 8 (eight) hours as needed for nausea or vomiting. Patient not taking: Reported on 11/20/2017 04/18/16 09/16/19  Azalia Bilis, MD      Allergies    Dilaudid [hydromorphone hcl]    Review of Systems   Review of Systems  Neurological:  Positive for seizures.    Physical Exam Updated Vital Signs BP (!) 166/92   Pulse 68   Temp 97.9 F (36.6 C) (Oral)   Resp 17   SpO2 100%  Physical Exam Vitals and nursing note reviewed.  Constitutional:      General: He is not in acute distress.    Appearance: He is well-developed.  HENT:     Head: Normocephalic and atraumatic.  Eyes:     Conjunctiva/sclera: Conjunctivae normal.     Pupils: Pupils are equal, round, and reactive to light.  Cardiovascular:     Rate and Rhythm: Normal rate and regular rhythm.     Heart sounds: No murmur heard. Pulmonary:     Effort: Pulmonary effort is normal. No respiratory distress.     Breath sounds: Normal breath sounds. No wheezing or rales.  Abdominal:     General: There is no distension.  Palpations: Abdomen is soft.     Tenderness: There is abdominal tenderness in the epigastric area and periumbilical area. There is no guarding or rebound.  Musculoskeletal:        General: No tenderness. Normal range of motion.     Cervical back: Normal range of motion and neck supple.  Skin:    General: Skin is warm and dry.     Findings: No erythema or rash.  Neurological:     Mental Status: He is alert and oriented to person, place, and time. Mental status is at baseline.     Sensory: No sensory deficit.     Motor: No weakness.  Psychiatric:        Behavior: Behavior normal.     ED Results / Procedures / Treatments   Labs (all labs ordered are listed, but only abnormal results are displayed) Labs  Reviewed  CBC WITH DIFFERENTIAL/PLATELET - Abnormal; Notable for the following components:      Result Value   WBC 13.7 (*)    Neutro Abs 10.5 (*)    Abs Immature Granulocytes 0.14 (*)    All other components within normal limits  COMPREHENSIVE METABOLIC PANEL - Abnormal; Notable for the following components:   Glucose, Bld 109 (*)    Total Bilirubin 1.3 (*)    All other components within normal limits  URINALYSIS, W/ REFLEX TO CULTURE (INFECTION SUSPECTED) - Abnormal; Notable for the following components:   Hgb urine dipstick SMALL (*)    Ketones, ur 5 (*)    Protein, ur 30 (*)    All other components within normal limits  RAPID URINE DRUG SCREEN, HOSP PERFORMED - Abnormal; Notable for the following components:   Benzodiazepines POSITIVE (*)    Tetrahydrocannabinol POSITIVE (*)    All other components within normal limits  LIPASE, BLOOD  ETHANOL    EKG EKG Interpretation  Date/Time:  Monday November 13 2022 09:23:12 EDT Ventricular Rate:  69 PR Interval:  142 QRS Duration: 99 QT Interval:  408 QTC Calculation: 438 R Axis:   75 Text Interpretation: Sinus rhythm Baseline wander in lead(s) V1 No significant change since last tracing Confirmed by Gwyneth Sprout ([id number]) on 11/13/2022 9:25:28 AM  Radiology CT ABDOMEN PELVIS W CONTRAST  Result Date: 11/13/2022 CLINICAL DATA:  Abdominal pain, acute, nonlocalized. EXAM: CT ABDOMEN AND PELVIS WITH CONTRAST TECHNIQUE: Multidetector CT imaging of the abdomen and pelvis was performed using the standard protocol following bolus administration of intravenous contrast. RADIATION DOSE REDUCTION: This exam was performed according to the departmental dose-optimization program which includes automated exposure control, adjustment of the mA and/or kV according to patient size and/or use of iterative reconstruction technique. CONTRAST:  75mL OMNIPAQUE IOHEXOL 350 MG/ML SOLN COMPARISON:  CT abdomen/pelvis 04/06/2020. FINDINGS: Lower chest: No acute  abnormality. Hepatobiliary: No focal liver abnormality is seen. No gallstones, gallbladder wall thickening, or biliary dilatation. Pancreas: Unremarkable. No pancreatic ductal dilatation or surrounding inflammatory changes. Spleen: Normal. Adrenals/Urinary Tract: Adrenal glands are unremarkable. Kidneys are normal, without renal calculi, focal lesion, or hydronephrosis. Bladder is unremarkable for degree of distention. Stomach/Bowel: Normal stomach and duodenum. No dilated loops of small bowel. Normal appendix is visualized on coronal image 60 series 6. No bowel wall thickening or surrounding inflammation. Vascular/Lymphatic: No significant vascular findings are present. No enlarged abdominal or pelvic lymph nodes. Reproductive: Prostate is unremarkable. Other: No abdominal wall hernia or abnormality. No abdominopelvic ascites. Musculoskeletal: No acute or significant osseous findings. IMPRESSION: No acute abdominopelvic abnormality. Normal appendix. Electronically Signed  By: Orvan Falconer M.D.   On: 11/13/2022 12:51   CT Head Wo Contrast  Result Date: 11/13/2022 CLINICAL DATA:  Seizure, new onset with no history of trauma. EXAM: CT HEAD WITHOUT CONTRAST TECHNIQUE: Contiguous axial images were obtained from the base of the skull through the vertex without intravenous contrast. RADIATION DOSE REDUCTION: This exam was performed according to the departmental dose-optimization program which includes automated exposure control, adjustment of the mA and/or kV according to patient size and/or use of iterative reconstruction technique. COMPARISON:  07/08/2008 FINDINGS: Brain: No evidence of acute infarction, hemorrhage, hydrocephalus, extra-axial collection or mass lesion/mass effect. Vascular: No hyperdense vessel or unexpected calcification. Skull: Normal. Negative for fracture or focal lesion. Sinuses/Orbits: No acute finding. IMPRESSION: Normal head CT. Electronically Signed   By: Tiburcio Pea M.D.   On:  11/13/2022 10:15    Procedures Procedures    Medications Ordered in ED Medications  lactated ringers bolus 1,000 mL (0 mLs Intravenous Stopped 11/13/22 1018)  metoCLOPramide (REGLAN) injection 10 mg (10 mg Intravenous Given 11/13/22 0926)  famotidine (PEPCID) IVPB 20 mg premix (0 mg Intravenous Stopped 11/13/22 1014)  alum & mag hydroxide-simeth (MAALOX/MYLANTA) 200-200-20 MG/5ML suspension 30 mL (30 mLs Oral Given 11/13/22 1014)  droperidol (INAPSINE) 2.5 MG/ML injection 5 mg (5 mg Intravenous Given 11/13/22 1039)  lactated ringers bolus 1,000 mL (1,000 mLs Intravenous New Bag/Given 11/13/22 1213)  iohexol (OMNIPAQUE) 350 MG/ML injection 75 mL (75 mLs Intravenous Contrast Given 11/13/22 1242)    ED Course/ Medical Decision Making/ A&P                             Medical Decision Making Amount and/or Complexity of Data Reviewed Independent Historian: EMS External Data Reviewed: notes. Labs: ordered. Decision-making details documented in ED Course. Radiology: ordered and independent interpretation performed. Decision-making details documented in ED Course. ECG/medicine tests: ordered and independent interpretation performed. Decision-making details documented in ED Course.  Risk OTC drugs. Prescription drug management. Parenteral controlled substances.   Pt with multiple medical problems and comorbidities and presenting today with a complaint that caries a high risk for morbidity and mortality.  Here today due to multiple seizures but also complaining of nausea vomiting and abdominal pain.  Patient does have a history of recurrent abdominal pain and roommate reported he had a history of seizures however no medical records consistent with that diagnosis and patient is not on any antiepileptic's.  He is awake and alert and has no focal neurologic deficit at this time.  Concern for electrolyte abnormalities, dehydration, lowering seizure threshold versus space-occupying lesion or result of  marijuana use.  Also concern for possible acute abdominal pathology such as pancreatitis, cholecystitis or gastritis.  Patient has not had any alcohol for the last 3 weeks and does not take benzodiazepines with lower suspicion for withdrawal at this time.  He is already received 5 mg of Versed per EMS.  Currently not having any signs of seizures.  He is hypertensive but otherwise heart rate, oxygen saturation and temperature are normal.  Patient is still vomiting on exam.  He received Zofran and Reglan.  Will give IV fluids due to concern for dehydration.  I independently interpreted patient's EKG which was normal.  Labs and imaging are pending.  1:37 PM I independently interpreted patient's labs CBC with mild leukocytosis of 13 with normal hemoglobin and platelet count, UA with 5 ketones but no other significant findings, CMP within normal limits and  no anion gap.  Normal LFTs, lipase within normal limits, EtOH is negative.  I have independently visualized and interpreted pt's images today.  Head CT is negative.  Patient had several other of these episodes here however at this time low suspicion that patient is having seizures.  He is noted to stick his finger down his throat causing emesis continues to complain of pain in his stomach.  He does have a history of marijuana use and concern for cannabis hyperemesis syndrome.  He is having abdominal pain and despite Zofran and Reglan and Pepcid still complaining of pain.  He was given droperidol continues to complain of pain but has helped with some of the vomiting.  He is currently getting IV fluids.  Will get a CT of the abdomen pelvis for further evaluation.  1:37 PM CT scan is within normal limits.  On reevaluation patient is feeling better.  At this time feel that patient is stable for discharge and would not start antiepileptics as it is not clear that he was actually having seizures but will give a referral to neurology for further evaluation.  Patient  also given information for gastroenterology given he has had repeated bouts of nausea and vomiting.  He reports he has not smoked marijuana in 2 months now and does not feel that that is most likely the cause of why this is happening.  Encouraged him to a continue to avoid marijuana at this time at least for 6 to 8 months as it can take time for symptoms to improve.  At this time patient is stable for discharge.          Final Clinical Impression(s) / ED Diagnoses Final diagnoses:  Seizure-like activity  Dehydration  Nausea and vomiting, unspecified vomiting type    Rx / DC Orders ED Discharge Orders          Ordered    Ambulatory referral to Neurology       Comments: An appointment is requested in approximately: 2 weeks   11/13/22 1337              Gwyneth Sprout, MD 11/13/22 1337

## 2022-11-13 NOTE — ED Notes (Signed)
Got patient on the monitor did EKG shown to Dr Anitra Lauth patient is resting with call bell in reach

## 2022-11-13 NOTE — ED Triage Notes (Signed)
Pt BIB [facility] from home d/t a seizure lasted for 45 sec. 4 seziures w EMS en route. Hx seizure. Not on meds. Pt had total of 4 seizures last month. 5mg  IV Versed.
